# Patient Record
Sex: Female | Born: 1940
Health system: Southern US, Community
[De-identification: ages and names within clinical notes are randomized; demographics above are authoritative.]

## PROBLEM LIST (undated history)

## (undated) DIAGNOSIS — A0472 Enterocolitis due to Clostridium difficile, not specified as recurrent: Secondary | ICD-10-CM

## (undated) DIAGNOSIS — M810 Age-related osteoporosis without current pathological fracture: Secondary | ICD-10-CM

## (undated) DIAGNOSIS — D649 Anemia, unspecified: Secondary | ICD-10-CM

## (undated) DIAGNOSIS — R0600 Dyspnea, unspecified: Secondary | ICD-10-CM

## (undated) DIAGNOSIS — K759 Inflammatory liver disease, unspecified: Secondary | ICD-10-CM

## (undated) DIAGNOSIS — E785 Hyperlipidemia, unspecified: Secondary | ICD-10-CM

## (undated) DIAGNOSIS — T451X5A Adverse effect of antineoplastic and immunosuppressive drugs, initial encounter: Secondary | ICD-10-CM

## (undated) DIAGNOSIS — N390 Urinary tract infection, site not specified: Secondary | ICD-10-CM

## (undated) DIAGNOSIS — C50919 Malignant neoplasm of unspecified site of unspecified female breast: Secondary | ICD-10-CM

## (undated) DIAGNOSIS — Z923 Personal history of irradiation: Secondary | ICD-10-CM

## (undated) DIAGNOSIS — F329 Major depressive disorder, single episode, unspecified: Secondary | ICD-10-CM

## (undated) DIAGNOSIS — J449 Chronic obstructive pulmonary disease, unspecified: Secondary | ICD-10-CM

## (undated) DIAGNOSIS — M1990 Unspecified osteoarthritis, unspecified site: Secondary | ICD-10-CM

## (undated) DIAGNOSIS — G4700 Insomnia, unspecified: Secondary | ICD-10-CM

## (undated) DIAGNOSIS — F32A Depression, unspecified: Secondary | ICD-10-CM

## (undated) DIAGNOSIS — I10 Essential (primary) hypertension: Secondary | ICD-10-CM

## (undated) DIAGNOSIS — H269 Unspecified cataract: Secondary | ICD-10-CM

## (undated) DIAGNOSIS — F419 Anxiety disorder, unspecified: Secondary | ICD-10-CM

## (undated) DIAGNOSIS — G620 Drug-induced polyneuropathy: Secondary | ICD-10-CM

## (undated) HISTORY — DX: Essential (primary) hypertension: I10

## (undated) HISTORY — DX: Age-related osteoporosis without current pathological fracture: M81.0

## (undated) HISTORY — DX: Urinary tract infection, site not specified: N39.0

## (undated) HISTORY — PX: APPENDECTOMY: SHX54

## (undated) HISTORY — DX: Depression, unspecified: F32.A

## (undated) HISTORY — PX: MASTECTOMY: SHX3

## (undated) HISTORY — DX: Malignant neoplasm of unspecified site of unspecified female breast: C50.919

## (undated) HISTORY — DX: Major depressive disorder, single episode, unspecified: F32.9

## (undated) HISTORY — DX: Unspecified osteoarthritis, unspecified site: M19.90

## (undated) HISTORY — DX: Hyperlipidemia, unspecified: E78.5

## (undated) HISTORY — PX: EYE SURGERY: SHX253

## (undated) HISTORY — DX: Enterocolitis due to Clostridium difficile, not specified as recurrent: A04.72

## (undated) HISTORY — DX: Insomnia, unspecified: G47.00

## (undated) HISTORY — DX: Unspecified cataract: H26.9

---

## 1988-10-30 DIAGNOSIS — C50919 Malignant neoplasm of unspecified site of unspecified female breast: Secondary | ICD-10-CM

## 1988-10-30 HISTORY — PX: BREAST SURGERY: SHX581

## 1988-10-30 HISTORY — DX: Malignant neoplasm of unspecified site of unspecified female breast: C50.919

## 2011-06-01 DIAGNOSIS — F3340 Major depressive disorder, recurrent, in remission, unspecified: Secondary | ICD-10-CM | POA: Insufficient documentation

## 2011-06-01 DIAGNOSIS — F330 Major depressive disorder, recurrent, mild: Secondary | ICD-10-CM | POA: Insufficient documentation

## 2012-11-04 DIAGNOSIS — Z853 Personal history of malignant neoplasm of breast: Secondary | ICD-10-CM | POA: Insufficient documentation

## 2013-02-12 DIAGNOSIS — Z9889 Other specified postprocedural states: Secondary | ICD-10-CM | POA: Insufficient documentation

## 2014-03-26 ENCOUNTER — Ambulatory Visit: Payer: Self-pay | Admitting: Family Medicine

## 2014-07-27 ENCOUNTER — Ambulatory Visit: Payer: Self-pay | Admitting: Family Medicine

## 2014-08-17 ENCOUNTER — Ambulatory Visit: Payer: Self-pay | Admitting: Surgery

## 2014-08-25 ENCOUNTER — Ambulatory Visit: Payer: Self-pay | Admitting: Urology

## 2014-09-01 LAB — PATHOLOGY REPORT

## 2014-09-09 ENCOUNTER — Ambulatory Visit: Payer: Self-pay | Admitting: Internal Medicine

## 2014-09-09 LAB — PROTIME-INR
INR: 0.9
Prothrombin Time: 12.4 secs (ref 11.5–14.7)

## 2014-09-09 LAB — COMPREHENSIVE METABOLIC PANEL
ALK PHOS: 91 U/L
ALT: 22 U/L
ANION GAP: 7 (ref 7–16)
AST: 11 U/L — AB (ref 15–37)
Albumin: 4.1 g/dL (ref 3.4–5.0)
BUN: 18 mg/dL (ref 7–18)
Bilirubin,Total: 0.4 mg/dL (ref 0.2–1.0)
CREATININE: 0.72 mg/dL (ref 0.60–1.30)
Calcium, Total: 8.8 mg/dL (ref 8.5–10.1)
Chloride: 107 mmol/L (ref 98–107)
Co2: 27 mmol/L (ref 21–32)
EGFR (African American): >60
Glucose: 105 mg/dL — ABNORMAL HIGH (ref 65–99)
OSMOLALITY: 284 (ref 275–301)
Potassium: 4.4 mmol/L (ref 3.5–5.1)
Sodium: 141 mmol/L (ref 136–145)
Total Protein: 7.6 g/dL (ref 6.4–8.2)

## 2014-09-09 LAB — CBC CANCER CENTER
Basophil #: 0.1 x10 3/mm (ref 0.0–0.1)
Basophil %: 0.7 %
Eosinophil #: 0.2 x10 3/mm (ref 0.0–0.7)
Eosinophil %: 2.4 %
HCT: 40.8 % (ref 35.0–47.0)
HGB: 13.6 g/dL (ref 12.0–16.0)
Lymphocyte #: 1.8 x10 3/mm (ref 1.0–3.6)
Lymphocyte %: 21.4 %
MCH: 32.3 pg (ref 26.0–34.0)
MCHC: 33.4 g/dL (ref 32.0–36.0)
MCV: 97 fL (ref 80–100)
Monocyte #: 0.6 x10 3/mm (ref 0.2–0.9)
Monocyte %: 7.6 %
Neutrophil #: 5.7 x10 3/mm (ref 1.4–6.5)
Neutrophil %: 67.9 %
Platelet: 263 x10 3/mm (ref 150–440)
RBC: 4.22 10*6/uL (ref 3.80–5.20)
RDW: 13 % (ref 11.5–14.5)
WBC: 8.3 x10 3/mm (ref 3.6–11.0)

## 2014-09-09 LAB — APTT: ACTIVATED PTT: 26.7 s (ref 23.6–35.9)

## 2014-09-10 LAB — CANCER ANTIGEN 27.29: CA 27.29: 25.2 U/mL (ref 0.0–38.6)

## 2014-09-17 ENCOUNTER — Ambulatory Visit: Payer: Self-pay | Admitting: Anesthesiology

## 2014-09-17 ENCOUNTER — Ambulatory Visit: Payer: Self-pay | Admitting: Internal Medicine

## 2014-09-21 ENCOUNTER — Ambulatory Visit: Payer: Self-pay | Admitting: Surgery

## 2014-09-28 LAB — CBC CANCER CENTER
BASOS PCT: 0.6 %
Basophil #: 0.1 x10 3/mm (ref 0.0–0.1)
EOS PCT: 1.4 %
Eosinophil #: 0.1 x10 3/mm (ref 0.0–0.7)
HCT: 40.1 % (ref 35.0–47.0)
HGB: 13.5 g/dL (ref 12.0–16.0)
Lymphocyte #: 2.1 x10 3/mm (ref 1.0–3.6)
Lymphocyte %: 21.9 %
MCH: 32.2 pg (ref 26.0–34.0)
MCHC: 33.6 g/dL (ref 32.0–36.0)
MCV: 96 fL (ref 80–100)
Monocyte #: 0.9 x10 3/mm (ref 0.2–0.9)
Monocyte %: 9 %
NEUTROS ABS: 6.5 x10 3/mm (ref 1.4–6.5)
NEUTROS PCT: 67.1 %
Platelet: 308 x10 3/mm (ref 150–440)
RBC: 4.19 10*6/uL (ref 3.80–5.20)
RDW: 13.3 % (ref 11.5–14.5)
WBC: 9.7 x10 3/mm (ref 3.6–11.0)

## 2014-09-28 LAB — CREATININE, SERUM
CREATININE: 0.81 mg/dL (ref 0.60–1.30)
EGFR (African American): >60

## 2014-09-28 LAB — HEPATIC FUNCTION PANEL A (ARMC)
AST: 13 U/L — AB (ref 15–37)
Albumin: 4 g/dL (ref 3.4–5.0)
Alkaline Phosphatase: 90 U/L
BILIRUBIN DIRECT: 0.1 mg/dL (ref 0.0–0.2)
Bilirubin,Total: 0.4 mg/dL (ref 0.2–1.0)
SGPT (ALT): 24 U/L
Total Protein: 7.3 g/dL (ref 6.4–8.2)

## 2014-09-29 ENCOUNTER — Ambulatory Visit: Payer: Self-pay | Admitting: Internal Medicine

## 2014-10-05 LAB — CBC CANCER CENTER
Basophil #: 0 x10 3/mm (ref 0.0–0.1)
Basophil %: 1.8 %
Eosinophil #: 0.1 x10 3/mm (ref 0.0–0.7)
Eosinophil %: 11 %
HCT: 36.1 % (ref 35.0–47.0)
HGB: 12 g/dL (ref 12.0–16.0)
Lymphocyte #: 0.6 x10 3/mm — ABNORMAL LOW (ref 1.0–3.6)
Lymphocyte %: 66 %
MCH: 31.7 pg (ref 26.0–34.0)
MCHC: 33.1 g/dL (ref 32.0–36.0)
MCV: 96 fL (ref 80–100)
MONO ABS: 0 x10 3/mm — AB (ref 0.2–0.9)
MONOS PCT: 4 %
NEUTROS ABS: 0.1 x10 3/mm — AB (ref 1.4–6.5)
Neutrophil %: 17.2 %
PLATELETS: 108 x10 3/mm — AB (ref 150–440)
RBC: 3.77 10*6/uL — ABNORMAL LOW (ref 3.80–5.20)
RDW: 12.6 % (ref 11.5–14.5)
WBC: 0.9 x10 3/mm — AB (ref 3.6–11.0)

## 2014-10-12 LAB — CBC CANCER CENTER
BASOS ABS: 0 x10 3/mm (ref 0.0–0.1)
BASOS PCT: 0.4 %
Eosinophil #: 0 x10 3/mm (ref 0.0–0.7)
Eosinophil %: 0.4 %
HCT: 36 % (ref 35.0–47.0)
HGB: 11.8 g/dL — ABNORMAL LOW (ref 12.0–16.0)
LYMPHS PCT: 17.7 %
Lymphocyte #: 1.3 x10 3/mm (ref 1.0–3.6)
MCH: 32 pg (ref 26.0–34.0)
MCHC: 32.7 g/dL (ref 32.0–36.0)
MCV: 98 fL (ref 80–100)
Monocyte #: 0.6 x10 3/mm (ref 0.2–0.9)
Monocyte %: 8.6 %
NEUTROS PCT: 72.9 %
Neutrophil #: 5.4 x10 3/mm (ref 1.4–6.5)
PLATELETS: 320 x10 3/mm (ref 150–440)
RBC: 3.68 10*6/uL — AB (ref 3.80–5.20)
RDW: 12.5 % (ref 11.5–14.5)
WBC: 7.4 x10 3/mm (ref 3.6–11.0)

## 2014-10-12 LAB — BASIC METABOLIC PANEL
Anion Gap: 6 — ABNORMAL LOW (ref 7–16)
BUN: 12 mg/dL (ref 7–18)
CALCIUM: 8.5 mg/dL (ref 8.5–10.1)
CO2: 27 mmol/L (ref 21–32)
Chloride: 107 mmol/L (ref 98–107)
Creatinine: 0.81 mg/dL (ref 0.60–1.30)
EGFR (African American): >60
Glucose: 111 mg/dL — ABNORMAL HIGH (ref 65–99)
OSMOLALITY: 280 (ref 275–301)
POTASSIUM: 4 mmol/L (ref 3.5–5.1)
SODIUM: 140 mmol/L (ref 136–145)

## 2014-10-12 LAB — HEPATIC FUNCTION PANEL A (ARMC)
ALBUMIN: 3.7 g/dL (ref 3.4–5.0)
ALK PHOS: 88 U/L
ALT: 24 U/L
BILIRUBIN TOTAL: 0.2 mg/dL (ref 0.2–1.0)
Bilirubin, Direct: <0.1 mg/dL (ref 0.0–0.2)
SGOT(AST): 12 U/L — ABNORMAL LOW (ref 15–37)
TOTAL PROTEIN: 6.7 g/dL (ref 6.4–8.2)

## 2014-10-19 LAB — CBC CANCER CENTER
BASOS PCT: 1 %
Basophil #: 0.1 x10 3/mm (ref 0.0–0.1)
EOS ABS: 0 x10 3/mm (ref 0.0–0.7)
Eosinophil %: 0.3 %
HCT: 36.1 % (ref 35.0–47.0)
HGB: 12 g/dL (ref 12.0–16.0)
LYMPHS PCT: 22.3 %
Lymphocyte #: 1.4 x10 3/mm (ref 1.0–3.6)
MCH: 31.9 pg (ref 26.0–34.0)
MCHC: 33.3 g/dL (ref 32.0–36.0)
MCV: 96 fL (ref 80–100)
MONO ABS: 0.6 x10 3/mm (ref 0.2–0.9)
Monocyte %: 10.3 %
NEUTROS PCT: 66.1 %
Neutrophil #: 4.1 x10 3/mm (ref 1.4–6.5)
Platelet: 397 x10 3/mm (ref 150–440)
RBC: 3.76 10*6/uL — AB (ref 3.80–5.20)
RDW: 13.1 % (ref 11.5–14.5)
WBC: 6.2 x10 3/mm (ref 3.6–11.0)

## 2014-10-26 LAB — CBC CANCER CENTER
BASOS PCT: 2.4 %
Basophil #: 0 x10 3/mm (ref 0.0–0.1)
EOS PCT: 3.8 %
Eosinophil #: 0 x10 3/mm (ref 0.0–0.7)
HCT: 33.7 % — AB (ref 35.0–47.0)
HGB: 11.1 g/dL — ABNORMAL LOW (ref 12.0–16.0)
Lymphocyte #: 0.6 x10 3/mm — ABNORMAL LOW (ref 1.0–3.6)
Lymphocyte %: 57.8 %
MCH: 31.9 pg (ref 26.0–34.0)
MCHC: 33.1 g/dL (ref 32.0–36.0)
MCV: 96 fL (ref 80–100)
Monocyte #: 0.1 x10 3/mm — ABNORMAL LOW (ref 0.2–0.9)
Monocyte %: 6.1 %
Neutrophil #: 0.3 x10 3/mm — ABNORMAL LOW (ref 1.4–6.5)
Neutrophil %: 29.9 %
PLATELETS: 165 x10 3/mm (ref 150–440)
RBC: 3.5 10*6/uL — AB (ref 3.80–5.20)
RDW: 13.5 % (ref 11.5–14.5)
WBC: 1.1 x10 3/mm — AB (ref 3.6–11.0)

## 2014-10-30 ENCOUNTER — Ambulatory Visit: Payer: Self-pay | Admitting: Internal Medicine

## 2014-10-30 DIAGNOSIS — Z923 Personal history of irradiation: Secondary | ICD-10-CM

## 2014-10-30 HISTORY — DX: Personal history of irradiation: Z92.3

## 2014-10-30 HISTORY — PX: BREAST LUMPECTOMY: SHX2

## 2014-11-02 DIAGNOSIS — Z170 Estrogen receptor positive status [ER+]: Secondary | ICD-10-CM | POA: Diagnosis not present

## 2014-11-02 DIAGNOSIS — T451X5S Adverse effect of antineoplastic and immunosuppressive drugs, sequela: Secondary | ICD-10-CM | POA: Diagnosis not present

## 2014-11-02 DIAGNOSIS — Z79899 Other long term (current) drug therapy: Secondary | ICD-10-CM | POA: Diagnosis not present

## 2014-11-02 DIAGNOSIS — I10 Essential (primary) hypertension: Secondary | ICD-10-CM | POA: Diagnosis not present

## 2014-11-02 DIAGNOSIS — D649 Anemia, unspecified: Secondary | ICD-10-CM | POA: Diagnosis not present

## 2014-11-02 DIAGNOSIS — Z5111 Encounter for antineoplastic chemotherapy: Secondary | ICD-10-CM | POA: Diagnosis not present

## 2014-11-02 DIAGNOSIS — Z9011 Acquired absence of right breast and nipple: Secondary | ICD-10-CM | POA: Diagnosis not present

## 2014-11-02 DIAGNOSIS — Z853 Personal history of malignant neoplasm of breast: Secondary | ICD-10-CM | POA: Diagnosis not present

## 2014-11-02 DIAGNOSIS — M170 Bilateral primary osteoarthritis of knee: Secondary | ICD-10-CM | POA: Diagnosis not present

## 2014-11-02 DIAGNOSIS — D701 Agranulocytosis secondary to cancer chemotherapy: Secondary | ICD-10-CM | POA: Diagnosis not present

## 2014-11-02 DIAGNOSIS — E785 Hyperlipidemia, unspecified: Secondary | ICD-10-CM | POA: Diagnosis not present

## 2014-11-02 DIAGNOSIS — Z87891 Personal history of nicotine dependence: Secondary | ICD-10-CM | POA: Diagnosis not present

## 2014-11-02 DIAGNOSIS — C773 Secondary and unspecified malignant neoplasm of axilla and upper limb lymph nodes: Secondary | ICD-10-CM | POA: Diagnosis not present

## 2014-11-02 DIAGNOSIS — Z9221 Personal history of antineoplastic chemotherapy: Secondary | ICD-10-CM | POA: Diagnosis not present

## 2014-11-02 DIAGNOSIS — C50212 Malignant neoplasm of upper-inner quadrant of left female breast: Secondary | ICD-10-CM | POA: Diagnosis not present

## 2014-11-02 LAB — HEPATIC FUNCTION PANEL A (ARMC)
ALBUMIN: 3.9 g/dL (ref 3.4–5.0)
ALK PHOS: 92 U/L
BILIRUBIN DIRECT: 0.1 mg/dL (ref 0.0–0.2)
Bilirubin,Total: 0.2 mg/dL (ref 0.2–1.0)
SGOT(AST): 3 U/L — ABNORMAL LOW (ref 15–37)
SGPT (ALT): 10 U/L — ABNORMAL LOW
Total Protein: 7.1 g/dL (ref 6.4–8.2)

## 2014-11-02 LAB — CBC CANCER CENTER
Basophil #: 0.1 x10 3/mm (ref 0.0–0.1)
Basophil %: 0.8 %
EOS PCT: 1.4 %
Eosinophil #: 0.1 x10 3/mm (ref 0.0–0.7)
HCT: 36.1 % (ref 35.0–47.0)
HGB: 11.9 g/dL — ABNORMAL LOW (ref 12.0–16.0)
LYMPHS ABS: 1.5 x10 3/mm (ref 1.0–3.6)
LYMPHS PCT: 19.4 %
MCH: 31.9 pg (ref 26.0–34.0)
MCHC: 33.1 g/dL (ref 32.0–36.0)
MCV: 97 fL (ref 80–100)
MONOS PCT: 11.1 %
Monocyte #: 0.9 x10 3/mm (ref 0.2–0.9)
Neutrophil #: 5.2 x10 3/mm (ref 1.4–6.5)
Neutrophil %: 67.3 %
Platelet: 236 x10 3/mm (ref 150–440)
RBC: 3.74 10*6/uL — AB (ref 3.80–5.20)
RDW: 14 % (ref 11.5–14.5)
WBC: 7.7 x10 3/mm (ref 3.6–11.0)

## 2014-11-02 LAB — CREATININE, SERUM
CREATININE: 0.87 mg/dL (ref 0.60–1.30)
EGFR (African American): >60
EGFR (Non-African Amer.): >60

## 2014-11-09 DIAGNOSIS — Z170 Estrogen receptor positive status [ER+]: Secondary | ICD-10-CM | POA: Diagnosis not present

## 2014-11-09 DIAGNOSIS — Z9221 Personal history of antineoplastic chemotherapy: Secondary | ICD-10-CM | POA: Diagnosis not present

## 2014-11-09 DIAGNOSIS — Z79899 Other long term (current) drug therapy: Secondary | ICD-10-CM | POA: Diagnosis not present

## 2014-11-09 DIAGNOSIS — C773 Secondary and unspecified malignant neoplasm of axilla and upper limb lymph nodes: Secondary | ICD-10-CM | POA: Diagnosis not present

## 2014-11-09 DIAGNOSIS — Z5111 Encounter for antineoplastic chemotherapy: Secondary | ICD-10-CM | POA: Diagnosis not present

## 2014-11-09 DIAGNOSIS — C50212 Malignant neoplasm of upper-inner quadrant of left female breast: Secondary | ICD-10-CM | POA: Diagnosis not present

## 2014-11-09 DIAGNOSIS — M170 Bilateral primary osteoarthritis of knee: Secondary | ICD-10-CM | POA: Diagnosis not present

## 2014-11-09 DIAGNOSIS — D701 Agranulocytosis secondary to cancer chemotherapy: Secondary | ICD-10-CM | POA: Diagnosis not present

## 2014-11-09 DIAGNOSIS — T451X5S Adverse effect of antineoplastic and immunosuppressive drugs, sequela: Secondary | ICD-10-CM | POA: Diagnosis not present

## 2014-11-09 DIAGNOSIS — D649 Anemia, unspecified: Secondary | ICD-10-CM | POA: Diagnosis not present

## 2014-11-09 DIAGNOSIS — I10 Essential (primary) hypertension: Secondary | ICD-10-CM | POA: Diagnosis not present

## 2014-11-09 DIAGNOSIS — Z853 Personal history of malignant neoplasm of breast: Secondary | ICD-10-CM | POA: Diagnosis not present

## 2014-11-09 DIAGNOSIS — E785 Hyperlipidemia, unspecified: Secondary | ICD-10-CM | POA: Diagnosis not present

## 2014-11-09 DIAGNOSIS — Z9011 Acquired absence of right breast and nipple: Secondary | ICD-10-CM | POA: Diagnosis not present

## 2014-11-09 DIAGNOSIS — Z87891 Personal history of nicotine dependence: Secondary | ICD-10-CM | POA: Diagnosis not present

## 2014-11-09 LAB — CBC CANCER CENTER
BASOS ABS: 0.1 x10 3/mm (ref 0.0–0.1)
Basophil %: 1.2 %
Eosinophil #: 0 x10 3/mm (ref 0.0–0.7)
Eosinophil %: 0.5 %
HCT: 35.4 % (ref 35.0–47.0)
HGB: 11.7 g/dL — ABNORMAL LOW (ref 12.0–16.0)
LYMPHS ABS: 1 x10 3/mm (ref 1.0–3.6)
Lymphocyte %: 17.8 %
MCH: 32.2 pg (ref 26.0–34.0)
MCHC: 33.2 g/dL (ref 32.0–36.0)
MCV: 97 fL (ref 80–100)
MONO ABS: 0.9 x10 3/mm (ref 0.2–0.9)
MONOS PCT: 15.5 %
NEUTROS PCT: 65 %
Neutrophil #: 3.8 x10 3/mm (ref 1.4–6.5)
Platelet: 388 x10 3/mm (ref 150–440)
RBC: 3.64 10*6/uL — AB (ref 3.80–5.20)
RDW: 15.2 % — AB (ref 11.5–14.5)
WBC: 5.9 x10 3/mm (ref 3.6–11.0)

## 2014-11-10 DIAGNOSIS — M170 Bilateral primary osteoarthritis of knee: Secondary | ICD-10-CM | POA: Diagnosis not present

## 2014-11-10 DIAGNOSIS — I10 Essential (primary) hypertension: Secondary | ICD-10-CM | POA: Diagnosis not present

## 2014-11-10 DIAGNOSIS — T451X5S Adverse effect of antineoplastic and immunosuppressive drugs, sequela: Secondary | ICD-10-CM | POA: Diagnosis not present

## 2014-11-10 DIAGNOSIS — Z9221 Personal history of antineoplastic chemotherapy: Secondary | ICD-10-CM | POA: Diagnosis not present

## 2014-11-10 DIAGNOSIS — D649 Anemia, unspecified: Secondary | ICD-10-CM | POA: Diagnosis not present

## 2014-11-10 DIAGNOSIS — E785 Hyperlipidemia, unspecified: Secondary | ICD-10-CM | POA: Diagnosis not present

## 2014-11-10 DIAGNOSIS — D701 Agranulocytosis secondary to cancer chemotherapy: Secondary | ICD-10-CM | POA: Diagnosis not present

## 2014-11-10 DIAGNOSIS — Z170 Estrogen receptor positive status [ER+]: Secondary | ICD-10-CM | POA: Diagnosis not present

## 2014-11-10 DIAGNOSIS — Z5111 Encounter for antineoplastic chemotherapy: Secondary | ICD-10-CM | POA: Diagnosis not present

## 2014-11-10 DIAGNOSIS — Z9011 Acquired absence of right breast and nipple: Secondary | ICD-10-CM | POA: Diagnosis not present

## 2014-11-10 DIAGNOSIS — C773 Secondary and unspecified malignant neoplasm of axilla and upper limb lymph nodes: Secondary | ICD-10-CM | POA: Diagnosis not present

## 2014-11-10 DIAGNOSIS — Z79899 Other long term (current) drug therapy: Secondary | ICD-10-CM | POA: Diagnosis not present

## 2014-11-10 DIAGNOSIS — Z87891 Personal history of nicotine dependence: Secondary | ICD-10-CM | POA: Diagnosis not present

## 2014-11-10 DIAGNOSIS — C50212 Malignant neoplasm of upper-inner quadrant of left female breast: Secondary | ICD-10-CM | POA: Diagnosis not present

## 2014-11-10 DIAGNOSIS — Z853 Personal history of malignant neoplasm of breast: Secondary | ICD-10-CM | POA: Diagnosis not present

## 2014-11-16 DIAGNOSIS — Z9221 Personal history of antineoplastic chemotherapy: Secondary | ICD-10-CM | POA: Diagnosis not present

## 2014-11-16 DIAGNOSIS — T451X5S Adverse effect of antineoplastic and immunosuppressive drugs, sequela: Secondary | ICD-10-CM | POA: Diagnosis not present

## 2014-11-16 DIAGNOSIS — D649 Anemia, unspecified: Secondary | ICD-10-CM | POA: Diagnosis not present

## 2014-11-16 DIAGNOSIS — Z170 Estrogen receptor positive status [ER+]: Secondary | ICD-10-CM | POA: Diagnosis not present

## 2014-11-16 DIAGNOSIS — Z9011 Acquired absence of right breast and nipple: Secondary | ICD-10-CM | POA: Diagnosis not present

## 2014-11-16 DIAGNOSIS — Z87891 Personal history of nicotine dependence: Secondary | ICD-10-CM | POA: Diagnosis not present

## 2014-11-16 DIAGNOSIS — C773 Secondary and unspecified malignant neoplasm of axilla and upper limb lymph nodes: Secondary | ICD-10-CM | POA: Diagnosis not present

## 2014-11-16 DIAGNOSIS — Z853 Personal history of malignant neoplasm of breast: Secondary | ICD-10-CM | POA: Diagnosis not present

## 2014-11-16 DIAGNOSIS — E785 Hyperlipidemia, unspecified: Secondary | ICD-10-CM | POA: Diagnosis not present

## 2014-11-16 DIAGNOSIS — M170 Bilateral primary osteoarthritis of knee: Secondary | ICD-10-CM | POA: Diagnosis not present

## 2014-11-16 DIAGNOSIS — I10 Essential (primary) hypertension: Secondary | ICD-10-CM | POA: Diagnosis not present

## 2014-11-16 DIAGNOSIS — C50212 Malignant neoplasm of upper-inner quadrant of left female breast: Secondary | ICD-10-CM | POA: Diagnosis not present

## 2014-11-16 DIAGNOSIS — D701 Agranulocytosis secondary to cancer chemotherapy: Secondary | ICD-10-CM | POA: Diagnosis not present

## 2014-11-16 DIAGNOSIS — Z79899 Other long term (current) drug therapy: Secondary | ICD-10-CM | POA: Diagnosis not present

## 2014-11-16 DIAGNOSIS — Z5111 Encounter for antineoplastic chemotherapy: Secondary | ICD-10-CM | POA: Diagnosis not present

## 2014-11-16 LAB — CBC CANCER CENTER
Basophil #: 0 x10 3/mm (ref 0.0–0.1)
Basophil %: 2.4 %
EOS PCT: 5.8 %
Eosinophil #: 0 x10 3/mm (ref 0.0–0.7)
HCT: 29.5 % — AB (ref 35.0–47.0)
HGB: 9.9 g/dL — AB (ref 12.0–16.0)
Lymphocyte #: 0.5 x10 3/mm — ABNORMAL LOW (ref 1.0–3.6)
Lymphocyte %: 63.7 %
MCH: 33 pg (ref 26.0–34.0)
MCHC: 33.6 g/dL (ref 32.0–36.0)
MCV: 98 fL (ref 80–100)
MONO ABS: 0 x10 3/mm — AB (ref 0.2–0.9)
Monocyte %: 5.8 %
NEUTROS ABS: 0.2 x10 3/mm — AB (ref 1.4–6.5)
NEUTROS PCT: 22.3 %
Platelet: 131 x10 3/mm — ABNORMAL LOW (ref 150–440)
RBC: 2.99 10*6/uL — ABNORMAL LOW (ref 3.80–5.20)
RDW: 14.9 % — ABNORMAL HIGH (ref 11.5–14.5)
WBC: 0.9 x10 3/mm — CL (ref 3.6–11.0)

## 2014-11-23 DIAGNOSIS — C773 Secondary and unspecified malignant neoplasm of axilla and upper limb lymph nodes: Secondary | ICD-10-CM | POA: Diagnosis not present

## 2014-11-23 DIAGNOSIS — Z79899 Other long term (current) drug therapy: Secondary | ICD-10-CM | POA: Diagnosis not present

## 2014-11-23 DIAGNOSIS — Z853 Personal history of malignant neoplasm of breast: Secondary | ICD-10-CM | POA: Diagnosis not present

## 2014-11-23 DIAGNOSIS — Z9011 Acquired absence of right breast and nipple: Secondary | ICD-10-CM | POA: Diagnosis not present

## 2014-11-23 DIAGNOSIS — D649 Anemia, unspecified: Secondary | ICD-10-CM | POA: Diagnosis not present

## 2014-11-23 DIAGNOSIS — M170 Bilateral primary osteoarthritis of knee: Secondary | ICD-10-CM | POA: Diagnosis not present

## 2014-11-23 DIAGNOSIS — Z87891 Personal history of nicotine dependence: Secondary | ICD-10-CM | POA: Diagnosis not present

## 2014-11-23 DIAGNOSIS — D701 Agranulocytosis secondary to cancer chemotherapy: Secondary | ICD-10-CM | POA: Diagnosis not present

## 2014-11-23 DIAGNOSIS — Z5111 Encounter for antineoplastic chemotherapy: Secondary | ICD-10-CM | POA: Diagnosis not present

## 2014-11-23 DIAGNOSIS — C50212 Malignant neoplasm of upper-inner quadrant of left female breast: Secondary | ICD-10-CM | POA: Diagnosis not present

## 2014-11-23 DIAGNOSIS — I10 Essential (primary) hypertension: Secondary | ICD-10-CM | POA: Diagnosis not present

## 2014-11-23 DIAGNOSIS — Z170 Estrogen receptor positive status [ER+]: Secondary | ICD-10-CM | POA: Diagnosis not present

## 2014-11-23 DIAGNOSIS — T451X5S Adverse effect of antineoplastic and immunosuppressive drugs, sequela: Secondary | ICD-10-CM | POA: Diagnosis not present

## 2014-11-23 DIAGNOSIS — E785 Hyperlipidemia, unspecified: Secondary | ICD-10-CM | POA: Diagnosis not present

## 2014-11-23 DIAGNOSIS — Z9221 Personal history of antineoplastic chemotherapy: Secondary | ICD-10-CM | POA: Diagnosis not present

## 2014-11-23 LAB — CBC CANCER CENTER
Basophil #: 0.1 x10 3/mm (ref 0.0–0.1)
Basophil %: 0.6 %
EOS PCT: 0.7 %
Eosinophil #: 0.1 x10 3/mm (ref 0.0–0.7)
HCT: 32.7 % — AB (ref 35.0–47.0)
HGB: 10.9 g/dL — ABNORMAL LOW (ref 12.0–16.0)
LYMPHS PCT: 12.5 %
Lymphocyte #: 1.1 x10 3/mm (ref 1.0–3.6)
MCH: 32.8 pg (ref 26.0–34.0)
MCHC: 33.3 g/dL (ref 32.0–36.0)
MCV: 98 fL (ref 80–100)
MONOS PCT: 11.4 %
Monocyte #: 1 x10 3/mm — ABNORMAL HIGH (ref 0.2–0.9)
NEUTROS ABS: 6.9 x10 3/mm — AB (ref 1.4–6.5)
Neutrophil %: 74.8 %
Platelet: 297 x10 3/mm (ref 150–440)
RBC: 3.32 10*6/uL — ABNORMAL LOW (ref 3.80–5.20)
RDW: 15.7 % — ABNORMAL HIGH (ref 11.5–14.5)
WBC: 9.2 x10 3/mm (ref 3.6–11.0)

## 2014-11-23 LAB — HEPATIC FUNCTION PANEL A (ARMC)
ALBUMIN: 3.7 g/dL (ref 3.4–5.0)
ALK PHOS: 85 U/L
BILIRUBIN DIRECT: 0.1 mg/dL (ref 0.0–0.2)
BILIRUBIN TOTAL: 0.3 mg/dL (ref 0.2–1.0)
SGOT(AST): 10 U/L — ABNORMAL LOW (ref 15–37)
SGPT (ALT): 22 U/L
TOTAL PROTEIN: 7 g/dL (ref 6.4–8.2)

## 2014-11-23 LAB — CREATININE, SERUM
CREATININE: 0.75 mg/dL (ref 0.60–1.30)
EGFR (African American): >60
EGFR (Non-African Amer.): >60

## 2014-11-30 ENCOUNTER — Ambulatory Visit: Payer: Self-pay | Admitting: Internal Medicine

## 2014-12-02 DIAGNOSIS — Z853 Personal history of malignant neoplasm of breast: Secondary | ICD-10-CM | POA: Diagnosis not present

## 2014-12-02 DIAGNOSIS — C50212 Malignant neoplasm of upper-inner quadrant of left female breast: Secondary | ICD-10-CM | POA: Diagnosis not present

## 2014-12-02 DIAGNOSIS — Z9221 Personal history of antineoplastic chemotherapy: Secondary | ICD-10-CM | POA: Diagnosis not present

## 2014-12-02 DIAGNOSIS — D649 Anemia, unspecified: Secondary | ICD-10-CM | POA: Diagnosis not present

## 2014-12-02 DIAGNOSIS — T451X5S Adverse effect of antineoplastic and immunosuppressive drugs, sequela: Secondary | ICD-10-CM | POA: Diagnosis not present

## 2014-12-02 DIAGNOSIS — Z79899 Other long term (current) drug therapy: Secondary | ICD-10-CM | POA: Diagnosis not present

## 2014-12-02 DIAGNOSIS — C773 Secondary and unspecified malignant neoplasm of axilla and upper limb lymph nodes: Secondary | ICD-10-CM | POA: Diagnosis not present

## 2014-12-02 DIAGNOSIS — M170 Bilateral primary osteoarthritis of knee: Secondary | ICD-10-CM | POA: Diagnosis not present

## 2014-12-02 DIAGNOSIS — I10 Essential (primary) hypertension: Secondary | ICD-10-CM | POA: Diagnosis not present

## 2014-12-02 DIAGNOSIS — Z5111 Encounter for antineoplastic chemotherapy: Secondary | ICD-10-CM | POA: Diagnosis not present

## 2014-12-02 DIAGNOSIS — K1231 Oral mucositis (ulcerative) due to antineoplastic therapy: Secondary | ICD-10-CM | POA: Diagnosis not present

## 2014-12-02 DIAGNOSIS — Z170 Estrogen receptor positive status [ER+]: Secondary | ICD-10-CM | POA: Diagnosis not present

## 2014-12-02 DIAGNOSIS — Z9011 Acquired absence of right breast and nipple: Secondary | ICD-10-CM | POA: Diagnosis not present

## 2014-12-02 DIAGNOSIS — E785 Hyperlipidemia, unspecified: Secondary | ICD-10-CM | POA: Diagnosis not present

## 2014-12-02 DIAGNOSIS — D701 Agranulocytosis secondary to cancer chemotherapy: Secondary | ICD-10-CM | POA: Diagnosis not present

## 2014-12-02 DIAGNOSIS — Z87891 Personal history of nicotine dependence: Secondary | ICD-10-CM | POA: Diagnosis not present

## 2014-12-02 LAB — CBC CANCER CENTER
Basophil #: 0.1 x10 3/mm (ref 0.0–0.1)
Basophil %: 2 %
Eosinophil #: 0 x10 3/mm (ref 0.0–0.7)
Eosinophil %: 0.4 %
HCT: 34.9 % — ABNORMAL LOW (ref 35.0–47.0)
HGB: 11.7 g/dL — ABNORMAL LOW (ref 12.0–16.0)
Lymphocyte #: 1 x10 3/mm (ref 1.0–3.6)
Lymphocyte %: 15.5 %
MCH: 33.6 pg (ref 26.0–34.0)
MCHC: 33.5 g/dL (ref 32.0–36.0)
MCV: 101 fL — ABNORMAL HIGH (ref 80–100)
MONOS PCT: 13.5 %
Monocyte #: 0.9 x10 3/mm (ref 0.2–0.9)
Neutrophil #: 4.6 x10 3/mm (ref 1.4–6.5)
Neutrophil %: 68.6 %
PLATELETS: 423 x10 3/mm (ref 150–440)
RBC: 3.48 10*6/uL — AB (ref 3.80–5.20)
RDW: 17.1 % — ABNORMAL HIGH (ref 11.5–14.5)
WBC: 6.8 x10 3/mm (ref 3.6–11.0)

## 2014-12-03 DIAGNOSIS — D649 Anemia, unspecified: Secondary | ICD-10-CM | POA: Diagnosis not present

## 2014-12-03 DIAGNOSIS — Z853 Personal history of malignant neoplasm of breast: Secondary | ICD-10-CM | POA: Diagnosis not present

## 2014-12-03 DIAGNOSIS — D701 Agranulocytosis secondary to cancer chemotherapy: Secondary | ICD-10-CM | POA: Diagnosis not present

## 2014-12-03 DIAGNOSIS — M170 Bilateral primary osteoarthritis of knee: Secondary | ICD-10-CM | POA: Diagnosis not present

## 2014-12-03 DIAGNOSIS — C50212 Malignant neoplasm of upper-inner quadrant of left female breast: Secondary | ICD-10-CM | POA: Diagnosis not present

## 2014-12-03 DIAGNOSIS — Z5111 Encounter for antineoplastic chemotherapy: Secondary | ICD-10-CM | POA: Diagnosis not present

## 2014-12-03 DIAGNOSIS — Z170 Estrogen receptor positive status [ER+]: Secondary | ICD-10-CM | POA: Diagnosis not present

## 2014-12-03 DIAGNOSIS — Z9221 Personal history of antineoplastic chemotherapy: Secondary | ICD-10-CM | POA: Diagnosis not present

## 2014-12-03 DIAGNOSIS — I10 Essential (primary) hypertension: Secondary | ICD-10-CM | POA: Diagnosis not present

## 2014-12-03 DIAGNOSIS — T451X5S Adverse effect of antineoplastic and immunosuppressive drugs, sequela: Secondary | ICD-10-CM | POA: Diagnosis not present

## 2014-12-03 DIAGNOSIS — E785 Hyperlipidemia, unspecified: Secondary | ICD-10-CM | POA: Diagnosis not present

## 2014-12-03 DIAGNOSIS — Z79899 Other long term (current) drug therapy: Secondary | ICD-10-CM | POA: Diagnosis not present

## 2014-12-03 DIAGNOSIS — C773 Secondary and unspecified malignant neoplasm of axilla and upper limb lymph nodes: Secondary | ICD-10-CM | POA: Diagnosis not present

## 2014-12-03 DIAGNOSIS — Z87891 Personal history of nicotine dependence: Secondary | ICD-10-CM | POA: Diagnosis not present

## 2014-12-03 DIAGNOSIS — K1231 Oral mucositis (ulcerative) due to antineoplastic therapy: Secondary | ICD-10-CM | POA: Diagnosis not present

## 2014-12-03 DIAGNOSIS — Z9011 Acquired absence of right breast and nipple: Secondary | ICD-10-CM | POA: Diagnosis not present

## 2014-12-09 DIAGNOSIS — C50212 Malignant neoplasm of upper-inner quadrant of left female breast: Secondary | ICD-10-CM | POA: Diagnosis not present

## 2014-12-09 DIAGNOSIS — Z853 Personal history of malignant neoplasm of breast: Secondary | ICD-10-CM | POA: Diagnosis not present

## 2014-12-09 DIAGNOSIS — D649 Anemia, unspecified: Secondary | ICD-10-CM | POA: Diagnosis not present

## 2014-12-09 DIAGNOSIS — Z9221 Personal history of antineoplastic chemotherapy: Secondary | ICD-10-CM | POA: Diagnosis not present

## 2014-12-09 DIAGNOSIS — Z170 Estrogen receptor positive status [ER+]: Secondary | ICD-10-CM | POA: Diagnosis not present

## 2014-12-09 DIAGNOSIS — D701 Agranulocytosis secondary to cancer chemotherapy: Secondary | ICD-10-CM | POA: Diagnosis not present

## 2014-12-09 DIAGNOSIS — Z87891 Personal history of nicotine dependence: Secondary | ICD-10-CM | POA: Diagnosis not present

## 2014-12-09 DIAGNOSIS — K1231 Oral mucositis (ulcerative) due to antineoplastic therapy: Secondary | ICD-10-CM | POA: Diagnosis not present

## 2014-12-09 DIAGNOSIS — M170 Bilateral primary osteoarthritis of knee: Secondary | ICD-10-CM | POA: Diagnosis not present

## 2014-12-09 DIAGNOSIS — T451X5S Adverse effect of antineoplastic and immunosuppressive drugs, sequela: Secondary | ICD-10-CM | POA: Diagnosis not present

## 2014-12-09 DIAGNOSIS — I10 Essential (primary) hypertension: Secondary | ICD-10-CM | POA: Diagnosis not present

## 2014-12-09 DIAGNOSIS — Z5111 Encounter for antineoplastic chemotherapy: Secondary | ICD-10-CM | POA: Diagnosis not present

## 2014-12-09 DIAGNOSIS — E785 Hyperlipidemia, unspecified: Secondary | ICD-10-CM | POA: Diagnosis not present

## 2014-12-09 DIAGNOSIS — C773 Secondary and unspecified malignant neoplasm of axilla and upper limb lymph nodes: Secondary | ICD-10-CM | POA: Diagnosis not present

## 2014-12-09 DIAGNOSIS — Z9011 Acquired absence of right breast and nipple: Secondary | ICD-10-CM | POA: Diagnosis not present

## 2014-12-09 DIAGNOSIS — Z79899 Other long term (current) drug therapy: Secondary | ICD-10-CM | POA: Diagnosis not present

## 2014-12-15 DIAGNOSIS — D701 Agranulocytosis secondary to cancer chemotherapy: Secondary | ICD-10-CM | POA: Diagnosis not present

## 2014-12-15 DIAGNOSIS — Z170 Estrogen receptor positive status [ER+]: Secondary | ICD-10-CM | POA: Diagnosis not present

## 2014-12-15 DIAGNOSIS — Z9221 Personal history of antineoplastic chemotherapy: Secondary | ICD-10-CM | POA: Diagnosis not present

## 2014-12-15 DIAGNOSIS — Z853 Personal history of malignant neoplasm of breast: Secondary | ICD-10-CM | POA: Diagnosis not present

## 2014-12-15 DIAGNOSIS — I10 Essential (primary) hypertension: Secondary | ICD-10-CM | POA: Diagnosis not present

## 2014-12-15 DIAGNOSIS — M170 Bilateral primary osteoarthritis of knee: Secondary | ICD-10-CM | POA: Diagnosis not present

## 2014-12-15 DIAGNOSIS — T451X5S Adverse effect of antineoplastic and immunosuppressive drugs, sequela: Secondary | ICD-10-CM | POA: Diagnosis not present

## 2014-12-15 DIAGNOSIS — Z79899 Other long term (current) drug therapy: Secondary | ICD-10-CM | POA: Diagnosis not present

## 2014-12-15 DIAGNOSIS — Z87891 Personal history of nicotine dependence: Secondary | ICD-10-CM | POA: Diagnosis not present

## 2014-12-15 DIAGNOSIS — E785 Hyperlipidemia, unspecified: Secondary | ICD-10-CM | POA: Diagnosis not present

## 2014-12-15 DIAGNOSIS — Z5111 Encounter for antineoplastic chemotherapy: Secondary | ICD-10-CM | POA: Diagnosis not present

## 2014-12-15 DIAGNOSIS — C50212 Malignant neoplasm of upper-inner quadrant of left female breast: Secondary | ICD-10-CM | POA: Diagnosis not present

## 2014-12-15 DIAGNOSIS — Z9011 Acquired absence of right breast and nipple: Secondary | ICD-10-CM | POA: Diagnosis not present

## 2014-12-15 DIAGNOSIS — K1231 Oral mucositis (ulcerative) due to antineoplastic therapy: Secondary | ICD-10-CM | POA: Diagnosis not present

## 2014-12-15 DIAGNOSIS — C50912 Malignant neoplasm of unspecified site of left female breast: Secondary | ICD-10-CM | POA: Diagnosis not present

## 2014-12-15 DIAGNOSIS — C773 Secondary and unspecified malignant neoplasm of axilla and upper limb lymph nodes: Secondary | ICD-10-CM | POA: Diagnosis not present

## 2014-12-15 DIAGNOSIS — D649 Anemia, unspecified: Secondary | ICD-10-CM | POA: Diagnosis not present

## 2014-12-16 DIAGNOSIS — I10 Essential (primary) hypertension: Secondary | ICD-10-CM | POA: Diagnosis not present

## 2014-12-16 DIAGNOSIS — K1231 Oral mucositis (ulcerative) due to antineoplastic therapy: Secondary | ICD-10-CM | POA: Diagnosis not present

## 2014-12-16 DIAGNOSIS — Z87891 Personal history of nicotine dependence: Secondary | ICD-10-CM | POA: Diagnosis not present

## 2014-12-16 DIAGNOSIS — Z9221 Personal history of antineoplastic chemotherapy: Secondary | ICD-10-CM | POA: Diagnosis not present

## 2014-12-16 DIAGNOSIS — T451X5S Adverse effect of antineoplastic and immunosuppressive drugs, sequela: Secondary | ICD-10-CM | POA: Diagnosis not present

## 2014-12-16 DIAGNOSIS — D701 Agranulocytosis secondary to cancer chemotherapy: Secondary | ICD-10-CM | POA: Diagnosis not present

## 2014-12-16 DIAGNOSIS — M170 Bilateral primary osteoarthritis of knee: Secondary | ICD-10-CM | POA: Diagnosis not present

## 2014-12-16 DIAGNOSIS — Z853 Personal history of malignant neoplasm of breast: Secondary | ICD-10-CM | POA: Diagnosis not present

## 2014-12-16 DIAGNOSIS — D649 Anemia, unspecified: Secondary | ICD-10-CM | POA: Diagnosis not present

## 2014-12-16 DIAGNOSIS — Z79899 Other long term (current) drug therapy: Secondary | ICD-10-CM | POA: Diagnosis not present

## 2014-12-16 DIAGNOSIS — C773 Secondary and unspecified malignant neoplasm of axilla and upper limb lymph nodes: Secondary | ICD-10-CM | POA: Diagnosis not present

## 2014-12-16 DIAGNOSIS — Z9011 Acquired absence of right breast and nipple: Secondary | ICD-10-CM | POA: Diagnosis not present

## 2014-12-16 DIAGNOSIS — Z170 Estrogen receptor positive status [ER+]: Secondary | ICD-10-CM | POA: Diagnosis not present

## 2014-12-16 DIAGNOSIS — E785 Hyperlipidemia, unspecified: Secondary | ICD-10-CM | POA: Diagnosis not present

## 2014-12-16 DIAGNOSIS — C50212 Malignant neoplasm of upper-inner quadrant of left female breast: Secondary | ICD-10-CM | POA: Diagnosis not present

## 2014-12-16 DIAGNOSIS — Z5111 Encounter for antineoplastic chemotherapy: Secondary | ICD-10-CM | POA: Diagnosis not present

## 2014-12-17 DIAGNOSIS — F329 Major depressive disorder, single episode, unspecified: Secondary | ICD-10-CM | POA: Diagnosis not present

## 2014-12-17 DIAGNOSIS — G4700 Insomnia, unspecified: Secondary | ICD-10-CM | POA: Diagnosis not present

## 2014-12-17 DIAGNOSIS — I10 Essential (primary) hypertension: Secondary | ICD-10-CM | POA: Diagnosis not present

## 2014-12-23 DIAGNOSIS — Z79899 Other long term (current) drug therapy: Secondary | ICD-10-CM | POA: Diagnosis not present

## 2014-12-23 DIAGNOSIS — Z9221 Personal history of antineoplastic chemotherapy: Secondary | ICD-10-CM | POA: Diagnosis not present

## 2014-12-23 DIAGNOSIS — I10 Essential (primary) hypertension: Secondary | ICD-10-CM | POA: Diagnosis not present

## 2014-12-23 DIAGNOSIS — Z9011 Acquired absence of right breast and nipple: Secondary | ICD-10-CM | POA: Diagnosis not present

## 2014-12-23 DIAGNOSIS — C773 Secondary and unspecified malignant neoplasm of axilla and upper limb lymph nodes: Secondary | ICD-10-CM | POA: Diagnosis not present

## 2014-12-23 DIAGNOSIS — K1231 Oral mucositis (ulcerative) due to antineoplastic therapy: Secondary | ICD-10-CM | POA: Diagnosis not present

## 2014-12-23 DIAGNOSIS — C50212 Malignant neoplasm of upper-inner quadrant of left female breast: Secondary | ICD-10-CM | POA: Diagnosis not present

## 2014-12-23 DIAGNOSIS — D701 Agranulocytosis secondary to cancer chemotherapy: Secondary | ICD-10-CM | POA: Diagnosis not present

## 2014-12-23 DIAGNOSIS — M170 Bilateral primary osteoarthritis of knee: Secondary | ICD-10-CM | POA: Diagnosis not present

## 2014-12-23 DIAGNOSIS — E785 Hyperlipidemia, unspecified: Secondary | ICD-10-CM | POA: Diagnosis not present

## 2014-12-23 DIAGNOSIS — Z87891 Personal history of nicotine dependence: Secondary | ICD-10-CM | POA: Diagnosis not present

## 2014-12-23 DIAGNOSIS — Z5111 Encounter for antineoplastic chemotherapy: Secondary | ICD-10-CM | POA: Diagnosis not present

## 2014-12-23 DIAGNOSIS — Z853 Personal history of malignant neoplasm of breast: Secondary | ICD-10-CM | POA: Diagnosis not present

## 2014-12-23 DIAGNOSIS — T451X5S Adverse effect of antineoplastic and immunosuppressive drugs, sequela: Secondary | ICD-10-CM | POA: Diagnosis not present

## 2014-12-23 DIAGNOSIS — D649 Anemia, unspecified: Secondary | ICD-10-CM | POA: Diagnosis not present

## 2014-12-23 DIAGNOSIS — Z170 Estrogen receptor positive status [ER+]: Secondary | ICD-10-CM | POA: Diagnosis not present

## 2014-12-29 ENCOUNTER — Ambulatory Visit
Admit: 2014-12-29 | Disposition: A | Discharge status: Home / Self Care | Payer: Self-pay | Attending: Internal Medicine | Admitting: Internal Medicine

## 2014-12-30 DIAGNOSIS — Z7952 Long term (current) use of systemic steroids: Secondary | ICD-10-CM | POA: Diagnosis not present

## 2014-12-30 DIAGNOSIS — Z5111 Encounter for antineoplastic chemotherapy: Secondary | ICD-10-CM | POA: Diagnosis not present

## 2014-12-30 DIAGNOSIS — Z79899 Other long term (current) drug therapy: Secondary | ICD-10-CM | POA: Diagnosis not present

## 2014-12-30 DIAGNOSIS — Z9221 Personal history of antineoplastic chemotherapy: Secondary | ICD-10-CM | POA: Diagnosis not present

## 2014-12-30 DIAGNOSIS — Z170 Estrogen receptor positive status [ER+]: Secondary | ICD-10-CM | POA: Diagnosis not present

## 2014-12-30 DIAGNOSIS — I10 Essential (primary) hypertension: Secondary | ICD-10-CM | POA: Diagnosis not present

## 2014-12-30 DIAGNOSIS — Z853 Personal history of malignant neoplasm of breast: Secondary | ICD-10-CM | POA: Diagnosis not present

## 2014-12-30 DIAGNOSIS — C50212 Malignant neoplasm of upper-inner quadrant of left female breast: Secondary | ICD-10-CM | POA: Diagnosis not present

## 2014-12-30 DIAGNOSIS — M170 Bilateral primary osteoarthritis of knee: Secondary | ICD-10-CM | POA: Diagnosis not present

## 2014-12-30 DIAGNOSIS — D649 Anemia, unspecified: Secondary | ICD-10-CM | POA: Diagnosis not present

## 2014-12-30 DIAGNOSIS — R0609 Other forms of dyspnea: Secondary | ICD-10-CM | POA: Diagnosis not present

## 2014-12-30 DIAGNOSIS — Z9011 Acquired absence of right breast and nipple: Secondary | ICD-10-CM | POA: Diagnosis not present

## 2014-12-30 DIAGNOSIS — E785 Hyperlipidemia, unspecified: Secondary | ICD-10-CM | POA: Diagnosis not present

## 2014-12-30 DIAGNOSIS — C773 Secondary and unspecified malignant neoplasm of axilla and upper limb lymph nodes: Secondary | ICD-10-CM | POA: Diagnosis not present

## 2014-12-30 DIAGNOSIS — Z87891 Personal history of nicotine dependence: Secondary | ICD-10-CM | POA: Diagnosis not present

## 2015-01-06 DIAGNOSIS — E785 Hyperlipidemia, unspecified: Secondary | ICD-10-CM | POA: Diagnosis not present

## 2015-01-06 DIAGNOSIS — I10 Essential (primary) hypertension: Secondary | ICD-10-CM | POA: Diagnosis not present

## 2015-01-06 DIAGNOSIS — Z9011 Acquired absence of right breast and nipple: Secondary | ICD-10-CM | POA: Diagnosis not present

## 2015-01-06 DIAGNOSIS — Z7952 Long term (current) use of systemic steroids: Secondary | ICD-10-CM | POA: Diagnosis not present

## 2015-01-06 DIAGNOSIS — Z9221 Personal history of antineoplastic chemotherapy: Secondary | ICD-10-CM | POA: Diagnosis not present

## 2015-01-06 DIAGNOSIS — Z79899 Other long term (current) drug therapy: Secondary | ICD-10-CM | POA: Diagnosis not present

## 2015-01-06 DIAGNOSIS — C50212 Malignant neoplasm of upper-inner quadrant of left female breast: Secondary | ICD-10-CM | POA: Diagnosis not present

## 2015-01-06 DIAGNOSIS — C773 Secondary and unspecified malignant neoplasm of axilla and upper limb lymph nodes: Secondary | ICD-10-CM | POA: Diagnosis not present

## 2015-01-06 DIAGNOSIS — Z5111 Encounter for antineoplastic chemotherapy: Secondary | ICD-10-CM | POA: Diagnosis not present

## 2015-01-06 DIAGNOSIS — D649 Anemia, unspecified: Secondary | ICD-10-CM | POA: Diagnosis not present

## 2015-01-06 DIAGNOSIS — R0609 Other forms of dyspnea: Secondary | ICD-10-CM | POA: Diagnosis not present

## 2015-01-06 DIAGNOSIS — Z170 Estrogen receptor positive status [ER+]: Secondary | ICD-10-CM | POA: Diagnosis not present

## 2015-01-06 DIAGNOSIS — Z87891 Personal history of nicotine dependence: Secondary | ICD-10-CM | POA: Diagnosis not present

## 2015-01-06 DIAGNOSIS — M170 Bilateral primary osteoarthritis of knee: Secondary | ICD-10-CM | POA: Diagnosis not present

## 2015-01-06 DIAGNOSIS — Z853 Personal history of malignant neoplasm of breast: Secondary | ICD-10-CM | POA: Diagnosis not present

## 2015-01-13 DIAGNOSIS — C773 Secondary and unspecified malignant neoplasm of axilla and upper limb lymph nodes: Secondary | ICD-10-CM | POA: Diagnosis not present

## 2015-01-13 DIAGNOSIS — I10 Essential (primary) hypertension: Secondary | ICD-10-CM | POA: Diagnosis not present

## 2015-01-13 DIAGNOSIS — Z9221 Personal history of antineoplastic chemotherapy: Secondary | ICD-10-CM | POA: Diagnosis not present

## 2015-01-13 DIAGNOSIS — Z853 Personal history of malignant neoplasm of breast: Secondary | ICD-10-CM | POA: Diagnosis not present

## 2015-01-13 DIAGNOSIS — Z5111 Encounter for antineoplastic chemotherapy: Secondary | ICD-10-CM | POA: Diagnosis not present

## 2015-01-13 DIAGNOSIS — Z170 Estrogen receptor positive status [ER+]: Secondary | ICD-10-CM | POA: Diagnosis not present

## 2015-01-13 DIAGNOSIS — D649 Anemia, unspecified: Secondary | ICD-10-CM | POA: Diagnosis not present

## 2015-01-13 DIAGNOSIS — Z9011 Acquired absence of right breast and nipple: Secondary | ICD-10-CM | POA: Diagnosis not present

## 2015-01-13 DIAGNOSIS — Z7952 Long term (current) use of systemic steroids: Secondary | ICD-10-CM | POA: Diagnosis not present

## 2015-01-13 DIAGNOSIS — C50212 Malignant neoplasm of upper-inner quadrant of left female breast: Secondary | ICD-10-CM | POA: Diagnosis not present

## 2015-01-13 DIAGNOSIS — Z87891 Personal history of nicotine dependence: Secondary | ICD-10-CM | POA: Diagnosis not present

## 2015-01-13 DIAGNOSIS — E785 Hyperlipidemia, unspecified: Secondary | ICD-10-CM | POA: Diagnosis not present

## 2015-01-13 DIAGNOSIS — Z79899 Other long term (current) drug therapy: Secondary | ICD-10-CM | POA: Diagnosis not present

## 2015-01-13 DIAGNOSIS — R0609 Other forms of dyspnea: Secondary | ICD-10-CM | POA: Diagnosis not present

## 2015-01-13 DIAGNOSIS — M170 Bilateral primary osteoarthritis of knee: Secondary | ICD-10-CM | POA: Diagnosis not present

## 2015-01-18 DIAGNOSIS — R0602 Shortness of breath: Secondary | ICD-10-CM | POA: Diagnosis not present

## 2015-01-19 DIAGNOSIS — E785 Hyperlipidemia, unspecified: Secondary | ICD-10-CM | POA: Diagnosis not present

## 2015-01-19 DIAGNOSIS — Z7952 Long term (current) use of systemic steroids: Secondary | ICD-10-CM | POA: Diagnosis not present

## 2015-01-19 DIAGNOSIS — Z170 Estrogen receptor positive status [ER+]: Secondary | ICD-10-CM | POA: Diagnosis not present

## 2015-01-19 DIAGNOSIS — C773 Secondary and unspecified malignant neoplasm of axilla and upper limb lymph nodes: Secondary | ICD-10-CM | POA: Diagnosis not present

## 2015-01-19 DIAGNOSIS — Z5111 Encounter for antineoplastic chemotherapy: Secondary | ICD-10-CM | POA: Diagnosis not present

## 2015-01-19 DIAGNOSIS — Z9011 Acquired absence of right breast and nipple: Secondary | ICD-10-CM | POA: Diagnosis not present

## 2015-01-19 DIAGNOSIS — R0609 Other forms of dyspnea: Secondary | ICD-10-CM | POA: Diagnosis not present

## 2015-01-19 DIAGNOSIS — I10 Essential (primary) hypertension: Secondary | ICD-10-CM | POA: Diagnosis not present

## 2015-01-19 DIAGNOSIS — D649 Anemia, unspecified: Secondary | ICD-10-CM | POA: Diagnosis not present

## 2015-01-19 DIAGNOSIS — Z9221 Personal history of antineoplastic chemotherapy: Secondary | ICD-10-CM | POA: Diagnosis not present

## 2015-01-19 DIAGNOSIS — M170 Bilateral primary osteoarthritis of knee: Secondary | ICD-10-CM | POA: Diagnosis not present

## 2015-01-19 DIAGNOSIS — Z853 Personal history of malignant neoplasm of breast: Secondary | ICD-10-CM | POA: Diagnosis not present

## 2015-01-19 DIAGNOSIS — Z79899 Other long term (current) drug therapy: Secondary | ICD-10-CM | POA: Diagnosis not present

## 2015-01-19 DIAGNOSIS — C50212 Malignant neoplasm of upper-inner quadrant of left female breast: Secondary | ICD-10-CM | POA: Diagnosis not present

## 2015-01-19 DIAGNOSIS — Z87891 Personal history of nicotine dependence: Secondary | ICD-10-CM | POA: Diagnosis not present

## 2015-01-20 ENCOUNTER — Institutional Professional Consult (permissible substitution): Payer: Self-pay | Admitting: Internal Medicine

## 2015-01-20 DIAGNOSIS — Z170 Estrogen receptor positive status [ER+]: Secondary | ICD-10-CM | POA: Diagnosis not present

## 2015-01-20 DIAGNOSIS — Z9221 Personal history of antineoplastic chemotherapy: Secondary | ICD-10-CM | POA: Diagnosis not present

## 2015-01-20 DIAGNOSIS — Z5111 Encounter for antineoplastic chemotherapy: Secondary | ICD-10-CM | POA: Diagnosis not present

## 2015-01-20 DIAGNOSIS — I10 Essential (primary) hypertension: Secondary | ICD-10-CM | POA: Diagnosis not present

## 2015-01-20 DIAGNOSIS — D649 Anemia, unspecified: Secondary | ICD-10-CM | POA: Diagnosis not present

## 2015-01-20 DIAGNOSIS — C773 Secondary and unspecified malignant neoplasm of axilla and upper limb lymph nodes: Secondary | ICD-10-CM | POA: Diagnosis not present

## 2015-01-20 DIAGNOSIS — Z7952 Long term (current) use of systemic steroids: Secondary | ICD-10-CM | POA: Diagnosis not present

## 2015-01-20 DIAGNOSIS — M170 Bilateral primary osteoarthritis of knee: Secondary | ICD-10-CM | POA: Diagnosis not present

## 2015-01-20 DIAGNOSIS — Z87891 Personal history of nicotine dependence: Secondary | ICD-10-CM | POA: Diagnosis not present

## 2015-01-20 DIAGNOSIS — Z79899 Other long term (current) drug therapy: Secondary | ICD-10-CM | POA: Diagnosis not present

## 2015-01-20 DIAGNOSIS — C50212 Malignant neoplasm of upper-inner quadrant of left female breast: Secondary | ICD-10-CM | POA: Diagnosis not present

## 2015-01-20 DIAGNOSIS — E785 Hyperlipidemia, unspecified: Secondary | ICD-10-CM | POA: Diagnosis not present

## 2015-01-20 DIAGNOSIS — Z853 Personal history of malignant neoplasm of breast: Secondary | ICD-10-CM | POA: Diagnosis not present

## 2015-01-20 DIAGNOSIS — Z9011 Acquired absence of right breast and nipple: Secondary | ICD-10-CM | POA: Diagnosis not present

## 2015-01-20 DIAGNOSIS — R0609 Other forms of dyspnea: Secondary | ICD-10-CM | POA: Diagnosis not present

## 2015-01-21 ENCOUNTER — Ambulatory Visit (INDEPENDENT_AMBULATORY_CARE_PROVIDER_SITE_OTHER): Payer: Medicare Other | Admitting: Internal Medicine

## 2015-01-21 ENCOUNTER — Encounter: Payer: Self-pay | Admitting: Internal Medicine

## 2015-01-21 ENCOUNTER — Encounter (INDEPENDENT_AMBULATORY_CARE_PROVIDER_SITE_OTHER): Payer: Self-pay

## 2015-01-21 VITALS — BP 124/80 | HR 97

## 2015-01-21 DIAGNOSIS — R0600 Dyspnea, unspecified: Secondary | ICD-10-CM | POA: Insufficient documentation

## 2015-01-21 DIAGNOSIS — R0609 Other forms of dyspnea: Secondary | ICD-10-CM | POA: Diagnosis not present

## 2015-01-21 NOTE — Patient Instructions (Signed)
Follow up with Dr. Stevenson Clinch in 85month - pulmonary function testing and 6 minute walk test prior to follow up - HRCT without contrast prior to follow up  - exercise as tolerated  - healthy diet and weight loss

## 2015-01-21 NOTE — Assessment & Plan Note (Addendum)
Multifactorial - low EF, possible chemo related, deconditioning, obesity, obstructive versus restrictive disease, anemia, LV dysfunction  I believe that her shortness of breath with exertion is multifactorial in nature as stated above. I reviewed her CBCs over the last 4-5 months and noted that there is a drop in her hemoglobin from 12 to about 9, also her ejection fraction from her MUGA scan in November 2015 compared to her echo in 2016 has reduced from 60% to about 45%, which could be adding to her dyspnea. Patient will also be following with Cardiology.    Plan: - pulmonary function testing and 6 minute walk test prior to follow up - albuterol inhaler - 2puff every 3-4 hours as needed for shortness of breath\wheezing\recurrent cough - continue Advair Diskus - 1 puff twice a day (rinse and gargle after each use). - HRCT to evaluate for any ILD from chemo.  - continue with Cardiology follow up.

## 2015-01-21 NOTE — Progress Notes (Signed)
Date: 01/21/2015  MRN# [id number] Heidi [name] Oct 18, 1941  Referring Physician:  Dr. Ma Hillock [facility])  Heidi [name] is a 74 y.o. old female seen in consultation for dyspnea on exertion  CC:  Chief Complaint  Patient presents with  . Advice Only    Pt referred by Dr. Ma Hillock for sob. Pt says it has been ongoing for 3 years but recently worsened.    HPI:  Patient is a pleasant 74 year old female past medical history of left breast cancer, status post chemotherapy, hypertension, hyperlipidemia, insomnia, right total mastectomy in 1990, presenting for dyspnea on exertion. Patient states that his been exertion started about 3 years ago,however in the last 5-6 months it has been worst. No inciting factors 3 years ago. DOE coming from parking lot to waiting area. She denies cough, fever or night sweats in the last 2 months.  Has a mild runny nose. No history of seasonal allergies. No recent sick contact Current chemo regiment is Taxol. Previously received adriamycin and cytoxan as part of neoadjuvant chemotherapy for left breast cancer.  In 2014 she saw her PMD for DOE, was given an "inhaler" which did not provide much relief.  Travelled to [place] in 07/2013, was on cruise, was able to perform daily walking with little discomfort at that time.   Review of Chart Dr. Ma Hillock last note 12/23/14  Chief Complaint/Diagnosis:   Left breast Invasive Mammary Carcinoma with biopsy-proven left axillary lymph node metastasis. Ultrasound/mammogram on 07/27/14 and reported 10 x 10 x 9 mm mass in the left upper inner breast 9:00 position, multiple thickened left axillary lymph nodes largest measuring 8 x 6 mm. Ultrasound-guided core biopsy of the left breast mass with clip placement, and left axillary lymph node biopsy on 08/17/14 reported as consistent with invasive mammary carcinoma in both biopsies, ER positive (>90%), PR positive (>90%), HER-2/neu negative (was 2+ on IHC, FISH negative with Her2/CEP17  ratio of 1.18).  Clinically stage T1N1Mx (at least stage IIA)  -  patient on neoadjuvant chemotherapy. Completed AC x 4 (09/28/14 - 12/02/14). Starting weekly Taxol today (12/23/14) HPI:   Patient returns for scheduled oncology appointment and to start weekly Taxol chemotherapy. Overall states that she did well with [facility] treatment last week. She had a few sores in the mouth which caused some discomfort during brushing her teeth but otherwise this has improved since then. Currently no soreness in the mouth, throat pain, or difficulty swalllowing. Continues to eat well. Remains physically active. No new dyspnea, orthopnea, or PND. No new leg swelling. Feels that breast mass has significantly shrunk with chemotherapy. No other pain issues, 0/10. No new mood disturbances. No nausea, vomiting, or diarrhea.  Additional Past Medical and Surgical History: Past Medical History/past Surgical History -   Hypertension  Depression  Hyperlipidemia  Osteoarthritis in the knees, wrist  Insomnia  Cataracts  History of UTI  Right total mastectomy and ALND in 1990 for history of right breast cancer, per patient report she had 9 of 16 lymph nodes positive for metastasis, medullary histology, states she received adjuvant chemotherapy with Cytoxan/Methotrexate/5-Fluorouracil 6 cycles. This was in [place].    Family History - remarkable for diabetes, bladder cancer, skin cancer. Mother with history of dementia. Denies breast, colon or ovarian malignancies.    Social History - ex-smoker, quit 23 years ago. Denies alcohol usage. Tries to be physically active.  Impression:   1. Left breast Invasive Mammary Carcinoma with biopsy-proven left axillary lymph node metastasis. Ultrasound/mammogram on 07/27/14 and reported 10  x 10 x 9 mm mass in the left upper inner breast 9:00 position, multiple thickened left axillary lymph nodes largest measuring 8 x 6 mm. Ultrasound-guided core biopsy of the left breast mass with clip  placement, and left axillary lymph node biopsy on 08/17/14 reported as consistent with invasive mammary carcinoma in both biopsies, ER positive (>90%), PR positive (>90%), HER-2/neu negative (was 2+ on IHC, FISH negative with Her2/CEP17 ratio of 1.18).  Clinically stage T1N1Mx (at least stage IIA)   -   Repeat mammogram/ultrasound of the left breast on February 16 was independently reviewed and discussed with patient, it indicates positive response to neoadjuvant chemotherapy with decrease in size of the mass which is now 7.5 x 6.6 x 5.6 mm, no enlarged axillary lymph nodes seen and also have decreased in size from the prior study. Given good response to neoadjuvant chemotherapy, plan is to complete remaining chemotherapy with weekly Taxol x12 doses, patient explained about rationale, benefits, and possible side effects of Taxol. She is agreeable to this and expressed verbal consent. Blood counts are adequate, we will start week 1 Taxol 80 mg/m2 IV with premedication. Get CBC and differential at 1 week and 2 weeks and pursue next 2 doses of Taxol if labs are adequate. Next MD followup at 3 weeks to plan continued chemotherapy with repeat labs.  2. Anemia likely secondary to chemotherapy - mild, no new symptoms. Continue to monitor.  3. Mucositis, thrush - secondary to chemotherapy, now resolved.  4. Recent neutropenia - secondary to last dose of chemotherapy. Currently has improved. Continue to monitor.  PMHX:   Past Medical History  Diagnosis Date  . Hypertension   . Depression   . Hyperlipidemia   . Osteoarthritis   . Insomnia   . Cataracts, bilateral   . UTI (lower urinary tract infection)   . Breast cancer     1990   Surgical Hx:  Past Surgical History  Procedure Laterality Date  . Mastectomy Right     lower 1990   Family Hx:  Family History  Problem Relation Age of Onset  . Dementia Mother   . Bladder Cancer Father    Social Hx:   History  Substance Use Topics  . Smoking status:  Former Smoker -- 2.00 packs/day for 25 years    Types: Cigarettes  . Smokeless tobacco: Never Used     Comment: Pt quit 1995  . Alcohol Use: No   Medication:   Current Outpatient Rx  Name  Route  Sig  Dispense  Refill  . diphenoxylate-atropine (LOMOTIL) 2.5-0.025 MG per tablet   Oral   Take 1 tablet by mouth as needed.         Marland Kitchen losartan (COZAAR) 50 MG tablet   Oral   Take 150 mg by mouth daily.         . naproxen (NAPROSYN) 250 MG tablet   Oral   Take 250 mg by mouth as needed.         Marland Kitchen oxyCODONE (OXY IR/ROXICODONE) 5 MG immediate release tablet   Oral   Take 5 mg by mouth every 4 (four) hours as needed.         . sertraline (ZOLOFT) 100 MG tablet   Oral   Take 100 mg by mouth daily.         Marland Kitchen zolpidem (AMBIEN) 10 MG tablet   Oral   Take 10 mg by mouth at bedtime.  Allergies:  Hydrochloric acid  Review of Systems: Gen:  Denies  fever, sweats, chills HEENT: Denies blurred vision, double vision, ear pain, eye pain, hearing loss, nose bleeds, sore throat Cvc:  No dizziness, chest pain or heaviness Resp:   Shortness of breath, dyspnea on exertion, runny nose, fatigue Gi: Denies swallowing difficulty, stomach pain, nausea or vomiting, diarrhea, constipation, bowel incontinence Gu:  Denies bladder incontinence, burning urine Ext:   No Joint pain, stiffness or swelling Skin: No skin rash, easy bruising or bleeding or hives Endoc:  No polyuria, polydipsia , polyphagia or weight change Psych: No depression, insomnia or hallucinations  Other:  All other systems negative  Physical Examination:   VS: BP 124/80 mmHg  Pulse 97  SpO2 98%  General Appearance: No distress  Neuro:without focal findings, mental status, speech normal, alert and oriented, cranial nerves 2-12 intact, reflexes normal and symmetric, sensation grossly normal  HEENT: PERRLA, EOM intact, no ptosis, no other lesions noticed; Mallampati 3 Pulmonary: normal breath sounds.,  diaphragmatic excursion normal.No wheezing, No rales;   Sputum Production:  none CardiovascularNormal S1,S2.  No m/r/g.  Abdominal aorta pulsation normal.    Abdomen: Benign, Soft, non-tender, No masses, hepatosplenomegaly, No lymphadenopathy Renal:  No costovertebral tenderness  GU:  No performed at this time. Endoc: No evident thyromegaly, no signs of acromegaly or Cushing features Skin:   warm, no rashes, no ecchymosis  Extremities: normal, no cyanosis, clubbing, no edema, warm with normal capillary refill. Other findings:none  Labs results:   Rad results: (The following images and results were reviewed by Dr. Stevenson Clinch). CXR 09/21/14 COMPARISON: 09/21/2014  FINDINGS: Cardiomediastinal silhouette is unremarkable. No acute infiltrate or pulmonary edema. Hyperinflation and probable chronic mild interstitial prominence. There is right IJ Port-A-Cath with tip in distal SVC. No pneumothorax.  IMPRESSION: Right IJ Port-A-Cath with tip in SVC. No pneumothorax.  Mild hyperinflation. Probable chronic interstitial prominence.      ECHO 01/19/15:    Summary:  1. Left ventricular ejection fraction, by visual estimation, is 40 to  45%.  2. Mildly to moderately decreased global left ventricular systolic  function.  3. Mild mitral valve regurgitation.  4. Mildly increased left ventricular internal cavity size.  5. Moderately increased left ventricular posterior wall thickness.  6. Mild tricuspid regurgitation.  Physician interpretation Left Ventricle: The left ventricular internal cavity size was mildly  increased. LV posterior wall thickness was moderately increased. Global   LV systolic function was mildly to moderately decreased. Left ventricular  ejection fraction, by visual estimation, is 40 to 45%. Right Ventricle: Normal right ventricular size, wall thickness, and  systolic function. RV wall thickness is normal. Left Atrium: The left atrium is normal in size and  structure. Right Atrium: The right atrium is normal in size and structure. Pericardium: There is no evidence of pericardial effusion. Mitral Valve: Structurally normal mitral valve, with normal leaflet  excursion; without any evidence of mitral stenosis or significant  regurgitation. Mild mitral valve regurgitation is seen. Tricuspid Valve: Mild tricuspid regurgitation is visualized. The  tricuspid regurgitant velocity is 2.20 m/s, and with an assumed right  atrial pressure of 5 mmHg, the estimated right ventricular systolic  pressure is normal at 24.3 mmHg. Aortic Valve: The aortic valve is trileaflet and structurally normal,   with normal leaflet excursion; without any evidence of aortic stenosis or  insufficiency. Pulmonic Valve: Structurally normal pulmonic valve, with normal leaflet  excursion. Aorta: The aortic root and ascending aorta are structurally normal, with  no evidence of dilitation.  Shunts: There is no evidence of a patent foramen ovale. There is no  evidence of an atrial septal defect.  MUGA 09/18/2014 EXAM: NUCLEAR MEDICINE CARDIAC BLOOD POOL IMAGING (MUGA)  TECHNIQUE: Cardiac multi-gated acquisition was performed at rest following intravenous injection of Tc-43mlabeled red blood cells. RADIOPHARMACEUTICALS:  21.2 MCiTc-973mn-vitro labeled red blood cells.  COMPARISON:  None  FINDINGS: The calculated left ventricular ejection fraction equals 60.5%. Normal left ventricular wall motion and thickening.   IMPRESSION: 1. The left ventricular ejection fraction measures 60.5%.  Assessment and Plan: [age]ear old female, former smoker, history of breast cancer, hypertension, hyperlipidemia, recent administration of Adriamycin and Taxol for her breast cancer on the left, seen in consultation for worsening dyspnea on exertion. DOE (dyspnea on exertion) Multifactorial - low EF, possible chemo related, deconditioning, obesity, obstructive versus restrictive disease,  anemia, LV dysfunction  I believe that her shortness of breath with exertion is multifactorial in nature as stated above. I reviewed her CBCs over the last 4-5 months and noted that there is a drop in her hemoglobin from 12 to about 9, also her ejection fraction from her MUGA scan in November 2015 compared to her echo in 2016 has reduced from 60% to about 45%, which could be adding to her dyspnea. Patient will also be following with Cardiology.    Plan: - pulmonary function testing and 6 minute walk test prior to follow up - albuterol inhaler - 2puff every 3-4 hours as needed for shortness of breath\wheezing\recurrent cough - continue Advair Diskus - 1 puff twice a day (rinse and gargle after each use). - HRCT to evaluate for any ILD from chemo.  - continue with Cardiology follow up.      Updated Medication List Outpatient Encounter Prescriptions as of 01/21/2015  Medication Sig  . diphenoxylate-atropine (LOMOTIL) 2.5-0.025 MG per tablet Take 1 tablet by mouth as needed.  . Marland Kitchenosartan (COZAAR) 50 MG tablet Take 150 mg by mouth daily.  . naproxen (NAPROSYN) 250 MG tablet Take 250 mg by mouth as needed.  . Marland KitchenxyCODONE (OXY IR/ROXICODONE) 5 MG immediate release tablet Take 5 mg by mouth every 4 (four) hours as needed.  . sertraline (ZOLOFT) 100 MG tablet Take 100 mg by mouth daily.  . Marland Kitchenolpidem (AMBIEN) 10 MG tablet Take 10 mg by mouth at bedtime.    Orders for this visit: Orders Placed This Encounter  Procedures  . CT Chest High Resolution    Standing Status: Future     Number of Occurrences:      Standing Expiration Date: 03/22/2016    Scheduling Instructions:     Dr. EnWeber Cooksr BlRosario Jackso read     [facility]    Order Specific Question:  Reason for Exam (SYMPTOM  OR DIAGNOSIS REQUIRED)    Answer:  sob    Order Specific Question:  Preferred imaging location?    Answer:  [facility]  . Pulmonary function test    Standing Status: Future     Number of Occurrences:       Standing Expiration Date: 01/21/2016    Order Specific Question:  Where should this test be performed?    Answer:  [facility]    Order Specific Question:  Full PFT: includes the following: basic spirometry, spirometry pre & post bronchodilator, diffusion capacity (DLCO), lung volumes    Answer:  Full PFT    Order Specific Question:  MIP/MEP    Answer:  No    Order Specific Question:  6 minute walk  Answer:  Yes    Order Specific Question:  ABG    Answer:  No    Order Specific Question:  Diffusion capacity (DLCO)    Answer:  No    Order Specific Question:  Lung volumes    Answer:  No    Order Specific Question:  Methacholine challenge    Answer:  No     Thank  you for the consultation and for allowing [facility] to assist in the care of your patient. Our recommendations are noted above.  Please contact us if we can be of further service.   Vilinda Boehringer, MD [facility] Office Number: [phone]

## 2015-01-22 LAB — CBC CANCER CENTER
BASOS ABS: 0 x10 3/mm (ref 0.0–0.1)
Basophil %: 1 %
Eosinophil #: 0.1 x10 3/mm (ref 0.0–0.7)
Eosinophil %: 3.7 %
HCT: 28.5 % — ABNORMAL LOW (ref 35.0–47.0)
HGB: 9.7 g/dL — ABNORMAL LOW (ref 12.0–16.0)
Lymphocyte #: 1.1 x10 3/mm (ref 1.0–3.6)
Lymphocyte %: 27.8 %
MCH: 35.1 pg — ABNORMAL HIGH (ref 26.0–34.0)
MCHC: 33.9 g/dL (ref 32.0–36.0)
MCV: 103 fL — ABNORMAL HIGH (ref 80–100)
Monocyte #: 0.3 x10 3/mm (ref 0.2–0.9)
Monocyte %: 8.3 %
NEUTROS ABS: 2.3 x10 3/mm (ref 1.4–6.5)
Neutrophil %: 59.2 %
Platelet: 261 x10 3/mm (ref 150–440)
RBC: 2.75 10*6/uL — AB (ref 3.80–5.20)
RDW: 13.6 % (ref 11.5–14.5)
WBC: 3.8 x10 3/mm (ref 3.6–11.0)

## 2015-01-27 DIAGNOSIS — D649 Anemia, unspecified: Secondary | ICD-10-CM | POA: Diagnosis not present

## 2015-01-27 DIAGNOSIS — Z170 Estrogen receptor positive status [ER+]: Secondary | ICD-10-CM | POA: Diagnosis not present

## 2015-01-27 DIAGNOSIS — Z9011 Acquired absence of right breast and nipple: Secondary | ICD-10-CM | POA: Diagnosis not present

## 2015-01-27 DIAGNOSIS — Z5111 Encounter for antineoplastic chemotherapy: Secondary | ICD-10-CM | POA: Diagnosis not present

## 2015-01-27 DIAGNOSIS — I10 Essential (primary) hypertension: Secondary | ICD-10-CM | POA: Diagnosis not present

## 2015-01-27 DIAGNOSIS — E785 Hyperlipidemia, unspecified: Secondary | ICD-10-CM | POA: Diagnosis not present

## 2015-01-27 DIAGNOSIS — R0609 Other forms of dyspnea: Secondary | ICD-10-CM | POA: Diagnosis not present

## 2015-01-27 DIAGNOSIS — Z7952 Long term (current) use of systemic steroids: Secondary | ICD-10-CM | POA: Diagnosis not present

## 2015-01-27 DIAGNOSIS — Z853 Personal history of malignant neoplasm of breast: Secondary | ICD-10-CM | POA: Diagnosis not present

## 2015-01-27 DIAGNOSIS — M170 Bilateral primary osteoarthritis of knee: Secondary | ICD-10-CM | POA: Diagnosis not present

## 2015-01-27 DIAGNOSIS — C50212 Malignant neoplasm of upper-inner quadrant of left female breast: Secondary | ICD-10-CM | POA: Diagnosis not present

## 2015-01-27 DIAGNOSIS — Z9221 Personal history of antineoplastic chemotherapy: Secondary | ICD-10-CM | POA: Diagnosis not present

## 2015-01-27 DIAGNOSIS — Z79899 Other long term (current) drug therapy: Secondary | ICD-10-CM | POA: Diagnosis not present

## 2015-01-27 DIAGNOSIS — Z87891 Personal history of nicotine dependence: Secondary | ICD-10-CM | POA: Diagnosis not present

## 2015-01-27 DIAGNOSIS — C773 Secondary and unspecified malignant neoplasm of axilla and upper limb lymph nodes: Secondary | ICD-10-CM | POA: Diagnosis not present

## 2015-01-27 LAB — CBC CANCER CENTER
BASOS ABS: 0 x10 3/mm (ref 0.0–0.1)
Basophil %: 1.3 %
EOS PCT: 3.1 %
Eosinophil #: 0.1 x10 3/mm (ref 0.0–0.7)
HCT: 29.2 % — ABNORMAL LOW (ref 35.0–47.0)
HGB: 9.9 g/dL — ABNORMAL LOW (ref 12.0–16.0)
Lymphocyte #: 0.9 x10 3/mm — ABNORMAL LOW (ref 1.0–3.6)
Lymphocyte %: 24.3 %
MCH: 35.1 pg — ABNORMAL HIGH (ref 26.0–34.0)
MCHC: 34 g/dL (ref 32.0–36.0)
MCV: 103 fL — ABNORMAL HIGH (ref 80–100)
MONO ABS: 0.3 x10 3/mm (ref 0.2–0.9)
MONOS PCT: 6.9 %
NEUTROS PCT: 64.4 %
Neutrophil #: 2.4 x10 3/mm (ref 1.4–6.5)
PLATELETS: 244 x10 3/mm (ref 150–440)
RBC: 2.83 10*6/uL — ABNORMAL LOW (ref 3.80–5.20)
RDW: 13.9 % (ref 11.5–14.5)
WBC: 3.7 x10 3/mm (ref 3.6–11.0)

## 2015-01-29 ENCOUNTER — Ambulatory Visit
Admit: 2015-01-29 | Disposition: A | Discharge status: Home / Self Care | Payer: Self-pay | Attending: Internal Medicine | Admitting: Internal Medicine

## 2015-02-02 LAB — CREATININE, SERUM: Creatine, Serum: 0.43

## 2015-02-03 DIAGNOSIS — M79604 Pain in right leg: Secondary | ICD-10-CM | POA: Diagnosis not present

## 2015-02-03 DIAGNOSIS — M79605 Pain in left leg: Secondary | ICD-10-CM | POA: Diagnosis not present

## 2015-02-03 DIAGNOSIS — R2242 Localized swelling, mass and lump, left lower limb: Secondary | ICD-10-CM | POA: Diagnosis not present

## 2015-02-03 DIAGNOSIS — R2241 Localized swelling, mass and lump, right lower limb: Secondary | ICD-10-CM | POA: Diagnosis not present

## 2015-02-03 LAB — CBC CANCER CENTER
BASOS PCT: 0.6 %
Basophil #: 0 x10 3/mm (ref 0.0–0.1)
EOS PCT: 1.6 %
Eosinophil #: 0.1 x10 3/mm (ref 0.0–0.7)
HCT: 29.2 % — AB (ref 35.0–47.0)
HGB: 9.9 g/dL — AB (ref 12.0–16.0)
LYMPHS PCT: 19.8 %
Lymphocyte #: 1 x10 3/mm (ref 1.0–3.6)
MCH: 34.8 pg — ABNORMAL HIGH (ref 26.0–34.0)
MCHC: 33.9 g/dL (ref 32.0–36.0)
MCV: 103 fL — ABNORMAL HIGH (ref 80–100)
Monocyte #: 0.4 x10 3/mm (ref 0.2–0.9)
Monocyte %: 8 %
NEUTROS ABS: 3.7 x10 3/mm (ref 1.4–6.5)
Neutrophil %: 70 %
Platelet: 273 x10 3/mm (ref 150–440)
RBC: 2.85 10*6/uL — AB (ref 3.80–5.20)
RDW: 14.3 % (ref 11.5–14.5)
WBC: 5.2 x10 3/mm (ref 3.6–11.0)

## 2015-02-03 LAB — HEPATIC FUNCTION PANEL A (ARMC)
ALBUMIN: 3.8 g/dL
AST: 22 U/L
Alkaline Phosphatase: 55 U/L
BILIRUBIN DIRECT: 0.1 mg/dL
BILIRUBIN INDIRECT: 0.5
BILIRUBIN TOTAL: 0.6 mg/dL
SGPT (ALT): 21 U/L
Total Protein: 6.2 g/dL — ABNORMAL LOW

## 2015-02-03 LAB — CREATININE, SERUM
Creatinine: 0.65 mg/dL
EGFR (Non-African Amer.): >60

## 2015-02-08 ENCOUNTER — Telehealth: Payer: Self-pay | Admitting: *Deleted

## 2015-02-08 LAB — CBC CANCER CENTER
BASOS ABS: 0 x10 3/mm (ref 0.0–0.1)
Basophil %: 0.7 %
EOS ABS: 0.1 x10 3/mm (ref 0.0–0.7)
Eosinophil %: 1.6 %
HCT: 31.1 % — ABNORMAL LOW (ref 35.0–47.0)
HGB: 10.3 g/dL — ABNORMAL LOW (ref 12.0–16.0)
LYMPHS ABS: 1 x10 3/mm (ref 1.0–3.6)
Lymphocyte %: 21.8 %
MCH: 34.2 pg — AB (ref 26.0–34.0)
MCHC: 33.2 g/dL (ref 32.0–36.0)
MCV: 103 fL — AB (ref 80–100)
Monocyte #: 0.7 x10 3/mm (ref 0.2–0.9)
Monocyte %: 15.5 %
Neutrophil #: 2.7 x10 3/mm (ref 1.4–6.5)
Neutrophil %: 60.4 %
PLATELETS: 268 x10 3/mm (ref 150–440)
RBC: 3.02 10*6/uL — ABNORMAL LOW (ref 3.80–5.20)
RDW: 14.7 % — AB (ref 11.5–14.5)
WBC: 4.5 x10 3/mm (ref 3.6–11.0)

## 2015-02-08 LAB — BASIC METABOLIC PANEL
ANION GAP: 8 (ref 7–16)
BUN: 10 mg/dL
CHLORIDE: 107 mmol/L
CO2: 26 mmol/L
CREATININE: 0.74 mg/dL
Calcium, Total: 8.6 mg/dL — ABNORMAL LOW
EGFR (Non-African Amer.): >60
Glucose: 155 mg/dL — ABNORMAL HIGH
Potassium: 3.6 mmol/L
Sodium: 141 mmol/L

## 2015-02-08 NOTE — Telephone Encounter (Signed)
Called patient and left msg to see if she can come in on Mon. April 25th at 3 pm for SMW 3:30 PFT Then come back on April 26 @ 2:45 or April 27th @ 2:45 or 3:45 for 1 month follow up. Dr. Stevenson Clinch will be out of the office after that until May16th. We will contact Dr. Beverly Gust office after appointment is made.

## 2015-02-09 NOTE — Telephone Encounter (Signed)
Patient scheduled to have PFT done 02/18/15 at 11am, 6MW at 12 and will see Dr. Stevenson Clinch on 4/26 at 2:45pm.    FYI to [facility]

## 2015-02-09 NOTE — Telephone Encounter (Signed)
[phone], pt cb requesting noon appt

## 2015-02-10 ENCOUNTER — Telehealth: Payer: Self-pay | Admitting: *Deleted

## 2015-02-10 NOTE — Telephone Encounter (Signed)
[place] spoke with Dixie the operator who transferred me to the triage nurse. I was then sent to voicemail in which I left a message re: Ms. Freundlich and dob has been scheduled for PFT,SMW. I also left my contact information for a return call. Dr. Stevenson Clinch asked that once this was scheduled to contact them with patient's appointment information.

## 2015-02-11 ENCOUNTER — Ambulatory Visit
Admit: 2015-02-11 | Disposition: A | Discharge status: Home / Self Care | Payer: Self-pay | Attending: Internal Medicine | Admitting: Internal Medicine

## 2015-02-11 DIAGNOSIS — R0602 Shortness of breath: Secondary | ICD-10-CM | POA: Diagnosis not present

## 2015-02-11 DIAGNOSIS — Z853 Personal history of malignant neoplasm of breast: Secondary | ICD-10-CM | POA: Diagnosis not present

## 2015-02-15 LAB — CBC CANCER CENTER
Basophil #: 0.1 x10 3/mm (ref 0.0–0.1)
Basophil %: 0.7 %
EOS PCT: 1.4 %
Eosinophil #: 0.1 x10 3/mm (ref 0.0–0.7)
HCT: 29.1 % — AB (ref 35.0–47.0)
HGB: 9.9 g/dL — ABNORMAL LOW (ref 12.0–16.0)
LYMPHS ABS: 1.2 x10 3/mm (ref 1.0–3.6)
Lymphocyte %: 15 %
MCH: 34.3 pg — AB (ref 26.0–34.0)
MCHC: 34.1 g/dL (ref 32.0–36.0)
MCV: 101 fL — AB (ref 80–100)
MONO ABS: 0.4 x10 3/mm (ref 0.2–0.9)
MONOS PCT: 5.4 %
Neutrophil #: 6.4 x10 3/mm (ref 1.4–6.5)
Neutrophil %: 77.5 %
Platelet: 296 x10 3/mm (ref 150–440)
RBC: 2.89 10*6/uL — ABNORMAL LOW (ref 3.80–5.20)
RDW: 14.9 % — ABNORMAL HIGH (ref 11.5–14.5)
WBC: 8.3 x10 3/mm (ref 3.6–11.0)

## 2015-02-16 ENCOUNTER — Encounter: Payer: Self-pay | Admitting: Cardiovascular Disease

## 2015-02-16 ENCOUNTER — Ambulatory Visit (INDEPENDENT_AMBULATORY_CARE_PROVIDER_SITE_OTHER): Payer: Medicare Other | Admitting: Cardiovascular Disease

## 2015-02-16 VITALS — BP 150/68 | HR 96 | Ht 68.0 in | Wt 238.8 lb

## 2015-02-16 DIAGNOSIS — R0602 Shortness of breath: Secondary | ICD-10-CM

## 2015-02-16 DIAGNOSIS — C50919 Malignant neoplasm of unspecified site of unspecified female breast: Secondary | ICD-10-CM

## 2015-02-16 DIAGNOSIS — Z720 Tobacco use: Secondary | ICD-10-CM

## 2015-02-16 DIAGNOSIS — E669 Obesity, unspecified: Secondary | ICD-10-CM | POA: Insufficient documentation

## 2015-02-16 DIAGNOSIS — Z87891 Personal history of nicotine dependence: Secondary | ICD-10-CM

## 2015-02-16 DIAGNOSIS — R0609 Other forms of dyspnea: Secondary | ICD-10-CM

## 2015-02-16 DIAGNOSIS — Z9221 Personal history of antineoplastic chemotherapy: Secondary | ICD-10-CM

## 2015-02-16 DIAGNOSIS — I10 Essential (primary) hypertension: Secondary | ICD-10-CM | POA: Diagnosis not present

## 2015-02-16 MED ORDER — FUROSEMIDE 40 MG PO TABS: 40.0000 mg | ORAL_TABLET | Freq: Every day | ORAL | Status: DC | PRN

## 2015-02-16 MED ORDER — METOPROLOL SUCCINATE ER 50 MG PO TB24: 50.0000 mg | ORAL_TABLET | Freq: Every day | ORAL | Status: DC

## 2015-02-16 MED ORDER — POTASSIUM CHLORIDE CRYS ER 20 MEQ PO TBCR: 20.0000 meq | EXTENDED_RELEASE_TABLET | Freq: Every day | ORAL | Status: DC | PRN

## 2015-02-16 NOTE — Assessment & Plan Note (Signed)
She has completed Adriamycin. Currently on Taxol

## 2015-02-16 NOTE — Assessment & Plan Note (Signed)
Suspect deconditioning obesity playing a role in her symptoms After she is done with her testing, would recommend a regular walking program

## 2015-02-16 NOTE — Assessment & Plan Note (Signed)
Followed by oncology, Dr. Ma Hillock. Recent CT scan with no metastases

## 2015-02-16 NOTE — Patient Instructions (Addendum)
You are anemic, normal is >35, you are 32  We will schedule a stress test to make sure you do not have have blockages, lexiscan  Please start 1/2 metoprolol succinate for heart rate control OK to increase up to a full pill if needed for fast heart rate  Please take lasix as needed every other day/three times a week with potassium for shortness of breath  Please call us if you have new issues that need to be addressed before your next appt.  Your physician wants you to follow-up in: 4 week  [place] caregiver has ordered a Stress Test with nuclear imaging. The purpose of this test is to evaluate the blood supply to your heart muscle. This procedure is referred to as a "Non-Invasive Stress Test." This is because other than having an IV started in your vein, nothing is inserted or "invades" your body. Cardiac stress tests are done to find areas of poor blood flow to the heart by determining the extent of coronary artery disease (CAD). Some patients exercise on a treadmill, which naturally increases the blood flow to your heart, while others who are  unable to walk on a treadmill due to physical limitations have a pharmacologic/chemical stress agent called Lexiscan . This medicine will mimic walking on a treadmill by temporarily increasing your coronary blood flow.   Please note: these test may take anywhere between 2-4 hours to complete  PLEASE REPORT TO [facility] AT THE FIRST DESK WILL DIRECT YOU WHERE TO GO  Date of Procedure:_____________________________________  Arrival Time for Procedure:______________________________  Instructions regarding medication:   __X__:  Hold betablocker(s) night before procedure and morning of procedure: METOPROLOL   PLEASE NOTIFY THE OFFICE AT LEAST 24 HOURS IN ADVANCE IF YOU ARE UNABLE TO KEEP YOUR APPOINTMENT.  [phone] AND  PLEASE NOTIFY NUCLEAR MEDICINE AT [facility] AT LEAST 24 HOURS IN ADVANCE IF YOU ARE  UNABLE TO KEEP YOUR APPOINTMENT. [phone]  How to prepare for your Myoview test:  1. Do not eat or drink after midnight 2. No caffeine for 24 hours prior to test 3. No smoking 24 hours prior to test. 4. Your medication may be taken with water.  If your doctor stopped a medication because of this test, do not take that medication. 5. Ladies, please do not wear dresses.  Skirts or pants are appropriate. Please wear a short sleeve shirt. 6. No perfume, cologne or lotion.

## 2015-02-16 NOTE — Assessment & Plan Note (Signed)
30 year smoking history. Recently seen by pulmonary

## 2015-02-16 NOTE — Assessment & Plan Note (Signed)
I suspect her shortness of breath symptoms is multifactorial including deconditioning, obesity, possible diastolic CHF, 30 year history of smoking/mild emphysema, unable to exclude ischemia. We have ordered a stress test to rule out ischemia. She is unable to treadmill and pharmacologic study has been ordered. We have given her Lasix 40 mg to take with potassium 20 mEq sparingly for worsening symptoms. Clinically does not appear to be very volume overloaded. This was discussed with her. If stress test looks normal, would recommend a regular walking program. Unable to exclude tachycardia is contributing to her symptoms. Baseline heart rate in the high 90s. We will start low-dose beta blocker for rate control.  She is scheduled to have PFTs with pulmonary

## 2015-02-16 NOTE — Progress Notes (Signed)
Patient ID: Heidi [name], female    DOB: October 12, 1941, 74 y.o.   MRN: [id number]  HPI Comments: Heidi [name] is a pleasant 74 year old woman with history of breast cancer, followed by Dr. Ma Hillock who presents for evaluation of worsening shortness of breath.  She reports a long smoking history for 30 years, age 74 up to 60. She has completed numerous rounds of Adriamycin, finishing approximately 8 weeks ago, now on Taxol. She drink significant fluids in the daytime, does report some lower extremity edema. Lab work showing anemia with hematocrit 29 on 02/03/2015, up to 31 on 02/08/2015, normal renal function  Recent echocardiogram documenting ejection fraction 40-45% otherwise essentially a normal study Images reviewed by myself with EF estimated at 50-55%, some septal hypokinesis and in very select views, ejection fraction 40-45% secondary to septal hypokinesis Normal right ventricular systolic pressures  She reports that her shortness of breath has been getting worse over the past 3 months, one month in particular. Now only able to walk very short distances before having to stop to recover.  She feels that her weight has been stable despite eating less, drinking more She reports that she did 6 days of Lasix daily with low-dose potassium. She is uncertain if this helped her symptoms of shortness of breath  EKG on today's visit shows normal sinus rhythm with rate 96 bpm, PVCs noted  CT scan of the chest shows normal heart size, no pericardial effusion, scattered mild atherosclerosis of the aorta and great vessels  Allergies  Allergen Reactions  . Hydrochloric Acid     Other reaction(s): Unknown Uncoded Allergy. Allergen: HYDROCHLORLIC ACID.    Outpatient Encounter Prescriptions as of 02/16/2015  Medication Sig  . diphenoxylate-atropine (LOMOTIL) 2.5-0.025 MG per tablet Take 1 tablet by mouth as needed.  Marland Kitchen ibuprofen (ADVIL,MOTRIN) 200 MG tablet Take 200 mg by mouth every 6 (six) hours as  needed.  Marland Kitchen losartan (COZAAR) 50 MG tablet Take 150 mg by mouth daily.  Marland Kitchen oxyCODONE (OXY IR/ROXICODONE) 5 MG immediate release tablet Take 5 mg by mouth every 4 (four) hours as needed.  . sertraline (ZOLOFT) 100 MG tablet Take 100 mg by mouth daily.  Marland Kitchen zolpidem (AMBIEN) 10 MG tablet Take 10 mg by mouth at bedtime.  . furosemide (LASIX) 40 MG tablet Take 1 tablet (40 mg total) by mouth daily as needed.  . metoprolol succinate (TOPROL-XL) 50 MG 24 hr tablet Take 1 tablet (50 mg total) by mouth daily. Take with or immediately following a meal.  . potassium chloride SA (K-DUR,KLOR-CON) 20 MEQ tablet Take 1 tablet (20 mEq total) by mouth daily as needed.    Past Medical History  Diagnosis Date  . Hypertension   . Depression   . Hyperlipidemia   . Osteoarthritis   . Insomnia   . Cataracts, bilateral   . UTI (lower urinary tract infection)   . Breast cancer     1990    Past Surgical History  Procedure Laterality Date  . Mastectomy Right     lower 1990    Social History  reports that she has quit smoking. Her smoking use included Cigarettes. She has a 50 pack-year smoking history. She has never used smokeless tobacco. She reports that she does not drink alcohol or use illicit drugs.  Family History family history includes Bladder Cancer in her father; Dementia in her mother.    Review of Systems  Constitutional: Negative.   HENT: Negative.   Eyes: Negative.   Respiratory: Positive for  shortness of breath.   Cardiovascular: Positive for leg swelling.  Gastrointestinal: Negative.   Endocrine: Negative.   Musculoskeletal: Negative.   Skin: Negative.   Allergic/Immunologic: Negative.   Neurological: Negative.   Hematological: Negative.   Psychiatric/Behavioral: Negative.   All other systems reviewed and are negative.   BP 150/68 mmHg  Pulse 96  Ht 5\' 8"  (1.727 m)  Wt 238 lb 12 oz (108.296 kg)  BMI 36.31 kg/m2  Physical Exam  Constitutional: She is oriented to person,  place, and time. She appears well-developed and well-nourished.  Obese  HENT:  Head: Normocephalic.  Nose: Nose normal.  Mouth/Throat: Oropharynx is clear and moist.  Eyes: Conjunctivae are normal. Pupils are equal, round, and reactive to light.  Neck: Normal range of motion. Neck supple. No JVD present.  Cardiovascular: Normal rate, regular rhythm, S1 normal, S2 normal, normal heart sounds and intact distal pulses.  Exam reveals no gallop and no friction rub.   No murmur heard. Trace nonpitting edema bilateral lower extremities  Pulmonary/Chest: Effort normal and breath sounds normal. No respiratory distress. She has no wheezes. She has no rales. She exhibits no tenderness.  Abdominal: Soft. Bowel sounds are normal. She exhibits no distension. There is no tenderness.  Musculoskeletal: Normal range of motion. She exhibits no edema or tenderness.  Lymphadenopathy:    She has no cervical adenopathy.  Neurological: She is alert and oriented to person, place, and time. Coordination normal.  Skin: Skin is warm and dry. No rash noted. No erythema.  Psychiatric: She has a normal mood and affect. Her behavior is normal. Judgment and thought content normal.    Assessment and Plan  Nursing note and vitals reviewed.

## 2015-02-17 ENCOUNTER — Telehealth: Payer: Self-pay

## 2015-02-17 NOTE — Telephone Encounter (Signed)
Left message for pt that her Carlton Adam is sched for 02/19/15 @ 10:30, to arrive at the [facility] @ 10:15. Asked her to call back if this date & time are not convenient for her.

## 2015-02-17 NOTE — Addendum Note (Signed)
Addended by: Dede Query R on: 02/17/2015 10:20 AM   Modules accepted: Orders

## 2015-02-18 ENCOUNTER — Ambulatory Visit: Payer: Medicare Other

## 2015-02-18 ENCOUNTER — Other Ambulatory Visit: Payer: Self-pay | Admitting: *Deleted

## 2015-02-18 ENCOUNTER — Ambulatory Visit (INDEPENDENT_AMBULATORY_CARE_PROVIDER_SITE_OTHER): Payer: Medicare Other | Admitting: Internal Medicine

## 2015-02-18 DIAGNOSIS — R0609 Other forms of dyspnea: Secondary | ICD-10-CM

## 2015-02-18 LAB — PULMONARY FUNCTION TEST
DL/VA % pred: 57 %
DL/VA: 3.03 ml/min/mmHg/L
DLCO unc % pred: 43 %
DLCO unc: 12.93 ml/min/mmHg
FEF 25-75 PRE: 2.34 L/s
FEF 25-75 Post: 2.81 L/sec
FEF2575-%Change-Post: 20 %
FEF2575-%PRED-POST: 140 %
FEF2575-%PRED-PRE: 117 %
FEV1-%Change-Post: 2 %
FEV1-%Pred-Post: 90 %
FEV1-%Pred-Pre: 87 %
FEV1-POST: 2.32 L
FEV1-PRE: 2.26 L
FEV1FVC-%Change-Post: 5 %
FEV1FVC-%Pred-Pre: 108 %
FEV6-%Change-Post: -4 %
FEV6-%PRED-PRE: 85 %
FEV6-%Pred-Post: 82 %
FEV6-POST: 2.67 L
FEV6-Pre: 2.78 L
FEV6FVC-%PRED-POST: 104 %
FEV6FVC-%Pred-Pre: 104 %
FVC-%CHANGE-POST: -2 %
FVC-%PRED-PRE: 81 %
FVC-%Pred-Post: 79 %
FVC-Post: 2.71 L
FVC-Pre: 2.78 L
Post FEV1/FVC ratio: 86 %
Post FEV6/FVC ratio: 100 %
Pre FEV1/FVC ratio: 81 %
Pre FEV6/FVC Ratio: 100 %

## 2015-02-18 NOTE — Progress Notes (Signed)
PFT performed today with Nitrogen washout.  6MW not performed today due to patient being unable to walk.

## 2015-02-19 ENCOUNTER — Observation Stay
Admit: 2015-02-19 | Disposition: A | Discharge status: Home / Self Care | Payer: Self-pay | Attending: Internal Medicine | Admitting: Internal Medicine

## 2015-02-19 ENCOUNTER — Ambulatory Visit
Admit: 2015-02-19 | Disposition: A | Discharge status: Home / Self Care | Payer: Self-pay | Attending: Cardiovascular Disease | Admitting: Cardiovascular Disease

## 2015-02-19 DIAGNOSIS — R0602 Shortness of breath: Secondary | ICD-10-CM | POA: Diagnosis not present

## 2015-02-19 DIAGNOSIS — R0600 Dyspnea, unspecified: Secondary | ICD-10-CM | POA: Diagnosis not present

## 2015-02-19 DIAGNOSIS — Z9889 Other specified postprocedural states: Secondary | ICD-10-CM | POA: Diagnosis not present

## 2015-02-19 DIAGNOSIS — I2699 Other pulmonary embolism without acute cor pulmonale: Secondary | ICD-10-CM | POA: Diagnosis not present

## 2015-02-19 DIAGNOSIS — C50912 Malignant neoplasm of unspecified site of left female breast: Secondary | ICD-10-CM | POA: Diagnosis not present

## 2015-02-19 LAB — CBC
HCT: 31.2 % — ABNORMAL LOW (ref 35.0–47.0)
HGB: 10.1 g/dL — ABNORMAL LOW (ref 12.0–16.0)
MCH: 33.1 pg (ref 26.0–34.0)
MCHC: 32.4 g/dL (ref 32.0–36.0)
MCV: 102 fL — AB (ref 80–100)
PLATELETS: 256 10*3/uL (ref 150–440)
RBC: 3.05 10*6/uL — ABNORMAL LOW (ref 3.80–5.20)
RDW: 15.3 % — AB (ref 11.5–14.5)
WBC: 4.9 10*3/uL (ref 3.6–11.0)

## 2015-02-19 LAB — COMPREHENSIVE METABOLIC PANEL
ALBUMIN: 3.8 g/dL
AST: 21 U/L
Alkaline Phosphatase: 78 U/L
Anion Gap: 7 (ref 7–16)
BUN: 11 mg/dL
Bilirubin,Total: 0.8 mg/dL
CALCIUM: 8.5 mg/dL — AB
Chloride: 104 mmol/L
Co2: 25 mmol/L
Creatinine: 0.46 mg/dL
EGFR (African American): >60
EGFR (Non-African Amer.): >60
Glucose: 124 mg/dL — ABNORMAL HIGH
POTASSIUM: 4 mmol/L
SGPT (ALT): 19 U/L
SODIUM: 136 mmol/L
Total Protein: 6.3 g/dL — ABNORMAL LOW

## 2015-02-19 LAB — TROPONIN I: TROPONIN-I: 0.04 ng/mL — AB

## 2015-02-19 LAB — CK TOTAL AND CKMB (NOT AT ARMC)
CK, Total: 41 U/L
CK-MB: 1.8 ng/mL

## 2015-02-19 LAB — D-DIMER(ARMC): D-Dimer: 1091 ng/ml

## 2015-02-20 DIAGNOSIS — I10 Essential (primary) hypertension: Secondary | ICD-10-CM | POA: Diagnosis not present

## 2015-02-20 DIAGNOSIS — R0602 Shortness of breath: Secondary | ICD-10-CM | POA: Diagnosis not present

## 2015-02-20 DIAGNOSIS — Z9889 Other specified postprocedural states: Secondary | ICD-10-CM | POA: Diagnosis not present

## 2015-02-20 DIAGNOSIS — C50212 Malignant neoplasm of upper-inner quadrant of left female breast: Secondary | ICD-10-CM | POA: Diagnosis not present

## 2015-02-20 DIAGNOSIS — R0600 Dyspnea, unspecified: Secondary | ICD-10-CM | POA: Diagnosis not present

## 2015-02-20 DIAGNOSIS — C50912 Malignant neoplasm of unspecified site of left female breast: Secondary | ICD-10-CM | POA: Diagnosis not present

## 2015-02-20 DIAGNOSIS — I2699 Other pulmonary embolism without acute cor pulmonale: Secondary | ICD-10-CM | POA: Diagnosis not present

## 2015-02-20 DIAGNOSIS — E669 Obesity, unspecified: Secondary | ICD-10-CM | POA: Diagnosis not present

## 2015-02-20 NOTE — Op Note (Signed)
PATIENT NAME:  [name], Heidi K MR#:  [id number] DATE OF BIRTH:  10-20-1941  DATE OF PROCEDURE:  09/21/2014  PREOPERATIVE DIAGNOSIS: Breast carcinoma.   POSTOPERATIVE DIAGNOSIS: Breast carcinoma.   PROCEDURE: Power port placement.  SURGEON: Dia Crawford, MD  ANESTHESIA: General.  DESCRIPTION OF PROCEDURE: With the patient in the supine position, after the induction of appropriate general anesthesia, the patient's right chest was prepped with ChloraPrep and draped with sterile towels. The right neck was interrogated with the ultrasound prior to prepping and the jugular vein marked. The patient was placed in Trendelenburg position. The area in her neck was infiltrated with Xylocaine and a small mid neck incision made without difficulty. The ultrasound was then used to identify the compressible non-thrombotic jugular vein. The vein was cannulated on a single pass; however, the wire would not pass without fluoroscopic control. We felt that the needle was too far back in the vein so the procedure was reperformed. The vein was cannulated on the second pass and a wire passed into the great vessel system under fluoroscopic control without difficulty. A second incision was created on the chest wall using a transverse incision for the port placement. Subcutaneous suprafascial pocket was created without difficulty. A vein dilator and introducer were inserted over the wire and the wire and dilator removed. A heparin-filled catheter was inserted through the introducer without difficulty. It was then tunneled to the second incision. It was placed in the appropriate great vessel position using contrast. The catheter was shortened and attached to the upper filled Port-A-Cath device. Again, using contrast, the absence of any kinks or leaks was confirmed, and appropriate position was confirmed. The device was sutured to the chest wall using 3-0 silk. It was filled with heparinized saline. The subcutaneous space was obliterated  with 3-0 Vicryl, and the skin was closed with 4-0 nylon. Sterile dressings were applied. The patient returned to the recovery room having tolerated the procedure well. Sponge, instrument and needle counts were correct x2 in the operating room.  ____________________________ Rodena Goldmann III, MD rle:sb D: 09/21/2014 10:14:03 ET T: 09/21/2014 11:15:43 ET JOB#: [id number]  cc: Rodena Goldmann III, MD, <Dictator> Martie Lee. Oliva Bustard, MD Milinda Pointer Jacqualine Code, Lucas MD ELECTRONICALLY SIGNED 09/25/2014 21:18

## 2015-02-22 ENCOUNTER — Other Ambulatory Visit: Payer: Self-pay

## 2015-02-22 DIAGNOSIS — Z9221 Personal history of antineoplastic chemotherapy: Secondary | ICD-10-CM

## 2015-02-22 DIAGNOSIS — E669 Obesity, unspecified: Secondary | ICD-10-CM

## 2015-02-22 DIAGNOSIS — Z87891 Personal history of nicotine dependence: Secondary | ICD-10-CM

## 2015-02-22 DIAGNOSIS — R0609 Other forms of dyspnea: Secondary | ICD-10-CM

## 2015-02-22 DIAGNOSIS — C50919 Malignant neoplasm of unspecified site of unspecified female breast: Secondary | ICD-10-CM

## 2015-02-22 DIAGNOSIS — I10 Essential (primary) hypertension: Secondary | ICD-10-CM

## 2015-02-22 DIAGNOSIS — R0602 Shortness of breath: Secondary | ICD-10-CM

## 2015-02-22 LAB — CBC CANCER CENTER
Basophil #: 0 x10 3/mm (ref 0.0–0.1)
Basophil %: 0.9 %
EOS ABS: 0.1 x10 3/mm (ref 0.0–0.7)
EOS PCT: 1.6 %
HCT: 30 % — ABNORMAL LOW (ref 35.0–47.0)
HGB: 10 g/dL — AB (ref 12.0–16.0)
Lymphocyte #: 0.9 x10 3/mm — ABNORMAL LOW (ref 1.0–3.6)
Lymphocyte %: 22.3 %
MCH: 33.6 pg (ref 26.0–34.0)
MCHC: 33.3 g/dL (ref 32.0–36.0)
MCV: 101 fL — ABNORMAL HIGH (ref 80–100)
Monocyte #: 0.3 x10 3/mm (ref 0.2–0.9)
Monocyte %: 6.1 %
NEUTROS ABS: 2.9 x10 3/mm (ref 1.4–6.5)
Neutrophil %: 69.1 %
Platelet: 309 x10 3/mm (ref 150–440)
RBC: 2.98 10*6/uL — ABNORMAL LOW (ref 3.80–5.20)
RDW: 15.2 % — ABNORMAL HIGH (ref 11.5–14.5)
WBC: 4.2 x10 3/mm (ref 3.6–11.0)

## 2015-02-23 ENCOUNTER — Encounter: Payer: Self-pay | Admitting: Internal Medicine

## 2015-02-23 ENCOUNTER — Telehealth: Payer: Self-pay

## 2015-02-23 ENCOUNTER — Ambulatory Visit (INDEPENDENT_AMBULATORY_CARE_PROVIDER_SITE_OTHER): Payer: Medicare Other | Admitting: Internal Medicine

## 2015-02-23 VITALS — BP 110/72 | HR 78 | Temp 98.8°F | Ht 68.0 in | Wt 238.0 lb

## 2015-02-23 DIAGNOSIS — R0609 Other forms of dyspnea: Secondary | ICD-10-CM

## 2015-02-23 DIAGNOSIS — I2699 Other pulmonary embolism without acute cor pulmonale: Secondary | ICD-10-CM

## 2015-02-23 DIAGNOSIS — Z86711 Personal history of pulmonary embolism: Secondary | ICD-10-CM | POA: Insufficient documentation

## 2015-02-23 DIAGNOSIS — J439 Emphysema, unspecified: Secondary | ICD-10-CM | POA: Diagnosis not present

## 2015-02-23 MED ORDER — FLUTICASONE-SALMETEROL 250-50 MCG/DOSE IN AEPB: 1.0000 | INHALATION_SPRAY | Freq: Two times a day (BID) | RESPIRATORY_TRACT | Status: DC

## 2015-02-23 NOTE — Assessment & Plan Note (Signed)
Patient with Hx of breast cancer.  Plan - cont with Eloquis x 3 months - Repeat CTA Chest (PE Protocol) in 3 months, if segmental PE is resolved at that time will then stop anticoagulation.

## 2015-02-23 NOTE — Assessment & Plan Note (Signed)
COPD based on hx of smoking, chronic sob and air trapping noted on HRCT.  Will start Advair 250/50

## 2015-02-23 NOTE — Telephone Encounter (Signed)
Pt presented to office to see Dr. Stevenson Clinch and had a question about her cardiac meds.  I was unable to speak w/ pt at that time, as we had an emergency in the office.  Called and spoke w/ pt who seems to be confused about her new meds. Discussed w/ pt the purpose and dosing of her metoprolol & lasix.  Advised her to refer to AVS that was given at last ov, as this is also written out for her.  She is appreciative and will call back w/ any questions or concerns.

## 2015-02-23 NOTE — Patient Instructions (Addendum)
Follow up with Dr. Stevenson Clinch in 2 months - cont with Eloquis x 3 months - Repeat CTA Chest (PE Protocol) in 3 months, if segmental PE is resolved at that time will then stop anticoagulation.  - Advair 250/50 - 1puff twice a day, gargle and rinse after each use - exercise as tolerated once your strength has returned. - referral for cardio-pulmonary rehab

## 2015-02-23 NOTE — Progress Notes (Signed)
MRN# [id number] Heidi [name] May 22, 1941   CC: Chief Complaint  Patient presents with  . Follow-up    Pt has PFT done on last week, she was admitted to [facility] 02/19/15 for sob. She had Lexi scan on Friday and  CT scan as well. She is feeling much better today.      Brief History: HPI 01/21/15 Patient is a pleasant 74 year old female past medical history of left breast cancer, status post chemotherapy, hypertension, hyperlipidemia, insomnia, right total mastectomy in 1990, presenting for dyspnea on exertion. Patient states that his been exertion started about 3 years ago,however in the last 5-6 months it has been worst. No inciting factors 3 years ago. DOE coming from parking lot to waiting area. She denies cough, fever or night sweats in the last 2 months. Has a mild runny nose. No history of seasonal allergies. No recent sick contact Current chemo regiment is Taxol. Previously received adriamycin and cytoxan as part of neoadjuvant chemotherapy for left breast cancer.  In 2014 she saw her PMD for DOE, was given an "inhaler" which did not provide much relief.  Travelled to [place] in 07/2013, was on cruise, was able to perform daily walking with little discomfort at that time.  Plan - 15mwt, pft, HRCT  Events since last clinic visit: Patient presents today for a followup visit, she is accompanied by a friend. At her last visit she was evaluated for shortness of breath especially with exertion that has been gradually occurring for the past 3 years. Off note she has a history of breast cancer and completed a regimen of Adriamycin and Taxol. Since her last visit she had a [place] done which showed an ejection fraction of 48%, this is reduced compared to last ejection fraction. After her DEXA scan on last Friday she developed intense shortness of breath and recent back to the emergency room for further evaluation. A CTA of the chest was done that showed a mild/small subsegmental right upper lobe  pulmonary embolism. The patient was admitted for 24-hour observation and placed on a liquid, discharged in stable condition. Currently she is in a wheelchair, stating that she can walk, but feels extremely fatigued and short of breath; this is occurring at the time of discharge. Today patient endorses shortness of breath with exertion, but states that since her discharge she is actually becoming better and would like to start back with more walking and less use of her wheelchair. Today she also had a pulmonary function testing done but would like to discuss the results of these.    Recent Hospitalization 4/22-4/23 (Reviewed by Dr. Stevenson Clinch).  The patient is a 74 year old Caucasian female with a history of breast cancer and came to the ED for dyspnea for 2 weeks. The patient underwent a duplex of lower extremities, which did not show DVT. However, the patient's CAT scan of the chest with angiogram showed pulmonary embolism. The patient was treated with Lovenox last night and admitted for PE. For detailed history and physical examination, please refer to the admission note dictated by Dr. Marcille Blanco After admission, the patient's symptoms got better. She has no complaints. Physical examination is unremarkable. I discussed with Dr. Ma Hillock who suggested to start Eliquis or Xarelto. I started Eliquis 10 mg 1 dose in the morning and the patient will continue Eliquis 10 mg p.o. b.i.d. for 7 days, then change to 5 mg p.o. b.i.d. I discussed with the patient about the benefits and the side effects including bleeding of anticoagulation and discussed  with Dr. Ma Hillock. The patient is clinically stable and will be discharged to home today. Also discussed with the nurse and case manager. The patient is clinically stable and will be discharged to home today. Also discussed with the case manager about helping the patient to get Eliquis.   PMHX:   Past Medical History  Diagnosis Date  . Hypertension   . Depression   .  Hyperlipidemia   . Osteoarthritis   . Insomnia   . Cataracts, bilateral   . UTI (lower urinary tract infection)   . Breast cancer     1990   Surgical Hx:  Past Surgical History  Procedure Laterality Date  . Mastectomy Right     lower 1990   Family Hx:  Family History  Problem Relation Age of Onset  . Dementia Mother   . Bladder Cancer Father    Social Hx:   History  Substance Use Topics  . Smoking status: Former Smoker -- 2.00 packs/day for 25 years    Types: Cigarettes  . Smokeless tobacco: Never Used     Comment: Pt quit 1995  . Alcohol Use: No   Medication:   Current Outpatient Rx  Name  Route  Sig  Dispense  Refill  . acyclovir (ZOVIRAX) 400 MG tablet   Oral   Take 400 mg by mouth daily.         . diphenoxylate-atropine (LOMOTIL) 2.5-0.025 MG per tablet   Oral   Take 1 tablet by mouth as needed.         . furosemide (LASIX) 40 MG tablet   Oral   Take 1 tablet (40 mg total) by mouth daily as needed.   30 tablet   3   . ibuprofen (ADVIL,MOTRIN) 200 MG tablet   Oral   Take 200 mg by mouth every 6 (six) hours as needed.         Marland Kitchen losartan (COZAAR) 50 MG tablet   Oral   Take 150 mg by mouth daily.         . metoprolol succinate (TOPROL-XL) 50 MG 24 hr tablet   Oral   Take 1 tablet (50 mg total) by mouth daily. Take with or immediately following a meal.   30 tablet   3   . oxyCODONE (OXY IR/ROXICODONE) 5 MG immediate release tablet   Oral   Take 5 mg by mouth every 4 (four) hours as needed.         . potassium chloride (K-DUR,KLOR-CON) 10 MEQ tablet   Oral   Take 10 mEq by mouth as needed.         . potassium chloride SA (K-DUR,KLOR-CON) 20 MEQ tablet   Oral   Take 1 tablet (20 mEq total) by mouth daily as needed.   30 tablet   3   . sertraline (ZOLOFT) 100 MG tablet   Oral   Take 100 mg by mouth daily.         Marland Kitchen zolpidem (AMBIEN) 10 MG tablet   Oral   Take 10 mg by mouth at bedtime.            Review of Systems: Gen:   Denies  fever, sweats, chills HEENT: Denies blurred vision, double vision, ear pain, eye pain, hearing loss, nose bleeds, sore throat Cvc:  No dizziness, chest pain or heaviness Resp:   Denies cough or sputum porduction, shortness of breath Gi: Denies swallowing difficulty, stomach pain, nausea or vomiting, diarrhea, constipation, bowel incontinence Gu:  Denies  bladder incontinence, burning urine Ext:   No Joint pain, stiffness or swelling Skin: No skin rash, easy bruising or bleeding or hives Endoc:  No polyuria, polydipsia , polyphagia or weight change Psych: No depression, insomnia or hallucinations  Other:  All other systems negative  Allergies:  Hydrochloric acid  Physical Examination:  VS: BP 110/72 mmHg  Pulse 78  Temp(Src) 98.8 F (37.1 C) (Oral)  Ht 5\' 8"  (1.727 m)  Wt 238 lb (107.956 kg)  BMI 36.20 kg/m2  SpO2 91%  General Appearance: No distress  HEENT: PERRLA, EOM intact, no ptosis, no other lesions noticed Pulmonary:Exam: normal breath sounds., diaphragmatic excursion normal.No wheezing, No rales   Cardiovascular:@ Exam:  Normal S1,S2.  No m/r/g.     Abdomen:Exam: Benign, Soft, non-tender, No masses  Skin:   warm, no rashes, no ecchymosis  Extremities: normal, no cyanosis, clubbing, no edema, warm with normal capillary refill.   Labs results:  BMP Lab Results  Component Value Date   NA 140 10/12/2014   K 4.0 10/12/2014   CL 107 10/12/2014   CO2 27 10/12/2014   GLUCOSE 111* 10/12/2014   BUN 12 10/12/2014   CREATININE 0.81 10/12/2014     CBC CBC Latest Ref Rng 10/26/2014 10/19/2014 10/12/2014  WBC 3.6-11.0 x10 3/mm  1.1(LL) 6.2 7.4  Hemoglobin 12.0-16.0 g/dL 11.1(L) 12.0 11.8(L)  Hematocrit 35.0-47.0 % 33.7(L) 36.1 36.0  Platelets 150-440 x10 3/mm  165 397 320     Rad results: (The following images and results were reviewed by Dr. Stevenson Clinch). HRCT 02/11/15 CT CHEST WITHOUT CONTRAST  TECHNIQUE: Multidetector CT imaging of the chest was performed  following the standard protocol without IV contrast..  COMPARISON:  Radiographs 09/21/2014.  PET-CT 09/17/2014.  FINDINGS: Mediastinum/Nodes: There are no enlarged mediastinal, hilar or axillary lymph nodes. Hilar assessment is limited without contrast, although the hilar contours are stable. There are stable postsurgical changes status post right mastectomy and right axillary node dissection. There are no enlarged internal mammary lymph nodes. The thyroid gland, trachea and esophagus demonstrate no significant findings. The heart size is normal. There is no pericardial effusion.There is scattered mild atherosclerosis of the aorta and great vessels. Right IJ Port-A-Cath tip in the lower SVC. Lungs/Pleura: There is no pleural effusion. There are few scattered ground-glass densities in the lungs, but no suspicious pulmonary nodule, endobronchial lesion or confluent airspace opacity. High-resolution imaging was performed in inspiration and expiration. There is possible minimal air trapping on the expiratory images. There is no evidence of subpleural reticulation, interstitial prominence or bronchiectasis.  Upper abdomen:  Mild hepatic steatosis.  Otherwise unremarkable.  Musculoskeletal/Chest wall: There is no chest wall mass or suspicious osseous finding. Biopsy clip noted in the left breast without obvious surrounding mass. Old rib fracture on the left and multiple hemangiomas within the spine are noted. There is no evidence of metastatic disease.  IMPRESSION: 1. No evidence of local recurrence or metastatic disease status post bilateral breast cancer. 2. No acute findings. 3. Possible mild air trapping on expiratory imaging as can be seen with small airways disease. No interstitial lung disease or bronchiectasis demonstrated.  CT with Contrast 02/19/15 CT ANGIOGRAPHY CHEST WITH CONTRAST  TECHNIQUE: Multidetector CT imaging of the chest was performed using the standard  protocol during bolus administration of intravenous contrast. Multiplanar CT image reconstructions and MIPs were obtained to evaluate the vascular anatomy.  CONTRAST:  54.6 mL of Omnipaque 350 IV contrast  COMPARISON:  CT of the chest performed 02/11/2015, and chest radiograph performed  earlier today at 7:28 p.m.  FINDINGS: There is a relatively small pulmonary embolus within a segmental branch to the right upper lobe. No additional pulmonary embolus is seen.  Mild patchy atelectasis or scarring is noted within both lower lobes. Minimal infection cannot be excluded at the superior aspect of the right lower lobe. There is no evidence of pleural effusion or pneumothorax. No masses are identified; no abnormal focal contrast enhancement is seen.  The mediastinum is unremarkable in appearance. No mediastinal lymphadenopathy is seen. No pericardial effusion is identified. Scattered calcification is noted along the aortic arch and left subclavian artery. A right-sided chest port is noted.  The patient is status post right-sided mastectomy. No axillary lymphadenopathy is seen. The visualized portions of the thyroid gland are unremarkable in appearance.  The visualized portions of the liver and spleen are unremarkable. The visualized portions of the pancreas and adrenal glands are within normal limits.  No acute osseous abnormalities are seen. Vertebral body hemangiomas are noted at T3 and T11, and to a lesser extent at additional thoracic vertebral bodies.  Review of the MIP images confirms the above findings.   IMPRESSION: 1. Relatively small pulmonary embolus noted within a segmental branch to the right upper lung lobe. No additional pulmonary embolus seen. 2. Mild patchy atelectasis or scarring within both lower lobes. Minimal pneumonia cannot be excluded at the superior aspect of the right lower lobe. 3. Multiple thoracic vertebral body hemangiomas incidentally  noted.  Pulmonary function testing 02/18/2015 FEC 81% FEV1 87% FEV1/FVC 81 TLC 73 RV 64 DLCO 43 Impression: No significant obstructive disease, patient with normal FEV1 and FVC, however TLC is mildly to moderately reduced, showing some component of restriction. Severely reduced ERV, most likely secondary to deconditioning and abdominal/chest wall obesity. DLCO is severely reduced to 43%.  NM Stress Test 02/19/15  Overall impression: -Normal study imaging quality was deemed to be good. -The left ventricular global function was mildly reduced. -His myocardial perfusion scan showed evidence of pathology and has an abnormal appearance. The stress to rest volume ratio is 1.06 with a left ventricle. -Pharmacological myocardial perfusion study with moderate region of mild ischemia in the anterior territory. -There is GI uptake artifact noted on the study. -No significant wall motion of the mouth is noted. -Estimated ejection fraction is 40%. -There are are no EKG changes concerning for ischemia. -Overall, this is a moderate risk scan.    Assessment and Plan:74 year old female past medical history of breast cancer status post chemotherapy with Adriamycin and Taxol, presenting for followup visit of persistent dyspnea on exertion. Right subsegmental pulmonary embolus Patient with Hx of breast cancer.  Plan - cont with Eloquis x 3 months - Repeat CTA Chest (PE Protocol) in 3 months, if segmental PE is resolved at that time will then stop anticoagulation.    COPD type A COPD based on hx of smoking, chronic sob and air trapping noted on HRCT.  Will start Advair 250/50   DOE (dyspnea on exertion) Multifactorial - low EF, possible chemo related, deconditioning, obesity, mild to moderate restrictive disease, anemia, LV dysfunction  I believe that her shortness of breath with exertion is multifactorial in nature as stated above. I reviewed her CBCs over the last 4-5 months and noted that  there is a drop in her hemoglobin from 12 to about 9, also her ejection fraction from her MUGA scan in November 2015 compared to her echo in 2016 has reduced from 60% to about 45%, this was also confirmed on  her most recent stress test with a reduced ejection fraction of 48%, which could be adding to her dyspnea. Patient recent high-resolution scan with no significant findings of interstitial lung disease, there is some mild air trapping noted which is consistent with her prior history of smoking in the past. Patient will also be following with Cardiology.    Plan: - pulmonary function testing with no significant obstruction, mild to moderate restriction, most likely due to deconditioning and chest wall/abdominal obesity. - albuterol inhaler - 2puff every 3-4 hours as needed for shortness of breath\wheezing\recurrent cough - continue Advair Diskus - 1 puff twice a day (rinse and gargle after each use). - exercise as tolerated once your strength has returned. - referral for cardio-pulmonary rehab - continue with Cardiology follow up.        Updated Medication List Outpatient Encounter Prescriptions as of 02/23/2015  Medication Sig  . acyclovir (ZOVIRAX) 400 MG tablet Take 400 mg by mouth daily.  . diphenoxylate-atropine (LOMOTIL) 2.5-0.025 MG per tablet Take 1 tablet by mouth as needed.  . furosemide (LASIX) 40 MG tablet Take 1 tablet (40 mg total) by mouth daily as needed.  Marland Kitchen ibuprofen (ADVIL,MOTRIN) 200 MG tablet Take 200 mg by mouth every 6 (six) hours as needed.  Marland Kitchen losartan (COZAAR) 50 MG tablet Take 150 mg by mouth daily.  . metoprolol succinate (TOPROL-XL) 50 MG 24 hr tablet Take 1 tablet (50 mg total) by mouth daily. Take with or immediately following a meal.  . oxyCODONE (OXY IR/ROXICODONE) 5 MG immediate release tablet Take 5 mg by mouth every 4 (four) hours as needed.  . potassium chloride (K-DUR,KLOR-CON) 10 MEQ tablet Take 10 mEq by mouth as needed.  . potassium chloride SA  (K-DUR,KLOR-CON) 20 MEQ tablet Take 1 tablet (20 mEq total) by mouth daily as needed.  . sertraline (ZOLOFT) 100 MG tablet Take 100 mg by mouth daily.  Marland Kitchen zolpidem (AMBIEN) 10 MG tablet Take 10 mg by mouth at bedtime.  . Fluticasone-Salmeterol (ADVAIR DISKUS) 250-50 MCG/DOSE AEPB Inhale 1 puff into the lungs 2 (two) times daily. GARGLE AND RINSE AFTER EACH USE.    Orders for this visit: Orders Placed This Encounter  Procedures  . CT Angio Chest PE W/Cm &/Or Wo Cm    Standing Status: Future     Number of Occurrences:      Standing Expiration Date: 05/24/2016    Scheduling Instructions:     To be performed in 3 months    Order Specific Question:  Reason for Exam (SYMPTOM  OR DIAGNOSIS REQUIRED)    Answer:  Pulmonary Embolism    Order Specific Question:  Preferred imaging location?    Answer:  [facility]  . Ambulatory referral to Pulmonology    Referral Priority:  Routine    Referral Type:  Consultation    Referral Reason:  Specialty Services Required    Requested Specialty:  Pulmonary Disease    Number of Visits Requested:  1    Thank  you for the visitation and for allowing  [facility] to assist in the care of your patient. Our recommendations are noted above.  Please contact us if we can be of further service.  Vilinda Boehringer, MD [facility] Office Number: [phone]

## 2015-02-24 LAB — CREATININE, SERUM: CREATINE, SERUM: 0.9

## 2015-02-24 NOTE — Assessment & Plan Note (Addendum)
Multifactorial - low EF, possible chemo related, deconditioning, obesity, mild to moderate restrictive disease, anemia, LV dysfunction  I believe that her shortness of breath with exertion is multifactorial in nature as stated above. I reviewed her CBCs over the last 4-5 months and noted that there is a drop in her hemoglobin from 12 to about 9, also her ejection fraction from her MUGA scan in November 2015 compared to her echo in 2016 has reduced from 60% to about 45%, this was also confirmed on her most recent stress test with a reduced ejection fraction of 48%, which could be adding to her dyspnea. Patient recent high-resolution scan with no significant findings of interstitial lung disease, there is some mild air trapping noted which is consistent with her prior history of smoking in the past. Patient will also be following with Cardiology.    Plan: - pulmonary function testing with no significant obstruction, mild to moderate restriction, most likely due to deconditioning and chest wall/abdominal obesity. - albuterol inhaler - 2puff every 3-4 hours as needed for shortness of breath\wheezing\recurrent cough - continue Advair Diskus - 1 puff twice a day (rinse and gargle after each use). - exercise as tolerated once your strength has returned. - referral for cardio-pulmonary rehab - continue with Cardiology follow up.

## 2015-02-28 NOTE — H&P (Signed)
PATIENT NAME:  Heidi [name], Heidi [name] MR#:  [id number] DATE OF BIRTH:  05/31/1941  DATE OF ADMISSION:  02/19/2015  REFERRING PHYSICIAN: Elta Guadeloupe R. Jacqualine Code, MD  PRIMARY CARE PHYSICIAN: Milinda Pointer. Jacqualine Code, MD  ADMISSION DIAGNOSIS: Pulmonary embolism.   HISTORY OF PRESENT ILLNESS: This is a 74 year old Caucasian female who presents to the Emergency Department complaining of shortness of breath. The patient has had dyspnea for 2 weeks and had undergone Doppler ultrasound of her lower extremities to rule out DVT as ordered by her oncologist. This study was negative, but she has continued to have shortness of breath, which prompted her to undergo a cardiac stress test today. She has not had any chest pain, but states that she has become even more dyspneic following her nuclear stress test. In the Emergency Department, the patient underwent a CT angiogram of her chest, which revealed pulmonary embolism, which prompted the Emergency Department to call for admission.   REVIEW OF SYSTEMS:   CONSTITUTIONAL: The patient denies fever or weakness.  EYES: Denies blurred vision or inflammation.  EARS, NOSE AND THROAT: Denies tinnitus or sore throat.  RESPIRATORY: Admits to shortness of breath, but denies cough.  CARDIOVASCULAR: Denies chest pain, palpitations, orthopnea, paroxysmal nocturnal dyspnea.  GASTROINTESTINAL: Admits to nausea that began approximately 5 minutes prior examination. The patient has not had any episodes of vomiting or diarrhea recently.  GENITOURINARY: The patient denies dysuria, increased frequency or hesitancy of urination.  ENDOCRINE: Denies polyuria or polydipsia.  HEMATOLOGIC AND LYMPHATIC: Denies easy bruising or bleeding.  INTEGUMENTARY: Denies rashes or lesions.  MUSCULOSKELETAL: Denies arthralgias or myalgias.  NEUROLOGIC: Denies numbness in her extremities or dysarthria.  PSYCHIATRIC: Denies depression or suicidal ideation.   PAST MEDICAL HISTORY: The patient underwent treatment for breast  cancer of the right breast 25 years ago. She is currently undergoing chemotherapy for cancer of the left breast. She also has hypertension, hyperlipidemia, osteoarthritis, history of cataracts, and depression as well as insomnia.   PAST SURGICAL HISTORY: The patient has undergone port placement as well as a right mastectomy, and is scheduled for a left lumpectomy soon.   SOCIAL HISTORY: The patient lives alone. She does not drink or do any drugs. She quit smoking 23 years ago.   FAMILY HISTORY: Diabetes in multiple members of the family. The patient's father is deceased of bladder cancer. Her grandmother is deceased of congestive heart failure.   MEDICATIONS:  1.  Acyclovir 400 mg 1 tablet p.o. b.i.d.  2.  Atropine/diphenoxylate 0.25 mg/2.5 mg oral tablets 1 tablet p.o. every 6 hours as needed for diarrhea.  3.  Furosemide 40 mg 1 tablet p.o. daily as needed for lower extremity edema.  4.  Losartan 50 mg 1 tablet p.o. daily.  5.  Metoprolol succinate 50 mg extended release 1 tablet p.o. daily.  6.  Oxycodone 5 mg 1-2 tablets p.o. every 4 hours as needed for pain.  7.  Oxymetazoline nasal spray 0.05% solution 2 sprays to each nostril b.i.d. as needed for nasal congestion.  8.  Potassium chloride 20 mEq extended release 1 tablet p.o. daily when taking Lasix.  9.  Sertraline 100 mg 1-1/2 tablets p.o. at bedtime.  10.  Zolpidem 10 mg 1 tablet p.o. at bedtime.   ALLERGIES: BENADRYL.   PERTINENT LABORATORY RESULTS AND RADIOGRAPHIC FINDINGS: Serum glucose is 124, BUN 11, creatinine 0.46, serum sodium is 136, potassium is 4, chloride is 104, bicarbonate is 25, calcium is 8.5, serum albumin 3.8, alkaline phosphatase 78, AST 21, ALT 19. Troponin  is 0.04. White blood cell count is 4.9, hemoglobin is 10.1, hematocrit 31.2, platelet count is 256,000. MCV is 102. D-dimer was 1091. CT angiogram of the chest shows relatively small pulmonary embolus within the segmental branch of the right upper lung lobe.  There is some mild patchy atelectasis and scarring within both lower lobes. There are multiple thoracic vertebral body hemangiomas incidentally noted. A chest x-ray shows no active cardiopulmonary disease. Lexiscan shows suboptimal visualization due to intense GI uptake. There are no wall motion abnormalities. Scan is interpreted as moderate risk due to some mild ischemia in the anterior perfusion territory. The estimated ejection fraction is 48%. The left ventricular function is mildly reduced and there are no EKG changes concerning for current ischemia.   PHYSICAL EXAMINATION:  VITAL SIGNS: Temperature is 98.3, pulse 70, respirations 20, blood pressure is 114/75, pulse oximetry is 94% on room air.  GENERAL: The patient is alert and oriented x 3 in no apparent distress.  HEENT: Normocephalic, atraumatic. Pupils equal, round, and reactive to light and accommodation. Extraocular movements are intact. Mucous membranes are moist.  NECK: Trachea is midline. No adenopathy. Thyroid is nonpalpable and nontender.  CHEST: Symmetric and atraumatic. There is a port placed, clean and dressed and on the right chest.  CARDIOVASCULAR: Regular rate and rhythm. Normal S1, S2. No rubs, clicks, or murmurs appreciated.  LUNGS: Clear to auscultation bilaterally. Normal effort and excursion.  ABDOMEN: Positive bowel sounds. Soft, nontender, nondistended. No hepatosplenomegaly.  GENITOURINARY: Deferred.  MUSCULOSKELETAL: The patient moves all 4 extremities equally. There is 5/5 strength in upper and lower extremities bilaterally.  SKIN: Warm and dry. There are no rashes or lesions.  EXTREMITIES: No clubbing, cyanosis. There is trace edema of her ankles.  NEUROLOGIC: Cranial nerves II-XII are grossly intact.  PSYCHIATRIC: Mood is normal. Affect is congruent. The patient has excellent judgment and insight into her medical condition.   ASSESSMENT AND PLAN: This is a 74 year old female admitted for pulmonary embolus.  1.   Pulmonary embolus: The has been patient started on therapeutic Lovenox. A hematology and oncology consult has been placed, not only to update her primary oncologist on her condition, but also regarding his opinion on the new oral anticoagulant drugs and if they are appropriate for her current treatment. She has no hypoxia.  2.  Breast cancer: Her lesion is within the upper inner quadrant of the left breast. She is currently undergoing chemotherapy.  3.  Hypertension: We will continue metoprolol and losartan.  4.  Obesity: The patient's BMI is 36.2. I have encouraged a healthy diet.  5.  Deep vein thrombosis prophylaxis: The patient is on therapeutic Lovenox, as above.  6.  Gastrointestinal prophylaxis: None, as the patient is not critically ill.   CODE STATUS: The patient is a full code.   TIME SPENT ON ADMISSION AND PATIENT CARE: Approximately 35 minutes.    ____________________________ Norva Riffle. Marcille Blanco, MD msd:bm D: 02/20/2015 01:57:26 ET T: 02/20/2015 02:29:42 ET JOB#: [id number]  cc: Norva Riffle. Marcille Blanco, MD, <Dictator> Norva Riffle Camrin Lapre MD ELECTRONICALLY SIGNED 02/21/2015 3:33

## 2015-02-28 NOTE — Discharge Summary (Signed)
PATIENT NAME:  Heidi [name], Heidi [name] MR#:  [id number] DATE OF BIRTH:  Mar 09, 1941  DATE OF ADMISSION:  02/19/2015 DATE OF DISCHARGE:  02/20/2015  PRIMARY CARE PHYSICIAN: Fara Olden B. Jacqualine Code, MD   DISCHARGE DIAGNOSES:  1.  Pulmonary embolism.  2.  Breast cancer.  3.  Hypertension.  4.  Obesity.   CONDITION: Stable.   CODE STATUS: FULL CODE.   HOME MEDICATIONS: Please refer to the medication reconciliation list.   DIET: Low-sodium, low-fat, low-cholesterol diet.   ACTIVITY: As tolerated.   FOLLOWUP CARE: Follow up with PCP and Dr. Ma Hillock within 1 to 2 weeks.   REASON FOR ADMISSION: Shortness of breath.   HOSPITAL COURSE: The patient is a 74 year old Caucasian female with a history of breast cancer and came to the ED for dyspnea for 2 weeks. The patient underwent a duplex of lower extremities, which did not show DVT. However, the patient's CAT scan of the chest with angiogram showed pulmonary embolism. The patient was treated with Lovenox last night and admitted for PE. For detailed history and physical examination, please refer to the admission note dictated by Dr. Marcille Blanco After admission, the patient's symptoms got better. She has no complaints. Physical examination is unremarkable. I discussed with Dr. Ma Hillock who suggested to start Eliquis or Xarelto. I started Eliquis 10 mg 1 dose in the morning and the patient will continue Eliquis 10 mg p.o. b.i.d. for 7 days, then change to 5 mg p.o. b.i.d. I discussed with the patient about the benefits and the side effects including bleeding of anticoagulation and discussed with Dr. Ma Hillock. The patient is clinically stable and will be discharged to home today. Also discussed with the nurse and case manager. The patient is clinically stable and will be discharged to home today. Also discussed with the case manager about helping the patient to get Eliquis.   TIME SPENT: About 42 minutes.    ____________________________ Demetrios Loll, MD qc:TT D: 02/20/2015  15:18:10 ET T: 02/21/2015 01:53:08 ET JOB#: [id number]  cc: Demetrios Loll, MD, <Dictator> Demetrios Loll MD ELECTRONICALLY SIGNED 02/21/2015 14:29

## 2015-03-01 ENCOUNTER — Other Ambulatory Visit: Payer: Self-pay | Admitting: *Deleted

## 2015-03-01 ENCOUNTER — Ambulatory Visit: Payer: Medicare Other

## 2015-03-01 ENCOUNTER — Other Ambulatory Visit: Payer: Medicare Other

## 2015-03-01 ENCOUNTER — Encounter: Payer: Self-pay | Admitting: Cardiovascular Disease

## 2015-03-01 ENCOUNTER — Ambulatory Visit (INDEPENDENT_AMBULATORY_CARE_PROVIDER_SITE_OTHER): Payer: Medicare Other | Admitting: Cardiovascular Disease

## 2015-03-01 VITALS — BP 100/66 | HR 85 | Ht 68.0 in | Wt 232.8 lb

## 2015-03-01 DIAGNOSIS — R0609 Other forms of dyspnea: Secondary | ICD-10-CM

## 2015-03-01 DIAGNOSIS — R9439 Abnormal result of other cardiovascular function study: Secondary | ICD-10-CM

## 2015-03-01 DIAGNOSIS — J439 Emphysema, unspecified: Secondary | ICD-10-CM

## 2015-03-01 DIAGNOSIS — I2699 Other pulmonary embolism without acute cor pulmonale: Secondary | ICD-10-CM

## 2015-03-01 DIAGNOSIS — C773 Secondary and unspecified malignant neoplasm of axilla and upper limb lymph nodes: Principal | ICD-10-CM

## 2015-03-01 DIAGNOSIS — C50912 Malignant neoplasm of unspecified site of left female breast: Secondary | ICD-10-CM

## 2015-03-01 DIAGNOSIS — I10 Essential (primary) hypertension: Secondary | ICD-10-CM | POA: Diagnosis not present

## 2015-03-01 MED ORDER — APIXABAN 5 MG PO TABS: 5.0000 mg | ORAL_TABLET | Freq: Two times a day (BID) | ORAL | Status: DC

## 2015-03-01 MED ORDER — ALBUTEROL SULFATE HFA 108 (90 BASE) MCG/ACT IN AERS: 2.0000 | INHALATION_SPRAY | RESPIRATORY_TRACT | Status: DC | PRN

## 2015-03-01 NOTE — Progress Notes (Signed)
Patient ID: Heidi [name], female    DOB: [birth year], 74 y.o.   MRN: [id number]  HPI Comments: Heidi [name] is a pleasant 74 year old woman with history of breast cancer, followed by Dr. Ma Hillock previously seen for shortness of breath who follows up today to discuss her stress test results.  In follow-up today, she reports that her breathing has improved. She's been taking Lasix every other day, metoprolol, and Advair She was found to have PE in the past 2 weeks since her last clinic visit and was started on eliquis. PE was confirmed on CT scan. On these medications, she reports that she feels better  Stress test showed a defect in the anterior wall concerning for ischemia though there was GI artifact noted making the results challenging to read. EKG on today's visit shows normal sinus rhythm with rate 85 bpm, no significant ST or T-wave changes  Other past medical history  She reports a long smoking history for 30 years, age 74 up to 53. She has completed numerous rounds of Adriamycin, finishing approximately 8 weeks ago, now on Taxol. She drink significant fluids in the daytime, does report some lower extremity edema. Lab work showing anemia with hematocrit 29 on 02/03/2015, up to 31 on 02/08/2015, normal renal function   echocardiogram documenting ejection fraction 40-45% otherwise essentially a normal study Images reviewed by myself with EF estimated at 50-55%, some septal hypokinesis and in very select views, ejection fraction 40-45% secondary to septal hypokinesis Normal right ventricular systolic pressures  CT scan of the chest shows normal heart size, no pericardial effusion, scattered mild atherosclerosis of the aorta and great vessels  Allergies  Allergen Reactions  . Diphenhydramine Other (See Comments)    pt states makes legs very restless and ache  . Hydrochloric Acid     Other reaction(s): Unknown Uncoded Allergy. Allergen: HYDROCHLORLIC ACID.    Current Outpatient  Prescriptions on File Prior to Visit  Medication Sig Dispense Refill  . acyclovir (ZOVIRAX) 400 MG tablet Take 400 mg by mouth daily.    . diphenoxylate-atropine (LOMOTIL) 2.5-0.025 MG per tablet Take 1 tablet by mouth as needed.    . Fluticasone-Salmeterol (ADVAIR DISKUS) 250-50 MCG/DOSE AEPB Inhale 1 puff into the lungs 2 (two) times daily. GARGLE AND RINSE AFTER EACH USE. 60 each 3  . furosemide (LASIX) 40 MG tablet Take 1 tablet (40 mg total) by mouth daily as needed. 30 tablet 3  . ibuprofen (ADVIL,MOTRIN) 200 MG tablet Take 200 mg by mouth every 6 (six) hours as needed.    Marland Kitchen losartan (COZAAR) 50 MG tablet Take 50 mg by mouth daily.     . metoprolol succinate (TOPROL-XL) 50 MG 24 hr tablet Take 1 tablet (50 mg total) by mouth daily. Take with or immediately following a meal. 30 tablet 3  . oxyCODONE (OXY IR/ROXICODONE) 5 MG immediate release tablet Take 5 mg by mouth every 4 (four) hours as needed.    Marland Kitchen oxymetazoline (AFRIN) 0.05 % nasal spray Place into the nose.    . potassium chloride (K-DUR,KLOR-CON) 10 MEQ tablet Take 10 mEq by mouth as needed.    Marland Kitchen POTASSIUM CHLORIDE PO Take by mouth.    . potassium chloride SA (K-DUR,KLOR-CON) 20 MEQ tablet Take 1 tablet (20 mEq total) by mouth daily as needed. 30 tablet 3  . sertraline (ZOLOFT) 100 MG tablet Take 100 mg by mouth daily.    Marland Kitchen zolpidem (AMBIEN) 10 MG tablet Take 10 mg by mouth at bedtime.  No current facility-administered medications on file prior to visit.       Past Medical History  Diagnosis Date  . Hypertension   . Depression   . Hyperlipidemia   . Osteoarthritis   . Insomnia   . Cataracts, bilateral   . UTI (lower urinary tract infection)   . Breast cancer     1990    Past Surgical History  Procedure Laterality Date  . Mastectomy Right     lower 1990    Social History  reports that she has quit smoking. Her smoking use included Cigarettes. She has a 50 pack-year smoking history. She has never used smokeless  tobacco. She reports that she does not drink alcohol or use illicit drugs.  Family History family history includes Bladder Cancer in her father; Dementia in her mother.   Review of Systems  Constitutional: Negative.   Respiratory:       Mild shortness of breath, improved  Cardiovascular: Negative.   Gastrointestinal: Negative.   Musculoskeletal: Negative.   Skin: Negative.   Neurological: Negative.   Hematological: Negative.   Psychiatric/Behavioral: Negative.   All other systems reviewed and are negative.   BP 100/66 mmHg  Pulse 85  Ht 5\' 8"  (1.727 m)  Wt 232 lb 12 oz (105.575 kg)  BMI 35.40 kg/m2  Physical Exam  Constitutional: She is oriented to person, place, and time. She appears well-developed and well-nourished.  Obese  HENT:  Head: Normocephalic.  Nose: Nose normal.  Mouth/Throat: Oropharynx is clear and moist.  Eyes: Conjunctivae are normal. Pupils are equal, round, and reactive to light.  Neck: Normal range of motion. Neck supple. No JVD present.  Cardiovascular: Normal rate, regular rhythm, S1 normal, S2 normal, normal heart sounds and intact distal pulses.  Exam reveals no gallop and no friction rub.   No murmur heard. Pulmonary/Chest: Effort normal and breath sounds normal. No respiratory distress. She has no wheezes. She has no rales. She exhibits no tenderness.  Abdominal: Soft. Bowel sounds are normal. She exhibits no distension. There is no tenderness.  Musculoskeletal: Normal range of motion. She exhibits no edema or tenderness.  Lymphadenopathy:    She has no cervical adenopathy.  Neurological: She is alert and oriented to person, place, and time. Coordination normal.  Skin: Skin is warm and dry. No rash noted. No erythema.  Psychiatric: She has a normal mood and affect. Her behavior is normal. Judgment and thought content normal.    Assessment and Plan  Nursing note and vitals reviewed.

## 2015-03-01 NOTE — Patient Instructions (Addendum)
You are doing well.  Please consider cardiac cath if shortness of breath gets worse  Stay on the eliquis 5 mg twice a day (dose has decreased)  Continue advair, lasix every other day, metoprolol Use albuterol inh as needed for shortness of breath  Please call us if you have new issues that need to be addressed before your next appt.  Your physician wants you to follow-up in: 6 months.  You will receive a reminder letter in the mail two months in advance. If you don't receive a letter, please call our office to schedule the follow-up appointment.

## 2015-03-01 NOTE — Assessment & Plan Note (Signed)
I suspect her shortness of breath was multifactorial including diastolic CHF, tachycardia, COPD/emphysema Now with recent PE. Symptoms have improved on current medications. Unable to exclude small region of ischemia. She will discuss cardiac catheterization with her daughter. She is reluctant to proceed as she is feeling somewhat better

## 2015-03-01 NOTE — Assessment & Plan Note (Signed)
Results reviewed with her in detail. Unable to exclude ischemia. We have recommended that she call us if symptoms of shortness of breath gets worse. Cardiac catheterization would be ordered

## 2015-03-01 NOTE — Assessment & Plan Note (Signed)
Blood pressure is well controlled on today's visit. No changes made to the medications. 

## 2015-03-01 NOTE — Assessment & Plan Note (Signed)
She is out of anticoagulation medication, we have given her samples of eliquis 5 mg twice a day. Also written a new prescription

## 2015-03-01 NOTE — Assessment & Plan Note (Signed)
She reports that breathing is better on Advair. We have also given her a prescription for albuterol to take as needed

## 2015-03-02 ENCOUNTER — Inpatient Hospital Stay: Payer: Medicare Other | Attending: Internal Medicine | Admitting: *Deleted

## 2015-03-02 ENCOUNTER — Inpatient Hospital Stay (HOSPITAL_BASED_OUTPATIENT_CLINIC_OR_DEPARTMENT_OTHER): Payer: Medicare Other | Admitting: Internal Medicine

## 2015-03-02 ENCOUNTER — Inpatient Hospital Stay: Payer: Medicare Other

## 2015-03-02 VITALS — BP 113/69 | HR 82 | Temp 97.6°F | Ht 68.0 in | Wt 234.6 lb

## 2015-03-02 DIAGNOSIS — Z853 Personal history of malignant neoplasm of breast: Secondary | ICD-10-CM | POA: Diagnosis not present

## 2015-03-02 DIAGNOSIS — Z9011 Acquired absence of right breast and nipple: Secondary | ICD-10-CM | POA: Insufficient documentation

## 2015-03-02 DIAGNOSIS — Z5111 Encounter for antineoplastic chemotherapy: Secondary | ICD-10-CM | POA: Insufficient documentation

## 2015-03-02 DIAGNOSIS — D649 Anemia, unspecified: Secondary | ICD-10-CM | POA: Diagnosis not present

## 2015-03-02 DIAGNOSIS — C50912 Malignant neoplasm of unspecified site of left female breast: Secondary | ICD-10-CM

## 2015-03-02 DIAGNOSIS — C50212 Malignant neoplasm of upper-inner quadrant of left female breast: Secondary | ICD-10-CM

## 2015-03-02 DIAGNOSIS — T451X5S Adverse effect of antineoplastic and immunosuppressive drugs, sequela: Secondary | ICD-10-CM | POA: Insufficient documentation

## 2015-03-02 DIAGNOSIS — C773 Secondary and unspecified malignant neoplasm of axilla and upper limb lymph nodes: Secondary | ICD-10-CM

## 2015-03-02 DIAGNOSIS — I10 Essential (primary) hypertension: Secondary | ICD-10-CM | POA: Insufficient documentation

## 2015-03-02 DIAGNOSIS — R0600 Dyspnea, unspecified: Secondary | ICD-10-CM | POA: Diagnosis not present

## 2015-03-02 DIAGNOSIS — Z7901 Long term (current) use of anticoagulants: Secondary | ICD-10-CM | POA: Insufficient documentation

## 2015-03-02 DIAGNOSIS — Z170 Estrogen receptor positive status [ER+]: Secondary | ICD-10-CM | POA: Insufficient documentation

## 2015-03-02 DIAGNOSIS — I2699 Other pulmonary embolism without acute cor pulmonale: Secondary | ICD-10-CM

## 2015-03-02 DIAGNOSIS — Z79899 Other long term (current) drug therapy: Secondary | ICD-10-CM | POA: Insufficient documentation

## 2015-03-02 DIAGNOSIS — D701 Agranulocytosis secondary to cancer chemotherapy: Secondary | ICD-10-CM | POA: Diagnosis not present

## 2015-03-02 DIAGNOSIS — E785 Hyperlipidemia, unspecified: Secondary | ICD-10-CM | POA: Insufficient documentation

## 2015-03-02 DIAGNOSIS — Z86711 Personal history of pulmonary embolism: Secondary | ICD-10-CM | POA: Insufficient documentation

## 2015-03-02 DIAGNOSIS — M1990 Unspecified osteoarthritis, unspecified site: Secondary | ICD-10-CM | POA: Diagnosis not present

## 2015-03-02 LAB — CBC WITH DIFFERENTIAL/PLATELET
BASOS ABS: 0 10*3/uL (ref 0–0.1)
Basophils Relative: 1 %
Eosinophils Absolute: 0.1 10*3/uL (ref 0–0.7)
Eosinophils Relative: 1 %
HCT: 31 % (ref 36.0–46.0)
Hemoglobin: 10.2 g/dL (ref 12.0–15.0)
Lymphocytes Relative: 17 %
Lymphs Abs: 1 10*3/uL (ref 1.0–3.6)
MCH: 32.9 pg (ref 26.0–34.0)
MCHC: 32.9 g/dL (ref 30.0–36.0)
MCV: 100 fL (ref 78.0–100.0)
MONO ABS: 0.7 10*3/uL (ref 0.2–0.9)
Monocytes Relative: 12 %
Neutro Abs: 4.1 10*3/uL (ref 1.4–6.5)
Neutrophils Relative %: 69 %
Platelets: 305 10*3/uL (ref 150–400)
RBC: 3.1 MIL/uL (ref 3.87–5.11)
RDW: 15.5 % (ref 11.5–15.5)
WBC: 6 10*3/uL (ref 4.0–10.5)

## 2015-03-02 LAB — CREATININE, SERUM: CREATININE: 0.79 mg/dL (ref 0.44–1.00)

## 2015-03-02 LAB — HEPATIC FUNCTION PANEL
ALK PHOS: 62 U/L (ref 38–126)
ALT: 18 U/L (ref 14–54)
AST: 18 U/L (ref 15–41)
Albumin: 4 g/dL (ref 3.5–5.0)
BILIRUBIN DIRECT: 0.1 mg/dL (ref 0.1–0.5)
BILIRUBIN INDIRECT: 0.4 mg/dL (ref 0.3–0.9)
Total Bilirubin: 0.5 mg/dL (ref 0.3–1.2)
Total Protein: 6.6 g/dL (ref 6.5–8.1)

## 2015-03-02 MED ORDER — HEPARIN SOD (PORK) LOCK FLUSH 100 UNIT/ML IV SOLN
500.0000 [IU] | Freq: Once | INTRAVENOUS | Status: AC | PRN
  Administered 2015-03-02: 500 [IU]
  Filled 2015-03-02: qty 5

## 2015-03-02 MED ORDER — SODIUM CHLORIDE 0.9 % IV SOLN
Freq: Once | INTRAVENOUS | Status: AC
  Administered 2015-03-02: 17:00:00 via INTRAVENOUS
  Filled 2015-03-02: qty 4

## 2015-03-02 MED ORDER — SODIUM CHLORIDE 0.9 % IV SOLN
Freq: Once | INTRAVENOUS | Status: AC
  Administered 2015-03-02: 17:00:00 via INTRAVENOUS
  Filled 2015-03-02: qty 250

## 2015-03-02 MED ORDER — FAMOTIDINE IN NACL 20-0.9 MG/50ML-% IV SOLN
20.0000 mg | Freq: Once | INTRAVENOUS | Status: AC
  Administered 2015-03-02: 20 mg via INTRAVENOUS
  Filled 2015-03-02: qty 50

## 2015-03-02 MED ORDER — DIPHENHYDRAMINE HCL 50 MG/ML IJ SOLN
12.5000 mg | Freq: Once | INTRAMUSCULAR | Status: AC
  Administered 2015-03-02: 12.5 mg via INTRAVENOUS
  Filled 2015-03-02: qty 1

## 2015-03-02 MED ORDER — DEXTROSE 5 % IV SOLN
60.0000 mg/m2 | Freq: Once | INTRAVENOUS | Status: AC
  Administered 2015-03-02: 138 mg via INTRAVENOUS
  Filled 2015-03-02: qty 23

## 2015-03-02 MED ORDER — SODIUM CHLORIDE 0.9 % IJ SOLN
10.0000 mL | INTRAMUSCULAR | Status: DC | PRN
  Administered 2015-03-02: 10 mL
  Filled 2015-03-02: qty 10

## 2015-03-02 NOTE — Patient Instructions (Signed)
[facility] Discharge Instructions for Patients Receiving Chemotherapy  Today you received the following chemotherapy agents Taxol.  To help prevent nausea and vomiting after your treatment, we encourage you to take your nausea medication as prescribed.   If you develop nausea and vomiting that is not controlled by your nausea medication, call the clinic.   BELOW ARE SYMPTOMS THAT SHOULD BE REPORTED IMMEDIATELY:  *FEVER GREATER THAN 100.5 F  *CHILLS WITH OR WITHOUT FEVER  NAUSEA AND VOMITING THAT IS NOT CONTROLLED WITH YOUR NAUSEA MEDICATION  *UNUSUAL SHORTNESS OF BREATH  *UNUSUAL BRUISING OR BLEEDING  TENDERNESS IN MOUTH AND THROAT WITH OR WITHOUT PRESENCE OF ULCERS  *URINARY PROBLEMS  *BOWEL PROBLEMS  UNUSUAL RASH Items with * indicate a potential emergency and should be followed up as soon as possible.  Feel free to call the clinic you have any questions or concerns. The clinic phone number is [phone].

## 2015-03-04 ENCOUNTER — Telehealth: Payer: Self-pay

## 2015-03-04 NOTE — Telephone Encounter (Signed)
Spoke with Fernande Boyden regarding a prior authorization for the Eliquis which is approved.

## 2015-03-05 ENCOUNTER — Telehealth: Payer: Self-pay | Admitting: *Deleted

## 2015-03-05 NOTE — Telephone Encounter (Signed)
Patient notified that rx is ready to pick up

## 2015-03-05 NOTE — Telephone Encounter (Signed)
States she thought she could do without her meds, but her legs are hurting and she cannot sleep. States she has no PMD and wants to know if Dr Ma Hillock will refill her Oxycodone and Ambien

## 2015-03-06 ENCOUNTER — Other Ambulatory Visit: Payer: Self-pay | Admitting: *Deleted

## 2015-03-08 ENCOUNTER — Other Ambulatory Visit: Payer: Medicare Other

## 2015-03-08 ENCOUNTER — Ambulatory Visit: Payer: Medicare Other | Admitting: Internal Medicine

## 2015-03-08 ENCOUNTER — Ambulatory Visit: Payer: Medicare Other

## 2015-03-09 ENCOUNTER — Other Ambulatory Visit: Payer: Medicare Other

## 2015-03-09 ENCOUNTER — Ambulatory Visit: Payer: Medicare Other

## 2015-03-09 ENCOUNTER — Inpatient Hospital Stay: Payer: Medicare Other

## 2015-03-09 VITALS — BP 120/77 | HR 84 | Temp 97.8°F | Resp 18

## 2015-03-09 DIAGNOSIS — C50212 Malignant neoplasm of upper-inner quadrant of left female breast: Secondary | ICD-10-CM | POA: Diagnosis not present

## 2015-03-09 DIAGNOSIS — C50912 Malignant neoplasm of unspecified site of left female breast: Secondary | ICD-10-CM

## 2015-03-09 LAB — CBC WITH DIFFERENTIAL/PLATELET
BASOS PCT: 1 %
Basophils Absolute: 0 10*3/uL (ref 0–0.1)
Eosinophils Absolute: 0.2 10*3/uL (ref 0–0.7)
Eosinophils Relative: 2 %
HCT: 28.8 % — ABNORMAL LOW (ref 35.0–47.0)
Hemoglobin: 9.6 g/dL — ABNORMAL LOW (ref 12.0–16.0)
Lymphocytes Relative: 12 %
Lymphs Abs: 1 10*3/uL (ref 1.0–3.6)
MCH: 32.9 pg (ref 26.0–34.0)
MCHC: 33.3 g/dL (ref 32.0–36.0)
MCV: 98.7 fL (ref 80.0–100.0)
MONO ABS: 0.4 10*3/uL (ref 0.2–0.9)
Monocytes Relative: 5 %
Neutro Abs: 6.7 10*3/uL — ABNORMAL HIGH (ref 1.4–6.5)
Neutrophils Relative %: 80 %
Platelets: 275 10*3/uL (ref 150–440)
RBC: 2.92 MIL/uL — ABNORMAL LOW (ref 3.80–5.20)
RDW: 15.8 % — ABNORMAL HIGH (ref 11.5–14.5)
WBC: 8.3 10*3/uL (ref 3.6–11.0)

## 2015-03-09 MED ORDER — SODIUM CHLORIDE 0.9 % IV SOLN
Freq: Once | INTRAVENOUS | Status: AC
  Administered 2015-03-09: 15:00:00 via INTRAVENOUS
  Filled 2015-03-09: qty 250

## 2015-03-09 MED ORDER — SODIUM CHLORIDE 0.9 % IV SOLN
Freq: Once | INTRAVENOUS | Status: AC
  Administered 2015-03-09: 15:00:00 via INTRAVENOUS
  Filled 2015-03-09: qty 4

## 2015-03-09 MED ORDER — PACLITAXEL CHEMO INJECTION 300 MG/50ML
60.0000 mg/m2 | Freq: Once | INTRAVENOUS | Status: AC
  Administered 2015-03-09: 138 mg via INTRAVENOUS
  Filled 2015-03-09: qty 23

## 2015-03-09 MED ORDER — SODIUM CHLORIDE 0.9 % IJ SOLN
10.0000 mL | INTRAMUSCULAR | Status: DC | PRN
  Filled 2015-03-09: qty 10

## 2015-03-09 MED ORDER — FAMOTIDINE IN NACL 20-0.9 MG/50ML-% IV SOLN
20.0000 mg | Freq: Once | INTRAVENOUS | Status: AC
  Administered 2015-03-09: 20 mg via INTRAVENOUS
  Filled 2015-03-09: qty 50

## 2015-03-09 MED ORDER — HEPARIN SOD (PORK) LOCK FLUSH 100 UNIT/ML IV SOLN
500.0000 [IU] | Freq: Once | INTRAVENOUS | Status: AC
  Administered 2015-03-09: 500 [IU] via INTRAVENOUS
  Filled 2015-03-09: qty 5

## 2015-03-09 MED ORDER — DIPHENHYDRAMINE HCL 50 MG/ML IJ SOLN
12.5000 mg | Freq: Once | INTRAMUSCULAR | Status: AC
  Administered 2015-03-09: 12.5 mg via INTRAVENOUS
  Filled 2015-03-09: qty 1

## 2015-03-11 ENCOUNTER — Telehealth: Payer: Self-pay | Admitting: Internal Medicine

## 2015-03-11 NOTE — Telephone Encounter (Signed)
Spoke with pt. Advised her [facility] should be contacting her about her appointment. She states that she will await their phone call. Nothing further was needed.

## 2015-03-16 ENCOUNTER — Inpatient Hospital Stay (HOSPITAL_BASED_OUTPATIENT_CLINIC_OR_DEPARTMENT_OTHER): Payer: Medicare Other | Admitting: Internal Medicine

## 2015-03-16 ENCOUNTER — Inpatient Hospital Stay: Payer: Medicare Other | Admitting: *Deleted

## 2015-03-16 ENCOUNTER — Ambulatory Visit: Payer: Medicare Other | Admitting: Internal Medicine

## 2015-03-16 ENCOUNTER — Ambulatory Visit: Payer: Medicare Other

## 2015-03-16 ENCOUNTER — Other Ambulatory Visit: Payer: Medicare Other

## 2015-03-16 ENCOUNTER — Inpatient Hospital Stay: Payer: Medicare Other

## 2015-03-16 VITALS — BP 120/86 | HR 92 | Temp 98.2°F | Ht 68.0 in | Wt 227.3 lb

## 2015-03-16 DIAGNOSIS — C50212 Malignant neoplasm of upper-inner quadrant of left female breast: Secondary | ICD-10-CM

## 2015-03-16 DIAGNOSIS — R0600 Dyspnea, unspecified: Secondary | ICD-10-CM

## 2015-03-16 DIAGNOSIS — D701 Agranulocytosis secondary to cancer chemotherapy: Secondary | ICD-10-CM | POA: Diagnosis not present

## 2015-03-16 DIAGNOSIS — Z79899 Other long term (current) drug therapy: Secondary | ICD-10-CM

## 2015-03-16 DIAGNOSIS — Z170 Estrogen receptor positive status [ER+]: Secondary | ICD-10-CM

## 2015-03-16 DIAGNOSIS — C50912 Malignant neoplasm of unspecified site of left female breast: Secondary | ICD-10-CM

## 2015-03-16 DIAGNOSIS — Z86711 Personal history of pulmonary embolism: Secondary | ICD-10-CM

## 2015-03-16 DIAGNOSIS — T451X5S Adverse effect of antineoplastic and immunosuppressive drugs, sequela: Secondary | ICD-10-CM

## 2015-03-16 DIAGNOSIS — D649 Anemia, unspecified: Secondary | ICD-10-CM

## 2015-03-16 DIAGNOSIS — I10 Essential (primary) hypertension: Secondary | ICD-10-CM

## 2015-03-16 DIAGNOSIS — E785 Hyperlipidemia, unspecified: Secondary | ICD-10-CM

## 2015-03-16 DIAGNOSIS — C773 Secondary and unspecified malignant neoplasm of axilla and upper limb lymph nodes: Secondary | ICD-10-CM

## 2015-03-16 DIAGNOSIS — M1990 Unspecified osteoarthritis, unspecified site: Secondary | ICD-10-CM

## 2015-03-16 LAB — CBC WITH DIFFERENTIAL/PLATELET
BASOS PCT: 1 %
Basophils Absolute: 0 10*3/uL (ref 0–0.1)
EOS PCT: 1 %
Eosinophils Absolute: 0.1 10*3/uL (ref 0–0.7)
HEMATOCRIT: 30.5 % — AB (ref 35.0–47.0)
HEMOGLOBIN: 10.1 g/dL — AB (ref 12.0–16.0)
LYMPHS PCT: 16 %
Lymphs Abs: 0.7 10*3/uL — ABNORMAL LOW (ref 1.0–3.6)
MCH: 32.7 pg (ref 26.0–34.0)
MCHC: 33.1 g/dL (ref 32.0–36.0)
MCV: 98.8 fL (ref 80.0–100.0)
MONO ABS: 0.3 10*3/uL (ref 0.2–0.9)
Monocytes Relative: 7 %
Neutro Abs: 3.3 10*3/uL (ref 1.4–6.5)
Neutrophils Relative %: 75 %
PLATELETS: 303 10*3/uL (ref 150–440)
RBC: 3.09 MIL/uL — AB (ref 3.80–5.20)
RDW: 16.1 % — AB (ref 11.5–14.5)
WBC: 4.4 10*3/uL (ref 3.6–11.0)

## 2015-03-16 LAB — HEPATIC FUNCTION PANEL
ALBUMIN: 3.8 g/dL (ref 3.5–5.0)
ALT: 19 U/L (ref 14–54)
AST: 21 U/L (ref 15–41)
Alkaline Phosphatase: 63 U/L (ref 38–126)
BILIRUBIN INDIRECT: 0.5 mg/dL (ref 0.3–0.9)
Bilirubin, Direct: 0.1 mg/dL (ref 0.1–0.5)
TOTAL PROTEIN: 6.3 g/dL — AB (ref 6.5–8.1)
Total Bilirubin: 0.6 mg/dL (ref 0.3–1.2)

## 2015-03-16 LAB — CREATININE, SERUM
Creatinine, Ser: 0.55 mg/dL (ref 0.44–1.00)
GFR calc Af Amer: >60 mL/min (ref >60)

## 2015-03-16 MED ORDER — SODIUM CHLORIDE 0.9 % IV SOLN
Freq: Once | INTRAVENOUS | Status: AC
  Administered 2015-03-16: 13:00:00 via INTRAVENOUS
  Filled 2015-03-16: qty 4

## 2015-03-16 MED ORDER — SODIUM CHLORIDE 0.9 % IJ SOLN
10.0000 mL | INTRAMUSCULAR | Status: DC | PRN
  Administered 2015-03-16: 10 mL
  Filled 2015-03-16: qty 10

## 2015-03-16 MED ORDER — DIPHENHYDRAMINE HCL 50 MG/ML IJ SOLN
12.5000 mg | Freq: Once | INTRAMUSCULAR | Status: AC
  Administered 2015-03-16: 12.5 mg via INTRAVENOUS
  Filled 2015-03-16: qty 1

## 2015-03-16 MED ORDER — DEXTROSE 5 % IV SOLN
60.0000 mg/m2 | Freq: Once | INTRAVENOUS | Status: AC
  Administered 2015-03-16: 138 mg via INTRAVENOUS
  Filled 2015-03-16: qty 23

## 2015-03-16 MED ORDER — SODIUM CHLORIDE 0.9 % IV SOLN
Freq: Once | INTRAVENOUS | Status: AC
  Administered 2015-03-16: 12:00:00 via INTRAVENOUS
  Filled 2015-03-16: qty 250

## 2015-03-16 MED ORDER — HEPARIN SOD (PORK) LOCK FLUSH 100 UNIT/ML IV SOLN
500.0000 [IU] | Freq: Once | INTRAVENOUS | Status: AC | PRN
  Administered 2015-03-16: 500 [IU]
  Filled 2015-03-16: qty 5

## 2015-03-16 MED ORDER — FAMOTIDINE IN NACL 20-0.9 MG/50ML-% IV SOLN
20.0000 mg | Freq: Once | INTRAVENOUS | Status: AC
  Administered 2015-03-16: 20 mg via INTRAVENOUS
  Filled 2015-03-16: qty 50

## 2015-03-18 NOTE — Progress Notes (Signed)
Heidi [name]  [phone]) [phone] Fax:(336) [phone]     ID: Heidi [name] OB: 06-Mar-1941  MR#: [id number]  MAU#:[id number]  Patient Care Team: Debroah Baller, MD as PCP - General (Family Medicine)  CHIEF COMPLAINT / DIAGNOSIS:  Left breast Invasive Mammary Carcinoma with biopsy-proven left axillary lymph node metastasis. Ultrasound/mammogram on 07/27/14 and reported 10 x 10 x 9 mm mass in the left upper inner breast 9:00 position, multiple thickened left axillary lymph nodes largest measuring 8 x 6 mm. Ultrasound-guided core biopsy of the left breast mass with clip placement, and left axillary lymph node biopsy on 08/17/14 reported as consistent with invasive mammary carcinoma in both biopsies, ER positive (>90%), PR positive (>90%), HER-2/neu negative (was 2+ on IHC, FISH negative with Her2/CEP17 ratio of 1.18).  Clinically stage T1N1Mx (at least stage IIA)  -  patient on neoadjuvant chemotherapy. Completed AC x 4 (09/28/14 - 12/02/14). Started weekly Taxol on 12/23/14.  HISTORY OF PRESENT ILLNESS:  Patient returns for continued oncology follow-up and plan next dose of tAXOL chemotherapy. States that she is doing steady. Has persistent dyspnea on exertion but overall feels that it is improving. She wants to resume on chemotherapy. No fevers or chills. No nausea or vomiting. Denies any progressive breast masses on self-exam. Denies any worsening paresthesias in extremities.    REVIEW OF SYSTEMS:   ROS As in HPI above. In addition, no fever, chills or sweats. No new headaches or focal weakness.  No new mood disturbances. No  sore throat, cough, shortness of breath, sputum, hemoptysis or chest pain. No dizziness or palpitation. No abdominal pain, constipation, diarrhea, dysuria or hematuria. No new skin rash or bleeding symptoms.  PS ECOG 1-2.  PAST MEDICAL HISTORY: Past Medical History  Diagnosis Date  . Hypertension   . Depression   . Hyperlipidemia   . Osteoarthritis     . Insomnia   . Cataracts, bilateral   . UTI (lower urinary tract infection)   . Breast cancer     1990    PAST SURGICAL HISTORY: Past Surgical History  Procedure Laterality Date  . Mastectomy Right     lower 1990    FAMILY HISTORY Family History  Problem Relation Age of Onset  . Dementia Mother   . Bladder Cancer Father     ADVANCED DIRECTIVES:   SOCIAL HISTORY: History  Substance Use Topics  . Smoking status: Former Smoker -- 2.00 packs/day for 25 years    Types: Cigarettes  . Smokeless tobacco: Never Used     Comment: Pt quit 1995  . Alcohol Use: No    Allergies  Allergen Reactions  . Diphenhydramine Other (See Comments)    pt states makes legs very restless and ache  . Hydrochloric Acid     Other reaction(s): Unknown Uncoded Allergy. Allergen: HYDROCHLORLIC ACID.    Current Outpatient Prescriptions  Medication Sig Dispense Refill  . acyclovir (ZOVIRAX) 400 MG tablet Take 400 mg by mouth daily.    Marland Kitchen albuterol (PROVENTIL HFA;VENTOLIN HFA) 108 (90 BASE) MCG/ACT inhaler Inhale 2 puffs into the lungs every 4 (four) hours as needed for wheezing or shortness of breath. 1 Inhaler 2  . apixaban (ELIQUIS) 5 MG TABS tablet Take 1 tablet (5 mg total) by mouth 2 (two) times daily. 60 tablet 6  . diphenoxylate-atropine (LOMOTIL) 2.5-0.025 MG per tablet Take 1 tablet by mouth as needed.    . Fluticasone-Salmeterol (ADVAIR DISKUS) 250-50 MCG/DOSE AEPB Inhale 1 puff into the lungs 2 (two) times  daily. GARGLE AND RINSE AFTER EACH USE. 60 each 3  . furosemide (LASIX) 40 MG tablet Take 1 tablet (40 mg total) by mouth daily as needed. 30 tablet 3  . ibuprofen (ADVIL,MOTRIN) 200 MG tablet Take 200 mg by mouth every 6 (six) hours as needed.    Marland Kitchen losartan (COZAAR) 50 MG tablet Take 50 mg by mouth daily.     . metoprolol succinate (TOPROL-XL) 50 MG 24 hr tablet Take 1 tablet (50 mg total) by mouth daily. Take with or immediately following a meal. 30 tablet 3  . oxyCODONE (OXY  IR/ROXICODONE) 5 MG immediate release tablet Take 5 mg by mouth every 4 (four) hours as needed.    Marland Kitchen oxymetazoline (AFRIN) 0.05 % nasal spray Place into the nose.    . potassium chloride SA (K-DUR,KLOR-CON) 20 MEQ tablet Take 1 tablet (20 mEq total) by mouth daily as needed. (Patient not taking: Reported on 03/16/2015) 30 tablet 3  . sertraline (ZOLOFT) 100 MG tablet Take 100 mg by mouth daily.    Marland Kitchen zolpidem (AMBIEN) 10 MG tablet Take 10 mg by mouth at bedtime.    . potassium chloride (K-DUR,KLOR-CON) 10 MEQ tablet Take 10 mEq by mouth as needed.    Marland Kitchen POTASSIUM CHLORIDE PO Take by mouth.     No current facility-administered medications for this visit.   Facility-Administered Medications Ordered in Other Visits  Medication Dose Route Frequency Provider Last Rate Last Dose  . sodium chloride 0.9 % injection 10 mL  10 mL Intravenous PRN Leia Alf, MD      . sodium chloride 0.9 % injection 10 mL  10 mL Intracatheter PRN Leia Alf, MD   10 mL at 03/16/15 1110    OBJECTIVE: Filed Vitals:   03/02/15 1506  BP: 113/69  Pulse: 82  Temp: 97.6 F (36.4 C)     Body mass index is 35.67 kg/(m^2).    ECOG FS:1 - Symptomatic but completely ambulatory  GENERAL: Patient is alert and oriented and in no acute distress. There is no icterus. HEENT: EOMs intact. Oral exam negative for thrush or lesions. No cervical lymphadenopathy. CVS: S1S2, regular LUNGS: Bilaterally clear to auscultation, no rhonchi. ABDOMEN: Soft, nontender. No hepatomegaly.  NEURO: grossly nonfocal, cranial nerves are intact.  EXTREMITIES: No pedal edema.    LAB RESULTS: Cr 0.79, WBC 6000, ANC 4100, Hb 10.2, platelets 305K, LFT unremarkable, albumin 4.0.  Nov 2015 - serum CA 27.29 is 25.2              STUDIES: 02/19/15 - CT chest. IMPRESSION:  1. Relatively small pulmonary embolus noted within a segmental branch to the right upper lung lobe. No additional pulmonary embolus seen. 2. Mild patchy atelectasis or  scarring within both lower lobes. Minimal pneumonia cannot be excluded at the superior aspect of the right lower lobe. 3. Multiple thoracic vertebral body hemangiomas incidentally noted.    ASSESSMENT / PLAN:   1. Left breast Invasive Mammary Carcinoma with biopsy-proven left axillary lymph node metastasis. Ultrasound/mammogram on 07/27/14 and reported 10 x 10 x 9 mm mass in the left upper inner breast 9:00 position, multiple thickened left axillary lymph nodes largest measuring 8 x 6 mm. Ultrasound-guided core biopsy of the left breast mass with clip placement, and left axillary lymph node biopsy on 08/17/14 reported as consistent with invasive mammary carcinoma in both biopsies, ER positive (>90%), PR positive (>90%), HER-2/neu negative (was 2+ on IHC, FISH negative with Her2/CEP17 ratio of 1.18).  Clinically stage T1N1M0 (at  least stage IIA). Repeat mammogram/ultrasound of the left breast on February 16 indicated positive response to neoadjuvant chemotherapy with decrease in size of the mass which is now 7.5 x 6.6 x 5.6 mm, no enlarged axillary lymph nodes seen and also have decreased in size from the prior study - reviewed labs and d/w patient. Overall feeling better and wants to resume on chemotherapy. Treatment was held last week due to symptoms. Also, leg swelling is better. Venous Doppler on April 6 bilateral lower extremities negative for DVT. Patient wants to resume on chemotherapy. Blood counts are adequate. We will proceed next dose Taxol at same dosage of 60 mg/m2 IV. We will get CBC and differential at 1 week and pursue next dose of Taxol if labs are adequate. Next MD followup at 2 weeks with CBC, creatinine, LFT, and plan continued treatment.  2. Anemia likely secondary to chemotherapy - mild, no new symptoms. Continue to monitor.  (Continued in [place]. Mucositis, thrush - secondary to chemotherapy, now resolved.  4. Recent neutropenia - secondary to last dose of chemotherapy. Currently has  improved. Continue to monitor. 5. Dyspnea on exertion, small PE diagnosed 02/19/15 - Patient is seeing Cardiology and Pulmonary. On anticoagulation with Eliquis.  In between visits, patient advised to call or come to ER in case of any worsening symptoms or acute sickness. She is agreeable to this plan.   Leia Alf, MD   03/18/2015 9:22 AM

## 2015-03-18 NOTE — Progress Notes (Deleted)
[facility] [place]  Telephone:([phone] [phone]) [phone]     ID: [name] OB: 10-26-1941  MR#: [id number]  IRS#:[id number]  Patient Care Team: Debroah Baller, MD as PCP - General (Family Medicine)  CHIEF COMPLAINT:  Chief Complaint  Patient presents with  . Follow-up    Breast Cancer  . Chemotherapy    Taxol treatment    INTERVAL HISTORY: ***  REVIEW OF SYSTEMS:   ROS  As per HPI. Otherwise, a complete review of systems is negatve.  PAST MEDICAL HISTORY: Past Medical History  Diagnosis Date  . Hypertension   . Depression   . Hyperlipidemia   . Osteoarthritis   . Insomnia   . Cataracts, bilateral   . UTI (lower urinary tract infection)   . Breast cancer     1990    PAST SURGICAL HISTORY: Past Surgical History  Procedure Laterality Date  . Mastectomy Right     lower 1990    FAMILY HISTORY Family History  Problem Relation Age of Onset  . Dementia Mother   . Bladder Cancer Father     GYNECOLOGIC HISTORY:  No LMP recorded.      ADVANCED DIRECTIVES:    HEALTH MAINTENANCE: History  Substance Use Topics  . Smoking status: Former Smoker -- 2.00 packs/day for 25 years    Types: Cigarettes  . Smokeless tobacco: Never Used     Comment: Pt quit 1995  . Alcohol Use: No     Colonoscopy:  PAP:  Bone density:  Lipid panel:  Allergies  Allergen Reactions  . Diphenhydramine Other (See Comments)    pt states makes legs very restless and ache  . Hydrochloric Acid     Other reaction(s): Unknown Uncoded Allergy. Allergen: HYDROCHLORLIC ACID.    Current Outpatient Prescriptions  Medication Sig Dispense Refill  . acyclovir (ZOVIRAX) 400 MG tablet Take 400 mg by mouth daily.    Marland Kitchen albuterol (PROVENTIL HFA;VENTOLIN HFA) 108 (90 BASE) MCG/ACT inhaler Inhale 2 puffs into the lungs every 4 (four) hours as needed for wheezing or shortness of breath. 1 Inhaler 2  . apixaban (ELIQUIS) 5 MG TABS tablet Take 1 tablet (5 mg total) by mouth 2 (two)  times daily. 60 tablet 6  . diphenoxylate-atropine (LOMOTIL) 2.5-0.025 MG per tablet Take 1 tablet by mouth as needed.    . Fluticasone-Salmeterol (ADVAIR DISKUS) 250-50 MCG/DOSE AEPB Inhale 1 puff into the lungs 2 (two) times daily. GARGLE AND RINSE AFTER EACH USE. 60 each 3  . furosemide (LASIX) 40 MG tablet Take 1 tablet (40 mg total) by mouth daily as needed. 30 tablet 3  . ibuprofen (ADVIL,MOTRIN) 200 MG tablet Take 200 mg by mouth every 6 (six) hours as needed.    Marland Kitchen losartan (COZAAR) 50 MG tablet Take 50 mg by mouth daily.     . metoprolol succinate (TOPROL-XL) 50 MG 24 hr tablet Take 1 tablet (50 mg total) by mouth daily. Take with or immediately following a meal. 30 tablet 3  . oxyCODONE (OXY IR/ROXICODONE) 5 MG immediate release tablet Take 5 mg by mouth every 4 (four) hours as needed.    Marland Kitchen oxymetazoline (AFRIN) 0.05 % nasal spray Place into the nose.    . potassium chloride SA (K-DUR,KLOR-CON) 20 MEQ tablet Take 1 tablet (20 mEq total) by mouth daily as needed. (Patient not taking: Reported on 03/16/2015) 30 tablet 3  . sertraline (ZOLOFT) 100 MG tablet Take 100 mg by mouth daily.    Marland Kitchen zolpidem (AMBIEN) 10 MG  tablet Take 10 mg by mouth at bedtime.    . potassium chloride (K-DUR,KLOR-CON) 10 MEQ tablet Take 10 mEq by mouth as needed.    Marland Kitchen POTASSIUM CHLORIDE PO Take by mouth.     No current facility-administered medications for this visit.   Facility-Administered Medications Ordered in Other Visits  Medication Dose Route Frequency Provider Last Rate Last Dose  . sodium chloride 0.9 % injection 10 mL  10 mL Intravenous PRN Leia Alf, MD      . sodium chloride 0.9 % injection 10 mL  10 mL Intracatheter PRN Leia Alf, MD   10 mL at 03/16/15 1110    OBJECTIVE: Filed Vitals:   03/02/15 1506  BP: 113/69  Pulse: 82  Temp: 97.6 F (36.4 C)     Body mass index is 35.67 kg/(m^2).    ECOG FS:{CHL ONC [id number]  General: Well-developed, well-nourished, no acute  distress. Eyes: Pink conjunctiva, anicteric sclera. HEENT: Normocephalic, moist mucous membranes, clear oropharnyx. Lungs: Clear to auscultation bilaterally. Heart: Regular rate and rhythm. No rubs, murmurs, or gallops. Abdomen: Soft, nontender, nondistended. No organomegaly noted, normoactive bowel sounds. Musculoskeletal: No edema, cyanosis, or clubbing. Neuro: Alert, answering all questions appropriately. Cranial nerves grossly intact. Skin: No rashes or petechiae noted. Psych: Normal affect. Lymphatics: No cervical, calvicular, axillary or inguinal LAD.   LAB RESULTS:     Component Value Date/Time   NA 136 02/19/2015 2136   K 4.0 02/19/2015 2136   CL 104 02/19/2015 2136   CO2 25 02/19/2015 2136   GLUCOSE 124* 02/19/2015 2136   BUN 11 02/19/2015 2136   CREATININE 0.55 03/16/2015 1137   CREATININE 0.46 02/19/2015 2136   CALCIUM 8.5* 02/19/2015 2136   PROT 6.3* 03/16/2015 1137   PROT 6.3* 02/19/2015 2136   ALBUMIN 3.8 03/16/2015 1137   ALBUMIN 3.8 02/19/2015 2136   AST 21 03/16/2015 1137   AST 21 02/19/2015 2136   ALT 19 03/16/2015 1137   ALT 19 02/19/2015 2136   ALKPHOS 63 03/16/2015 1137   ALKPHOS 78 02/19/2015 2136   BILITOT 0.6 03/16/2015 1137   GFRNONAA >60 03/16/2015 1137   GFRNONAA >60 02/19/2015 2136   GFRAA >60 03/16/2015 1137   GFRAA >60 02/19/2015 2136    No results found for: SPEP, UPEP  Lab Results  Component Value Date   WBC 4.4 03/16/2015   NEUTROABS 3.3 03/16/2015   HGB 10.1* 03/16/2015   HCT 30.5* 03/16/2015   MCV 98.8 03/16/2015   PLT 303 03/16/2015      Chemistry      Component Value Date/Time   NA 136 02/19/2015 2136   K 4.0 02/19/2015 2136   CL 104 02/19/2015 2136   CO2 25 02/19/2015 2136   BUN 11 02/19/2015 2136   CREATININE 0.55 03/16/2015 1137   CREATININE 0.46 02/19/2015 2136      Component Value Date/Time   CALCIUM 8.5* 02/19/2015 2136   ALKPHOS 63 03/16/2015 1137   ALKPHOS 78 02/19/2015 2136   AST 21 03/16/2015 1137    AST 21 02/19/2015 2136   ALT 19 03/16/2015 1137   ALT 19 02/19/2015 2136   BILITOT 0.6 03/16/2015 1137       Lab Results  Component Value Date   LABCA2 25.2 09/09/2014    No components found for: [id number]  No results for input(s): INR in the last 168 hours.  No results found for: COLORURINE, APPEARANCEUR, LABSPEC, PHURINE, GLUCOSEU, HGBUR, BILIRUBINUR, KETONESUR, PROTEINUR, UROBILINOGEN, NITRITE, LEUKOCYTESUR  STUDIES: Dg Chest 2 View  02/19/2015   CLINICAL DATA:  Dyspnea  EXAM: CHEST  2 VIEW  COMPARISON:  September 21, 2014  FINDINGS: The heart size and mediastinal contours are stable. Right central venous line is unchanged. The aorta is tortuous. There is no focal infiltrate, pulmonary edema, or pleural effusion. The visualized skeletal structures are stable.  IMPRESSION: No active cardiopulmonary disease.   Electronically Signed   By: Abelardo Diesel M.D.   On: 02/19/2015 20:20   Ct Angio Chest Pe W/cm &/or Wo Cm  02/19/2015   CLINICAL DATA:  Acute onset of dyspnea. Shortness of breath. Known bilateral breast cancer, status post right-sided mastectomy and chemotherapy. Initial encounter.  EXAM: CT ANGIOGRAPHY CHEST WITH CONTRAST  TECHNIQUE: Multidetector CT imaging of the chest was performed using the standard protocol during bolus administration of intravenous contrast. Multiplanar CT image reconstructions and MIPs were obtained to evaluate the vascular anatomy.  CONTRAST:  54.6 mL of Omnipaque 350 IV contrast  COMPARISON:  CT of the chest performed 02/11/2015, and chest radiograph performed earlier today at 7:28 p.m.  FINDINGS: There is a relatively small pulmonary embolus within a segmental branch to the right upper lobe. No additional pulmonary embolus is seen.  Mild patchy atelectasis or scarring is noted within both lower lobes. Minimal infection cannot be excluded at the superior aspect of the right lower lobe. There is no evidence of pleural effusion or pneumothorax. No masses are  identified; no abnormal focal contrast enhancement is seen.  The mediastinum is unremarkable in appearance. No mediastinal lymphadenopathy is seen. No pericardial effusion is identified. Scattered calcification is noted along the aortic arch and left subclavian artery. A right-sided chest port is noted.  The patient is status post right-sided mastectomy. No axillary lymphadenopathy is seen. The visualized portions of the thyroid gland are unremarkable in appearance.  The visualized portions of the liver and spleen are unremarkable. The visualized portions of the pancreas and adrenal glands are within normal limits.  No acute osseous abnormalities are seen. Vertebral body hemangiomas are noted at T3 and T11, and to a lesser extent at additional thoracic vertebral bodies.  Review of the MIP images confirms the above findings.  IMPRESSION: 1. Relatively small pulmonary embolus noted within a segmental branch to the right upper lung lobe. No additional pulmonary embolus seen. 2. Mild patchy atelectasis or scarring within both lower lobes. Minimal pneumonia cannot be excluded at the superior aspect of the right lower lobe. 3. Multiple thoracic vertebral body hemangiomas incidentally noted.  Critical Value/emergent results were called by telephone at the time of interpretation on 02/19/2015 at 11:44 pm to Dr. Delman Kitten, who verbally acknowledged these results.   Electronically Signed   By: Garald Balding M.D.   On: 02/19/2015 23:45    ASSESSMENT:   PLAN:  No problem-specific assessment & plan notes found for this encounter.   Patient expressed understanding and was in agreement with this plan. She also understands that She can call clinic at any time with any questions, concerns, or complaints.   No matching staging information was found for the patient.  Leia Alf, MD   03/18/2015 9:09 AM

## 2015-03-19 ENCOUNTER — Telehealth: Payer: Self-pay | Admitting: *Deleted

## 2015-03-19 NOTE — Telephone Encounter (Signed)
Got appt for pt to go see Dr. Pat Patrick in the [place] office on June 9 at 2:45.  Josh left message for pt about appt date and time.  I faxed over info on pt.

## 2015-03-22 ENCOUNTER — Telehealth: Payer: Self-pay | Admitting: *Deleted

## 2015-03-22 MED ORDER — OXYCODONE HCL 5 MG PO TABS: ORAL_TABLET | ORAL | Status: DC

## 2015-03-22 NOTE — Telephone Encounter (Signed)
Informed that prescription is ready to pick up  

## 2015-03-23 ENCOUNTER — Ambulatory Visit: Payer: Medicare Other | Admitting: Cardiovascular Disease

## 2015-03-23 ENCOUNTER — Inpatient Hospital Stay: Payer: Medicare Other

## 2015-03-23 VITALS — BP 119/77 | HR 84 | Temp 97.1°F | Resp 18

## 2015-03-23 DIAGNOSIS — C50912 Malignant neoplasm of unspecified site of left female breast: Secondary | ICD-10-CM

## 2015-03-23 DIAGNOSIS — C50212 Malignant neoplasm of upper-inner quadrant of left female breast: Secondary | ICD-10-CM | POA: Diagnosis not present

## 2015-03-23 LAB — CBC WITH DIFFERENTIAL/PLATELET
Basophils Absolute: 0 10*3/uL (ref 0–0.1)
Basophils Relative: 1 %
EOS PCT: 2 %
Eosinophils Absolute: 0.1 10*3/uL (ref 0–0.7)
HEMATOCRIT: 31.2 % — AB (ref 35.0–47.0)
Hemoglobin: 10.1 g/dL — ABNORMAL LOW (ref 12.0–16.0)
LYMPHS ABS: 0.8 10*3/uL — AB (ref 1.0–3.6)
Lymphocytes Relative: 22 %
MCH: 32.1 pg (ref 26.0–34.0)
MCHC: 32.4 g/dL (ref 32.0–36.0)
MCV: 99.2 fL (ref 80.0–100.0)
MONO ABS: 0.4 10*3/uL (ref 0.2–0.9)
MONOS PCT: 11 %
NEUTROS ABS: 2.1 10*3/uL (ref 1.4–6.5)
NEUTROS PCT: 64 %
Platelets: 302 10*3/uL (ref 150–440)
RBC: 3.14 MIL/uL — ABNORMAL LOW (ref 3.80–5.20)
RDW: 16.2 % — ABNORMAL HIGH (ref 11.5–14.5)
WBC: 3.4 10*3/uL — ABNORMAL LOW (ref 3.6–11.0)

## 2015-03-23 MED ORDER — FAMOTIDINE IN NACL 20-0.9 MG/50ML-% IV SOLN
20.0000 mg | Freq: Once | INTRAVENOUS | Status: AC
  Administered 2015-03-23: 20 mg via INTRAVENOUS
  Filled 2015-03-23: qty 50

## 2015-03-23 MED ORDER — DIPHENHYDRAMINE HCL 50 MG/ML IJ SOLN
12.5000 mg | Freq: Once | INTRAMUSCULAR | Status: AC
  Administered 2015-03-23: 12.5 mg via INTRAVENOUS
  Filled 2015-03-23: qty 1

## 2015-03-23 MED ORDER — SODIUM CHLORIDE 0.9 % IJ SOLN
10.0000 mL | INTRAMUSCULAR | Status: DC | PRN
  Administered 2015-03-23: 10 mL
  Filled 2015-03-23: qty 10

## 2015-03-23 MED ORDER — PACLITAXEL CHEMO INJECTION 300 MG/50ML
60.0000 mg/m2 | Freq: Once | INTRAVENOUS | Status: AC
  Administered 2015-03-23: 138 mg via INTRAVENOUS
  Filled 2015-03-23: qty 23

## 2015-03-23 MED ORDER — SODIUM CHLORIDE 0.9 % IV SOLN
Freq: Once | INTRAVENOUS | Status: AC
  Administered 2015-03-23: 15:00:00 via INTRAVENOUS
  Filled 2015-03-23: qty 250

## 2015-03-23 MED ORDER — SODIUM CHLORIDE 0.9 % IV SOLN
Freq: Once | INTRAVENOUS | Status: AC
  Administered 2015-03-23: 16:00:00 via INTRAVENOUS
  Filled 2015-03-23: qty 4

## 2015-03-25 NOTE — Progress Notes (Signed)
[place]  Telephone:(336) [phone] Fax:(336) [phone]     ID: Heidi [name] OB: Mar 01, 1941  MR#: [id number]  ZHY#:[id number]  Patient Care Team: Debroah Baller, MD as PCP - General (Family Medicine)  CHIEF COMPLAINT / DIAGNOSIS:  Left breast Invasive Mammary Carcinoma with biopsy-proven left axillary lymph node metastasis. Ultrasound/mammogram on 07/27/14 and reported 10 x 10 x 9 mm mass in the left upper inner breast 9:00 position, multiple thickened left axillary lymph nodes largest measuring 8 x 6 mm. Ultrasound-guided core biopsy of the left breast mass with clip placement, and left axillary lymph node biopsy on 08/17/14 reported as consistent with invasive mammary carcinoma in both biopsies, ER positive (>90%), PR positive (>90%), HER-2/neu negative (was 2+ on IHC, FISH negative with Her2/CEP17 ratio of 1.18).  Clinically stage T1N1Mx (at least stage IIA)  -  patient on neoadjuvant chemotherapy. Completed AC x 4 (09/28/14 - 12/02/14). Started weekly Taxol on 12/23/14.  HISTORY OF PRESENT ILLNESS:   Patient returns for continued oncology evaluation follow-up and plan next dose of Taxol chemotherapy. States that she is doing fairly steady. Has persistent dyspnea on exertion but overall feels that it is improving. She wants to resume on chemotherapy. No fevers or chills. No nausea or vomiting. Denies any progressive breast masses on self-exam. Denies any worsening paresthesias in extremities. No falls or LOC.  REVIEW OF SYSTEMS:   ROS  As in HPI above. In addition, no fever, chills or sweats. No new headaches or focal weakness.  No new mood disturbances. No  sore throat, cough, shortness of breath, sputum, hemoptysis or chest pain. No dizziness or palpitation. No abdominal pain, constipation, diarrhea, dysuria or hematuria. No new skin rash or bleeding symptoms.  PS ECOG 1-2.  PAST MEDICAL HISTORY: Past Medical History  Diagnosis Date  . Hypertension   . Depression   .  Hyperlipidemia   . Osteoarthritis   . Insomnia   . Cataracts, bilateral   . UTI (lower urinary tract infection)   . Breast cancer     1990    PAST SURGICAL HISTORY: Past Surgical History  Procedure Laterality Date  . Mastectomy Right     lower 1990    FAMILY HISTORY Family History  Problem Relation Age of Onset  . Dementia Mother   . Bladder Cancer Father     ADVANCED DIRECTIVES:   SOCIAL HISTORY: History  Substance Use Topics  . Smoking status: Former Smoker -- 2.00 packs/day for 25 years    Types: Cigarettes  . Smokeless tobacco: Never Used     Comment: Pt quit 1995  . Alcohol Use: No    Allergies  Allergen Reactions  . Diphenhydramine Other (See Comments)    pt states makes legs very restless and ache  . Hydrochloric Acid     Other reaction(s): Unknown Uncoded Allergy. Allergen: HYDROCHLORLIC ACID.    Current Outpatient Prescriptions  Medication Sig Dispense Refill  . albuterol (PROVENTIL HFA;VENTOLIN HFA) 108 (90 BASE) MCG/ACT inhaler Inhale 2 puffs into the lungs every 4 (four) hours as needed for wheezing or shortness of breath. 1 Inhaler 2  . apixaban (ELIQUIS) 5 MG TABS tablet Take 1 tablet (5 mg total) by mouth 2 (two) times daily. 60 tablet 6  . diphenoxylate-atropine (LOMOTIL) 2.5-0.025 MG per tablet Take 1 tablet by mouth as needed.    . Fluticasone-Salmeterol (ADVAIR DISKUS) 250-50 MCG/DOSE AEPB Inhale 1 puff into the lungs 2 (two) times daily. GARGLE AND RINSE AFTER EACH USE. 60 each 3  .  furosemide (LASIX) 40 MG tablet Take 1 tablet (40 mg total) by mouth daily as needed. 30 tablet 3  . ibuprofen (ADVIL,MOTRIN) 200 MG tablet Take 200 mg by mouth every 6 (six) hours as needed.    Marland Kitchen losartan (COZAAR) 50 MG tablet Take 50 mg by mouth daily.     . metoprolol succinate (TOPROL-XL) 50 MG 24 hr tablet Take 1 tablet (50 mg total) by mouth daily. Take with or immediately following a meal. 30 tablet 3  . oxymetazoline (AFRIN) 0.05 % nasal spray Place into  the nose.    . potassium chloride (K-DUR,KLOR-CON) 10 MEQ tablet Take 10 mEq by mouth as needed.    . sertraline (ZOLOFT) 100 MG tablet Take 100 mg by mouth daily.    Marland Kitchen zolpidem (AMBIEN) 10 MG tablet Take 10 mg by mouth at bedtime.    Marland Kitchen acyclovir (ZOVIRAX) 400 MG tablet Take 400 mg by mouth daily.    Marland Kitchen oxyCODONE (OXY IR/ROXICODONE) 5 MG immediate release tablet 1-2 tabs every 4 hours as needed 60 tablet 0  . POTASSIUM CHLORIDE PO Take by mouth.    . potassium chloride SA (K-DUR,KLOR-CON) 20 MEQ tablet Take 1 tablet (20 mEq total) by mouth daily as needed. (Patient not taking: Reported on 03/16/2015) 30 tablet 3   No current facility-administered medications for this visit.   Facility-Administered Medications Ordered in Other Visits  Medication Dose Route Frequency Provider Last Rate Last Dose  . sodium chloride 0.9 % injection 10 mL  10 mL Intravenous PRN Leia Alf, MD      . sodium chloride 0.9 % injection 10 mL  10 mL Intracatheter PRN Leia Alf, MD   10 mL at 03/16/15 1110    OBJECTIVE: Filed Vitals:   03/16/15 1137  BP: 120/86  Pulse: 92  Temp: 98.2 F (36.8 C)     Body mass index is 34.57 kg/(m^2).    ECOG FS:1 - Symptomatic but completely ambulatory  GENERAL: Patient is alert and oriented and in no acute distress. There is no icterus. HEENT: EOMs intact. Oral exam negative for thrush or lesions. No cervical lymphadenopathy. CVS: S1S2, regular LUNGS: Bilaterally clear to auscultation, no rhonchi. ABDOMEN: Soft, nontender. No hepatomegaly.  NEURO: grossly nonfocal, cranial nerves are intact.  EXTREMITIES: No pedal edema. BREASTS: no obvious mass or axillary adenopathy on left side. Exam performed in presence of a nurse.  LAB RESULTS: Cr 0.55, WBC 4400, ANC 3300, Hb 10.1, platelets 303K.  Nov 2015 - serum CA 27.29 is 25.2        STUDIES: 02/19/15 - CT chest. IMPRESSION:  1. Relatively small pulmonary embolus noted within a segmental branch to the right upper lung  lobe. No additional pulmonary embolus seen. 2. Mild patchy atelectasis or scarring within both lower lobes. Minimal pneumonia cannot be excluded at the superior aspect of the right lower lobe. 3. Multiple thoracic vertebral body hemangiomas incidentally noted.  ASSESSMENT / PLAN:   1. Left breast Invasive Mammary Carcinoma with biopsy-proven left axillary lymph node metastasis. Ultrasound/mammogram on 07/27/14 and reported 10 x 10 x 9 mm mass in the left upper inner breast 9:00 position, multiple thickened left axillary lymph nodes largest measuring 8 x 6 mm. Ultrasound-guided core biopsy of the left breast mass with clip placement, and left axillary lymph node biopsy on 08/17/14 reported as consistent with invasive mammary carcinoma in both biopsies, ER positive (>90%), PR positive (>90%), HER-2/neu negative (was 2+ on IHC, FISH negative with Her2/CEP17 ratio of 1.18).  Clinically  stage T1N1M0 (at least stage IIA). Repeat mammogram/ultrasound of the left breast on February 16 indicated positive response to neoadjuvant chemotherapy with decrease in size of the mass which is now 7.5 x 6.6 x 5.6 mm, no enlarged axillary lymph nodes seen and also have decreased in size from the prior study - reviewed labs and d/w patient. Overall feeling better and wants to resume on chemotherapy. Treatment was held last week due to symptoms. Also, leg swelling is better. Venous Doppler on April 6 bilateral lower extremities negative for DVT. Patient wants to resume on chemotherapy. Blood counts are adequate. Will proceed with week 11 Taxol at same dosage of 60 mg/m2 IV. Lab in 1 week and last planned dose of week 12 . Lab at 2 weeks and at 3 weeks. Will request appt with Dr.Ely (patient known to him) in about 2 weeks to plan breast surgery. Will see her back at 7 weeks and get Port flush.  2. Anemia likely secondary to chemotherapy - mild, no new symptoms. Continue to monitor.  3. Mucositis, thrush - secondary to chemotherapy,  now resolved.  4. Recent neutropenia - secondary to last dose of chemotherapy. Currently has improved. Continue to monitor. 5. Dyspnea on exertion, small PE diagnosed 02/19/15 - Patient is seeing Cardiology and Pulmonary. On anticoagulation with Eliquis. In between visits, patient advised to call or come to ER in case of any worsening symptoms or acute sickness. She is agreeable to this plan.   Leia Alf, MD   03/25/2015 12:13 PM

## 2015-03-30 ENCOUNTER — Inpatient Hospital Stay (HOSPITAL_BASED_OUTPATIENT_CLINIC_OR_DEPARTMENT_OTHER): Payer: Medicare Other | Admitting: Internal Medicine

## 2015-03-30 ENCOUNTER — Telehealth: Payer: Self-pay | Admitting: *Deleted

## 2015-03-30 ENCOUNTER — Inpatient Hospital Stay: Payer: Medicare Other

## 2015-03-30 ENCOUNTER — Ambulatory Visit: Payer: Self-pay | Admitting: Internal Medicine

## 2015-03-30 VITALS — BP 106/73 | HR 88 | Temp 98.2°F | Ht 68.0 in | Wt 227.0 lb

## 2015-03-30 DIAGNOSIS — L03019 Cellulitis of unspecified finger: Secondary | ICD-10-CM

## 2015-03-30 DIAGNOSIS — C773 Secondary and unspecified malignant neoplasm of axilla and upper limb lymph nodes: Secondary | ICD-10-CM

## 2015-03-30 DIAGNOSIS — C50212 Malignant neoplasm of upper-inner quadrant of left female breast: Secondary | ICD-10-CM

## 2015-03-30 DIAGNOSIS — L03119 Cellulitis of unspecified part of limb: Secondary | ICD-10-CM

## 2015-03-30 DIAGNOSIS — Z87891 Personal history of nicotine dependence: Secondary | ICD-10-CM

## 2015-03-30 DIAGNOSIS — C50912 Malignant neoplasm of unspecified site of left female breast: Secondary | ICD-10-CM

## 2015-03-30 DIAGNOSIS — Z853 Personal history of malignant neoplasm of breast: Secondary | ICD-10-CM

## 2015-03-30 DIAGNOSIS — Z170 Estrogen receptor positive status [ER+]: Secondary | ICD-10-CM | POA: Diagnosis not present

## 2015-03-30 DIAGNOSIS — E785 Hyperlipidemia, unspecified: Secondary | ICD-10-CM

## 2015-03-30 DIAGNOSIS — Z79899 Other long term (current) drug therapy: Secondary | ICD-10-CM

## 2015-03-30 DIAGNOSIS — Z9011 Acquired absence of right breast and nipple: Secondary | ICD-10-CM

## 2015-03-30 DIAGNOSIS — M1990 Unspecified osteoarthritis, unspecified site: Secondary | ICD-10-CM

## 2015-03-30 DIAGNOSIS — I10 Essential (primary) hypertension: Secondary | ICD-10-CM

## 2015-03-30 LAB — CBC WITH DIFFERENTIAL/PLATELET
BASOS ABS: 0 10*3/uL (ref 0–0.1)
Basophils Relative: 1 %
Eosinophils Absolute: 0.1 10*3/uL (ref 0–0.7)
Eosinophils Relative: 2 %
HEMATOCRIT: 29.7 % — AB (ref 35.0–47.0)
HEMOGLOBIN: 9.7 g/dL — AB (ref 12.0–16.0)
Lymphocytes Relative: 15 %
Lymphs Abs: 0.6 10*3/uL — ABNORMAL LOW (ref 1.0–3.6)
MCH: 32 pg (ref 26.0–34.0)
MCHC: 32.6 g/dL (ref 32.0–36.0)
MCV: 98.1 fL (ref 80.0–100.0)
MONO ABS: 0.3 10*3/uL (ref 0.2–0.9)
MONOS PCT: 7 %
NEUTROS ABS: 3 10*3/uL (ref 1.4–6.5)
Neutrophils Relative %: 75 %
Platelets: 268 10*3/uL (ref 150–440)
RBC: 3.03 MIL/uL — ABNORMAL LOW (ref 3.80–5.20)
RDW: 17 % — ABNORMAL HIGH (ref 11.5–14.5)
WBC: 4 10*3/uL (ref 3.6–11.0)

## 2015-03-30 MED ORDER — CLINDAMYCIN HCL 300 MG PO CAPS: 300.0000 mg | ORAL_CAPSULE | Freq: Four times a day (QID) | ORAL | Status: DC

## 2015-03-30 NOTE — Progress Notes (Signed)
Patient received her last Taxol treatment last Tuesday. She states that on Wednesday she noticed that some of her fingers and toes looked swollen and discolored. She also states that they are warm to the touch and painful. She states that she spoke to [place] on Thursday and she thought it might be a side effect of the Taxol, but it has since gotten much worse.

## 2015-03-30 NOTE — Telephone Encounter (Signed)
Finger infected from "neuropathy. It is swollen, discolored and very painful. Has a lab appt today and states she needs to see Dr Ma Hillock or go to the ER. Per Dr Ma Hillock, add patient on to see him with her lab appt . Appt for 1030 given and agreed on

## 2015-03-31 ENCOUNTER — Telehealth: Payer: Self-pay | Admitting: *Deleted

## 2015-03-31 NOTE — Telephone Encounter (Signed)
FYI

## 2015-04-02 ENCOUNTER — Other Ambulatory Visit: Payer: Self-pay | Admitting: *Deleted

## 2015-04-02 MED ORDER — ZOLPIDEM TARTRATE 10 MG PO TABS: 10.0000 mg | ORAL_TABLET | Freq: Every day | ORAL | Status: DC

## 2015-04-02 NOTE — Progress Notes (Signed)
Heidi [name]  Telephone:(336) [phone] Fax:(336) [phone]     ID: Heidi [name] OB: August 18, 1941  MR#: [id number]  JYN#:[id number]  Patient Care Team: Debroah Baller, MD as PCP - General (Family Medicine)  CHIEF COMPLAINT/DIAGNOSIS:    HISTORY OF PRESENT ILLNESS:   REVIEW OF SYSTEMS:   ROS As in HPI above. In addition, no fever, chills or sweats. No new headaches or focal weakness.  No new mood disturbances. No  sore throat, cough, shortness of breath, sputum, hemoptysis or chest pain. No dizziness or palpitation. No abdominal pain, constipation, diarrhea, dysuria or hematuria. No new skin rash or bleeding symptoms. No new paresthesias in extremities.  Otherwise, a complete review of systems is negative.  PAST MEDICAL HISTORY: Past Medical History  Diagnosis Date  . Hypertension   . Depression   . Hyperlipidemia   . Osteoarthritis   . Insomnia   . Cataracts, bilateral   . UTI (lower urinary tract infection)   . Breast cancer     1990    PAST SURGICAL HISTORY: Past Surgical History  Procedure Laterality Date  . Mastectomy Right     lower 1990    FAMILY HISTORY: Family History  Problem Relation Age of Onset  . Dementia Mother   . Bladder Cancer Father     ADVANCED DIRECTIVES:  <no information>  SOCIAL HISTORY: History  Substance Use Topics  . Smoking status: Former Smoker -- 2.00 packs/day for 25 years    Types: Cigarettes  . Smokeless tobacco: Never Used     Comment: Pt quit 1995  . Alcohol Use: No    Allergies  Allergen Reactions  . Diphenhydramine Other (See Comments)    pt states makes legs very restless and ache  . Hydrochloric Acid     Other reaction(s): Unknown Uncoded Allergy. Allergen: HYDROCHLORLIC ACID.    Current Outpatient Prescriptions  Medication Sig Dispense Refill  . acyclovir (ZOVIRAX) 400 MG tablet Take 400 mg by mouth daily.    Marland Kitchen albuterol (PROVENTIL HFA;VENTOLIN HFA) 108 (90 BASE) MCG/ACT inhaler Inhale 2 puffs into  the lungs every 4 (four) hours as needed for wheezing or shortness of breath. 1 Inhaler 2  . apixaban (ELIQUIS) 5 MG TABS tablet Take 1 tablet (5 mg total) by mouth 2 (two) times daily. 60 tablet 6  . clindamycin (CLEOCIN) 300 MG capsule Take 1 capsule (300 mg total) by mouth 4 (four) times daily. 28 capsule 0  . diphenoxylate-atropine (LOMOTIL) 2.5-0.025 MG per tablet Take 1 tablet by mouth as needed.    . Fluticasone-Salmeterol (ADVAIR DISKUS) 250-50 MCG/DOSE AEPB Inhale 1 puff into the lungs 2 (two) times daily. GARGLE AND RINSE AFTER EACH USE. 60 each 3  . furosemide (LASIX) 40 MG tablet Take 1 tablet (40 mg total) by mouth daily as needed. 30 tablet 3  . ibuprofen (ADVIL,MOTRIN) 200 MG tablet Take 200 mg by mouth every 6 (six) hours as needed.    Marland Kitchen losartan (COZAAR) 50 MG tablet Take 50 mg by mouth daily.     . metoprolol succinate (TOPROL-XL) 50 MG 24 hr tablet Take 1 tablet (50 mg total) by mouth daily. Take with or immediately following a meal. 30 tablet 3  . oxyCODONE (OXY IR/ROXICODONE) 5 MG immediate release tablet 1-2 tabs every 4 hours as needed 60 tablet 0  . oxymetazoline (AFRIN) 0.05 % nasal spray Place into the nose.    . potassium chloride (K-DUR,KLOR-CON) 10 MEQ tablet Take 10 mEq by mouth as needed.    Marland Kitchen  POTASSIUM CHLORIDE PO Take by mouth.    . potassium chloride SA (K-DUR,KLOR-CON) 20 MEQ tablet Take 1 tablet (20 mEq total) by mouth daily as needed. (Patient not taking: Reported on 03/16/2015) 30 tablet 3  . sertraline (ZOLOFT) 100 MG tablet Take 100 mg by mouth daily.    Marland Kitchen zolpidem (AMBIEN) 10 MG tablet Take 1 tablet (10 mg total) by mouth at bedtime. 30 tablet 1   No current facility-administered medications for this visit.   Facility-Administered Medications Ordered in Other Visits  Medication Dose Route Frequency Provider Last Rate Last Dose  . sodium chloride 0.9 % injection 10 mL  10 mL Intravenous PRN Leia Alf, MD      . sodium chloride 0.9 % injection 10 mL   10 mL Intracatheter PRN Leia Alf, MD   10 mL at 03/16/15 1110    OBJECTIVE: Filed Vitals:   03/30/15 1128  BP: 106/73  Pulse: 88  Temp: 98.2 F (36.8 C)     Body mass index is 34.52 kg/(m^2).      GENERAL: Patient is alert and oriented and in no acute distress. There is no icterus. HEENT: EOMs intact. Oral exam negative for thrush or lesions. No cervical lymphadenopathy. CVS: S1S2, regular LUNGS: Bilaterally clear to auscultation, no rhonchi. ABDOMEN: Soft, nontender. No hepatosplenomegaly clinically.  NEURO: grossly nonfocal, cranial nerves are intact. Gait unremarkable. EXTREMITIES: No pedal edema. LYMPHATICS: No palpable adenopathy in axillary or inguinal areas.   LAB RESULTS:     Component Value Date/Time   NA 136 02/19/2015 2136   K 4.0 02/19/2015 2136   CL 104 02/19/2015 2136   CO2 25 02/19/2015 2136   GLUCOSE 124* 02/19/2015 2136   BUN 11 02/19/2015 2136   CREATININE 0.55 03/16/2015 1137   CREATININE 0.46 02/19/2015 2136   CALCIUM 8.5* 02/19/2015 2136   PROT 6.3* 03/16/2015 1137   PROT 6.3* 02/19/2015 2136   ALBUMIN 3.8 03/16/2015 1137   ALBUMIN 3.8 02/19/2015 2136   AST 21 03/16/2015 1137   AST 21 02/19/2015 2136   ALT 19 03/16/2015 1137   ALT 19 02/19/2015 2136   ALKPHOS 63 03/16/2015 1137   ALKPHOS 78 02/19/2015 2136   BILITOT 0.6 03/16/2015 1137   GFRNONAA >60 03/16/2015 1137   GFRNONAA >60 02/19/2015 2136   GFRAA >60 03/16/2015 1137   GFRAA >60 02/19/2015 2136    No results found for: SPEP, UPEP  Lab Results  Component Value Date   WBC 4.0 03/30/2015   NEUTROABS 3.0 03/30/2015   HGB 9.7* 03/30/2015   HCT 29.7* 03/30/2015   MCV 98.1 03/30/2015   PLT 268 03/30/2015     STUDIES: No results found.    STAGING: No matching staging information was found for the patient.    ASSESSMENT / PLAN:        Leia Alf, MD   04/04/2015 4:27 AM      Heidi [name]  Telephone:(336) [phone] Fax:(336) [phone]      ID: Heidi [name] OB: 07/08/1941  MR#: [id number]  ZPH#:[id number]  Patient Care Team: Debroah Baller, MD as PCP - General (Family Medicine)  CHIEF COMPLAINT / DIAGNOSIS:  Left breast Invasive Mammary Carcinoma with biopsy-proven left axillary lymph node metastasis. Ultrasound/mammogram on 07/27/14 and reported 10 x 10 x 9 mm mass in the left upper inner breast 9:00 position, multiple thickened left axillary lymph nodes largest measuring 8 x 6 mm. Ultrasound-guided core biopsy of the left breast mass with clip placement, and left axillary  lymph node biopsy on 08/17/14 reported as consistent with invasive mammary carcinoma in both biopsies, ER positive (>90%), PR positive (>90%), HER-2/neu negative (was 2+ on IHC, FISH negative with Her2/CEP17 ratio of 1.18).  Clinically stage T1N1Mx (at least stage IIA)  -  patient on neoadjuvant chemotherapy. Completed AC x 4 (09/28/14 - 12/02/14). Started weekly Taxol on 12/23/14.  HISTORY OF PRESENT ILLNESS:   Patient seen as workin appt today. She has had some nail changes and has developed swelling and pain in the fingertip/nailbed. Otherwise no new complaints. She wants to continue on chemotherapy. No fevers or chills. No nausea or vomiting. Denies any progressive breast masses on self-exam. Denies any worsening paresthesias in extremities.   REVIEW OF SYSTEMS:   ROS As in HPI above. In addition, no new headaches or focal weakness.  No sore throat, cough, shortness of breath, sputum, hemoptysis or chest pain. No abdominal pain, constipation, diarrhea, dysuria or hematuria. PS ECOG 1-2.  PAST MEDICAL HISTORY: Past Medical History  Diagnosis Date  . Hypertension   . Depression   . Hyperlipidemia   . Osteoarthritis   . Insomnia   . Cataracts, bilateral   . UTI (lower urinary tract infection)   . Breast cancer     1990    PAST SURGICAL HISTORY: Past Surgical History  Procedure Laterality Date  . Mastectomy Right     lower 1990    FAMILY  HISTORY Family History  Problem Relation Age of Onset  . Dementia Mother   . Bladder Cancer Father     SOCIAL HISTORY: History  Substance Use Topics  . Smoking status: Former Smoker -- 2.00 packs/day for 25 years    Types: Cigarettes  . Smokeless tobacco: Never Used     Comment: Pt quit 1995  . Alcohol Use: No    Allergies  Allergen Reactions  . Diphenhydramine Other (See Comments)    pt states makes legs very restless and ache  . Hydrochloric Acid     Other reaction(s): Unknown Uncoded Allergy. Allergen: HYDROCHLORLIC ACID.    Current Outpatient Prescriptions  Medication Sig Dispense Refill  . acyclovir (ZOVIRAX) 400 MG tablet Take 400 mg by mouth daily.    Marland Kitchen albuterol (PROVENTIL HFA;VENTOLIN HFA) 108 (90 BASE) MCG/ACT inhaler Inhale 2 puffs into the lungs every 4 (four) hours as needed for wheezing or shortness of breath. 1 Inhaler 2  . apixaban (ELIQUIS) 5 MG TABS tablet Take 1 tablet (5 mg total) by mouth 2 (two) times daily. 60 tablet 6  . clindamycin (CLEOCIN) 300 MG capsule Take 1 capsule (300 mg total) by mouth 4 (four) times daily. 28 capsule 0  . diphenoxylate-atropine (LOMOTIL) 2.5-0.025 MG per tablet Take 1 tablet by mouth as needed.    . Fluticasone-Salmeterol (ADVAIR DISKUS) 250-50 MCG/DOSE AEPB Inhale 1 puff into the lungs 2 (two) times daily. GARGLE AND RINSE AFTER EACH USE. 60 each 3  . furosemide (LASIX) 40 MG tablet Take 1 tablet (40 mg total) by mouth daily as needed. 30 tablet 3  . ibuprofen (ADVIL,MOTRIN) 200 MG tablet Take 200 mg by mouth every 6 (six) hours as needed.    Marland Kitchen losartan (COZAAR) 50 MG tablet Take 50 mg by mouth daily.     . metoprolol succinate (TOPROL-XL) 50 MG 24 hr tablet Take 1 tablet (50 mg total) by mouth daily. Take with or immediately following a meal. 30 tablet 3  . oxyCODONE (OXY IR/ROXICODONE) 5 MG immediate release tablet 1-2 tabs every 4 hours as needed  60 tablet 0  . oxymetazoline (AFRIN) 0.05 % nasal spray Place into the nose.     . potassium chloride (K-DUR,KLOR-CON) 10 MEQ tablet Take 10 mEq by mouth as needed.    Marland Kitchen POTASSIUM CHLORIDE PO Take by mouth.    . potassium chloride SA (K-DUR,KLOR-CON) 20 MEQ tablet Take 1 tablet (20 mEq total) by mouth daily as needed. (Patient not taking: Reported on 03/16/2015) 30 tablet 3  . sertraline (ZOLOFT) 100 MG tablet Take 100 mg by mouth daily.    Marland Kitchen zolpidem (AMBIEN) 10 MG tablet Take 1 tablet (10 mg total) by mouth at bedtime. 30 tablet 1   No current facility-administered medications for this visit.   Facility-Administered Medications Ordered in Other Visits  Medication Dose Route Frequency Provider Last Rate Last Dose  . sodium chloride 0.9 % injection 10 mL  10 mL Intravenous PRN Leia Alf, MD      . sodium chloride 0.9 % injection 10 mL  10 mL Intracatheter PRN Leia Alf, MD   10 mL at 03/16/15 1110    OBJECTIVE: Filed Vitals:   03/30/15 1128  BP: 106/73  Pulse: 88  Temp: 98.2 F (36.8 C)     Body mass index is 34.52 kg/(m^2).      GENERAL: Patient is alert and oriented and in no acute distress. There is no icterus. HEENT: EOMs intact. Oral exam negative for thrush or lesions. No cervical lymphadenopathy. CVS: S1S2, regular LUNGS: Bilaterally clear to auscultation, no rhonchi. ABDOMEN: Soft, nontender. No hepatomegaly.  NEURO: grossly nonfocal, cranial nerves are intact.  EXTREMITIES: No pedal edema. BREASTS: no obvious mass or axillary adenopathy on left side. Exam performed in presence of a nurse.  LAB RESULTS: WBC 4000, ANC 3000, Hb 9.7, platelets 268K.  Nov 2015 - serum CA 27.29 is 25.2        STUDIES: 02/19/15 - CT chest. IMPRESSION:  1. Relatively small pulmonary embolus noted within a segmental branch to the right upper lung lobe. No additional pulmonary embolus seen. 2. Mild patchy atelectasis or scarring within both lower lobes. Minimal pneumonia cannot be excluded at the superior aspect of the right lower lobe. 3. Multiple thoracic  vertebral body hemangiomas incidentally noted.  ASSESSMENT / PLAN:   1. Celluitis/nailbed infection fingertip - will try empiric antibiotic with Clindamycin. Advised to call if no improvement. 2.  Left breast Invasive Mammary Carcinoma with biopsy-proven left axillary lymph node metastasis. Ultrasound/mammogram on 07/27/14 and reported 10 x 10 x 9 mm mass in the left upper inner breast 9:00 position, multiple thickened left axillary lymph nodes largest measuring 8 x 6 mm. Ultrasound-guided core biopsy of the left breast mass with clip placement, and left axillary lymph node biopsy on 08/17/14 reported as consistent with invasive mammary carcinoma in both biopsies, ER positive (>90%), PR positive (>90%), HER-2/neu negative (was 2+ on IHC, FISH negative with Her2/CEP17 ratio of 1.18).  Clinically stage T1N1M0 (at least stage IIA). Repeat mammogram/ultrasound of the left breast on February 16 indicated positive response to neoadjuvant chemotherapy with decrease in size of the mass which is now 7.5 x 6.6 x 5.6 mm, no enlarged axillary lymph nodes seen and also have decreased in size from the prior study - keep scheduled appts the same.  3. Mucositis, thrush - secondary to chemotherapy, now resolved.  4. Dyspnea on exertion, small PE diagnosed 02/19/15 - Patient is seeing Cardiology and Pulmonary. On anticoagulation with Eliquis. In between visits, patient advised to call or come to ER in case  of any worsening symptoms or acute sickness. She is agreeable to this plan.   Leia Alf, MD   04/04/2015 4:27 AM

## 2015-04-02 NOTE — Telephone Encounter (Signed)
Rx called in per Dr Candelaria Stagers ok. Pt notified of this

## 2015-04-06 ENCOUNTER — Inpatient Hospital Stay: Payer: Medicare Other | Attending: Internal Medicine

## 2015-04-06 DIAGNOSIS — Z87440 Personal history of urinary (tract) infections: Secondary | ICD-10-CM | POA: Diagnosis not present

## 2015-04-06 DIAGNOSIS — D649 Anemia, unspecified: Secondary | ICD-10-CM | POA: Diagnosis not present

## 2015-04-06 DIAGNOSIS — M1990 Unspecified osteoarthritis, unspecified site: Secondary | ICD-10-CM | POA: Diagnosis not present

## 2015-04-06 DIAGNOSIS — Z9013 Acquired absence of bilateral breasts and nipples: Secondary | ICD-10-CM | POA: Insufficient documentation

## 2015-04-06 DIAGNOSIS — Z452 Encounter for adjustment and management of vascular access device: Secondary | ICD-10-CM | POA: Diagnosis not present

## 2015-04-06 DIAGNOSIS — R918 Other nonspecific abnormal finding of lung field: Secondary | ICD-10-CM | POA: Diagnosis not present

## 2015-04-06 DIAGNOSIS — G629 Polyneuropathy, unspecified: Secondary | ICD-10-CM | POA: Diagnosis not present

## 2015-04-06 DIAGNOSIS — Z170 Estrogen receptor positive status [ER+]: Secondary | ICD-10-CM | POA: Insufficient documentation

## 2015-04-06 DIAGNOSIS — E785 Hyperlipidemia, unspecified: Secondary | ICD-10-CM | POA: Diagnosis not present

## 2015-04-06 DIAGNOSIS — C50912 Malignant neoplasm of unspecified site of left female breast: Secondary | ICD-10-CM

## 2015-04-06 DIAGNOSIS — Z9221 Personal history of antineoplastic chemotherapy: Secondary | ICD-10-CM | POA: Insufficient documentation

## 2015-04-06 DIAGNOSIS — J449 Chronic obstructive pulmonary disease, unspecified: Secondary | ICD-10-CM | POA: Insufficient documentation

## 2015-04-06 DIAGNOSIS — Z5111 Encounter for antineoplastic chemotherapy: Secondary | ICD-10-CM | POA: Diagnosis present

## 2015-04-06 DIAGNOSIS — R0602 Shortness of breath: Secondary | ICD-10-CM | POA: Insufficient documentation

## 2015-04-06 DIAGNOSIS — I10 Essential (primary) hypertension: Secondary | ICD-10-CM | POA: Insufficient documentation

## 2015-04-06 DIAGNOSIS — C773 Secondary and unspecified malignant neoplasm of axilla and upper limb lymph nodes: Secondary | ICD-10-CM | POA: Insufficient documentation

## 2015-04-06 DIAGNOSIS — F329 Major depressive disorder, single episode, unspecified: Secondary | ICD-10-CM | POA: Insufficient documentation

## 2015-04-06 DIAGNOSIS — Z87891 Personal history of nicotine dependence: Secondary | ICD-10-CM | POA: Diagnosis not present

## 2015-04-06 DIAGNOSIS — Z853 Personal history of malignant neoplasm of breast: Secondary | ICD-10-CM | POA: Diagnosis not present

## 2015-04-06 DIAGNOSIS — Z79899 Other long term (current) drug therapy: Secondary | ICD-10-CM | POA: Insufficient documentation

## 2015-04-06 DIAGNOSIS — C50212 Malignant neoplasm of upper-inner quadrant of left female breast: Secondary | ICD-10-CM | POA: Insufficient documentation

## 2015-04-06 DIAGNOSIS — F419 Anxiety disorder, unspecified: Secondary | ICD-10-CM | POA: Diagnosis not present

## 2015-04-06 LAB — HEPATIC FUNCTION PANEL
ALBUMIN: 3.8 g/dL (ref 3.5–5.0)
ALT: 17 U/L (ref 14–54)
AST: 21 U/L (ref 15–41)
Alkaline Phosphatase: 71 U/L (ref 38–126)
Total Bilirubin: 0.3 mg/dL (ref 0.3–1.2)
Total Protein: 6.6 g/dL (ref 6.5–8.1)

## 2015-04-06 LAB — CBC WITH DIFFERENTIAL/PLATELET
Basophils Absolute: 0 10*3/uL (ref 0–0.1)
Basophils Relative: 1 %
Eosinophils Absolute: 0.1 10*3/uL (ref 0–0.7)
Eosinophils Relative: 2 %
HCT: 33.2 % — ABNORMAL LOW (ref 35.0–47.0)
Hemoglobin: 10.6 g/dL — ABNORMAL LOW (ref 12.0–16.0)
Lymphocytes Relative: 19 %
Lymphs Abs: 0.9 10*3/uL — ABNORMAL LOW (ref 1.0–3.6)
MCH: 31.3 pg (ref 26.0–34.0)
MCHC: 32 g/dL (ref 32.0–36.0)
MCV: 97.9 fL (ref 80.0–100.0)
Monocytes Absolute: 0.7 10*3/uL (ref 0.2–0.9)
Monocytes Relative: 14 %
Neutro Abs: 3 10*3/uL (ref 1.4–6.5)
Neutrophils Relative %: 64 %
Platelets: 322 10*3/uL (ref 150–440)
RBC: 3.39 MIL/uL — ABNORMAL LOW (ref 3.80–5.20)
RDW: 17.1 % — ABNORMAL HIGH (ref 11.5–14.5)
WBC: 4.7 10*3/uL (ref 3.6–11.0)

## 2015-04-06 LAB — CREATININE, SERUM
Creatinine, Ser: 0.84 mg/dL (ref 0.44–1.00)
GFR calc Af Amer: >60 mL/min (ref >60)
GFR calc non Af Amer: >60 mL/min (ref >60)

## 2015-04-08 ENCOUNTER — Ambulatory Visit (INDEPENDENT_AMBULATORY_CARE_PROVIDER_SITE_OTHER): Payer: Medicare Other | Admitting: Surgery

## 2015-04-08 ENCOUNTER — Encounter: Payer: Self-pay | Admitting: Surgery

## 2015-04-08 VITALS — BP 132/80 | HR 105 | Temp 98.0°F | Ht 68.0 in | Wt 226.0 lb

## 2015-04-08 DIAGNOSIS — C50912 Malignant neoplasm of unspecified site of left female breast: Secondary | ICD-10-CM | POA: Diagnosis not present

## 2015-04-08 NOTE — Patient Instructions (Signed)
Please call our office with decision that you make regarding surgery- [phone] and we will be glad to set this up.

## 2015-04-15 ENCOUNTER — Telehealth: Payer: Self-pay | Admitting: *Deleted

## 2015-04-15 NOTE — Telephone Encounter (Signed)
Pt has improper working phone, I left a message per her request to come in to see Dr Ma Hillock 6/21 @ 315

## 2015-04-15 NOTE — Telephone Encounter (Signed)
  Can see Magda Paganini or me tomorrow to start Neurontin and follow-up with Ma Hillock next week.  M

## 2015-04-20 ENCOUNTER — Inpatient Hospital Stay (HOSPITAL_BASED_OUTPATIENT_CLINIC_OR_DEPARTMENT_OTHER): Payer: Medicare Other | Admitting: Internal Medicine

## 2015-04-20 VITALS — BP 119/79 | HR 92 | Temp 98.8°F | Resp 20 | Ht 68.0 in | Wt 226.0 lb

## 2015-04-20 DIAGNOSIS — Z853 Personal history of malignant neoplasm of breast: Secondary | ICD-10-CM

## 2015-04-20 DIAGNOSIS — Z87891 Personal history of nicotine dependence: Secondary | ICD-10-CM

## 2015-04-20 DIAGNOSIS — J449 Chronic obstructive pulmonary disease, unspecified: Secondary | ICD-10-CM

## 2015-04-20 DIAGNOSIS — C50212 Malignant neoplasm of upper-inner quadrant of left female breast: Secondary | ICD-10-CM | POA: Diagnosis not present

## 2015-04-20 DIAGNOSIS — F329 Major depressive disorder, single episode, unspecified: Secondary | ICD-10-CM

## 2015-04-20 DIAGNOSIS — I10 Essential (primary) hypertension: Secondary | ICD-10-CM

## 2015-04-20 DIAGNOSIS — Z9221 Personal history of antineoplastic chemotherapy: Secondary | ICD-10-CM

## 2015-04-20 DIAGNOSIS — C773 Secondary and unspecified malignant neoplasm of axilla and upper limb lymph nodes: Secondary | ICD-10-CM

## 2015-04-20 DIAGNOSIS — Z87440 Personal history of urinary (tract) infections: Secondary | ICD-10-CM

## 2015-04-20 DIAGNOSIS — Z170 Estrogen receptor positive status [ER+]: Secondary | ICD-10-CM | POA: Diagnosis not present

## 2015-04-20 DIAGNOSIS — R0602 Shortness of breath: Secondary | ICD-10-CM

## 2015-04-20 DIAGNOSIS — C50912 Malignant neoplasm of unspecified site of left female breast: Secondary | ICD-10-CM

## 2015-04-20 DIAGNOSIS — Z452 Encounter for adjustment and management of vascular access device: Secondary | ICD-10-CM | POA: Diagnosis not present

## 2015-04-20 DIAGNOSIS — G629 Polyneuropathy, unspecified: Secondary | ICD-10-CM

## 2015-04-20 DIAGNOSIS — Z79899 Other long term (current) drug therapy: Secondary | ICD-10-CM

## 2015-04-20 DIAGNOSIS — F419 Anxiety disorder, unspecified: Secondary | ICD-10-CM

## 2015-04-20 DIAGNOSIS — Z9013 Acquired absence of bilateral breasts and nipples: Secondary | ICD-10-CM

## 2015-04-20 DIAGNOSIS — D649 Anemia, unspecified: Secondary | ICD-10-CM

## 2015-04-20 DIAGNOSIS — R918 Other nonspecific abnormal finding of lung field: Secondary | ICD-10-CM

## 2015-04-20 DIAGNOSIS — E785 Hyperlipidemia, unspecified: Secondary | ICD-10-CM

## 2015-04-20 DIAGNOSIS — M1990 Unspecified osteoarthritis, unspecified site: Secondary | ICD-10-CM

## 2015-04-20 MED ORDER — SODIUM CHLORIDE 0.9 % IJ SOLN
10.0000 mL | INTRAMUSCULAR | Status: DC | PRN
Start: 2015-04-20 — End: 2015-05-01
  Administered 2015-04-20: 10 mL
  Filled 2015-04-20: qty 10

## 2015-04-20 MED ORDER — GABAPENTIN 100 MG PO CAPS: 100.0000 mg | ORAL_CAPSULE | Freq: Three times a day (TID) | ORAL | Status: DC

## 2015-04-20 MED ORDER — HEPARIN SOD (PORK) LOCK FLUSH 100 UNIT/ML IV SOLN
500.0000 [IU] | Freq: Once | INTRAVENOUS | Status: AC
  Administered 2015-04-20: 500 [IU] via INTRAVENOUS

## 2015-04-26 ENCOUNTER — Other Ambulatory Visit: Payer: Self-pay | Admitting: Surgery

## 2015-04-26 DIAGNOSIS — C50912 Malignant neoplasm of unspecified site of left female breast: Secondary | ICD-10-CM

## 2015-04-27 ENCOUNTER — Encounter: Payer: Self-pay | Admitting: Internal Medicine

## 2015-04-27 ENCOUNTER — Ambulatory Visit: Payer: Medicare Other | Admitting: Internal Medicine

## 2015-04-27 ENCOUNTER — Ambulatory Visit (INDEPENDENT_AMBULATORY_CARE_PROVIDER_SITE_OTHER): Payer: Medicare Other | Admitting: Internal Medicine

## 2015-04-27 VITALS — BP 118/80 | HR 85 | Temp 97.9°F | Ht 68.0 in | Wt 223.0 lb

## 2015-04-27 DIAGNOSIS — I2699 Other pulmonary embolism without acute cor pulmonale: Secondary | ICD-10-CM | POA: Diagnosis not present

## 2015-04-27 DIAGNOSIS — R0609 Other forms of dyspnea: Secondary | ICD-10-CM

## 2015-04-27 DIAGNOSIS — J439 Emphysema, unspecified: Secondary | ICD-10-CM

## 2015-04-27 NOTE — Assessment & Plan Note (Signed)
Patient with Hx of breast cancer. Completed 2 months of Eliquis prior to stopping on her own due to running out of samples. Will repeat CT chest this week, if if positive for pulmonary embolus will restart Eliquis after planned left breast lumpectomy.   Plan -Repeat CTA chest -Stop Eliquis -If repeat CTs positive for pulmonary embolus, will restart Eliquis after left breast lumpectomy

## 2015-04-27 NOTE — Patient Instructions (Addendum)
Follow up with Dr. Stevenson Clinch in 3 months - we will try to reschedule your CTA chest for this week, since you stopped Eloquis and you have a procedure scheduled for next week - speak to your cardiologist about blood pressure and fluid pills - cont with diet, exercise (as tolerated).

## 2015-04-27 NOTE — Assessment & Plan Note (Signed)
Multifactorial - low EF, possible chemo related, deconditioning, obesity, mild to moderate restrictive disease, anemia, LV dysfunction  I believe that her shortness of breath with exertion is multifactorial in nature as stated above. I reviewed her CBCs over the last 4-5 months and noted that there is a drop in her hemoglobin from 12 to about 9, also her ejection fraction from her MUGA scan in November 2015 compared to her echo in 2016 has reduced from 60% to about 45%, this was also confirmed on her most recent stress test with a reduced ejection fraction of 48%, which could be adding to her dyspnea. Patient recent high-resolution scan with no significant findings of interstitial lung disease, there is some mild air trapping noted which is consistent with her prior history of smoking in the past. Patient will also be following with Cardiology.    Plan: - pulmonary function testing with no significant obstruction, mild to moderate restriction, most likely due to deconditioning and chest wall/abdominal obesity. - albuterol inhaler - 2puff every 3-4 hours as needed for shortness of breath\wheezing\recurrent cough - continue Advair Diskus - 1 puff twice a day (rinse and gargle after each use). - exercise as tolerated once your strength has returned. - Patient was advised she may require a cardiac catheterization, she will discuss with her cardiologist further - continue with Cardiology follow up.

## 2015-04-27 NOTE — Progress Notes (Signed)
MRN# [id number] Heidi [name] 09/19/1941   CC: Chief Complaint  Patient presents with  . Follow-up    feeling better; Stopped Eliquis 1 week ago; SOB w/activity;       Brief History: HPI 01/21/15 Patient is a pleasant 74 year old female past medical history of left breast cancer, status post chemotherapy, hypertension, hyperlipidemia, insomnia, right total mastectomy in 1990, presenting for dyspnea on exertion. Patient states that his been exertion started about 3 years ago,however in the last 5-6 months it has been worst. No inciting factors 3 years ago. DOE coming from parking lot to waiting area. She denies cough, fever or night sweats in the last 2 months. Has a mild runny nose. No history of seasonal allergies. No recent sick contact Current chemo regiment is Taxol. Previously received adriamycin and cytoxan as part of neoadjuvant chemotherapy for left breast cancer.  In 2014 she saw her PMD for DOE, was given an "inhaler" which did not provide much relief.  Travelled to [place] in 07/2013, was on cruise, was able to perform daily walking with little discomfort at that time.  Plan - 57mwt, pft, HRCT  ROV 02/23/15 Patient presents today for a followup visit, she is accompanied by a friend. At her last visit she was evaluated for shortness of breath especially with exertion that has been gradually occurring for the past 3 years. Off note she has a history of breast cancer and completed a regimen of Adriamycin and Taxol. Since her last visit she had [place] done which showed an ejection fraction of 48%, this is reduced compared to last ejection fraction. After her DEXA scan on last Friday she developed intense shortness of breath and recent back to the emergency room for further evaluation. A CTA of the chest was done that showed a mild/small subsegmental right upper lobe pulmonary embolism. The patient was admitted for 24-hour observation and placed on a liquid, discharged in stable  condition. Currently she is in a wheelchair, stating that she can walk, but feels extremely fatigued and short of breath; this is occurring at the time of discharge. Today patient endorses shortness of breath with exertion, but states that since her discharge she is actually becoming better and would like to start back with more walking and less use of her wheelchair. Today she also had a pulmonary function testing done but would like to discuss the results of these.   Plan: -Repeat CTA chest prior to follow up  - pulmonary function testing with no significant obstruction, mild to moderate restriction, most likely due to deconditioning and chest wall/abdominal obesity. - albuterol inhaler - 2puff every 3-4 hours as needed for shortness of breath\wheezing\recurrent cough - continue Advair Diskus - 1 puff twice a day (rinse and gargle after each use). - exercise as tolerated once your strength has returned. - referral for cardio-pulmonary rehab - continue with Cardiology follow up.   Events since last clinic visit: Patient presents today for follow-up visit of dyspnea on exertion. At her last visit it was noted she had a right subsegmental small pulmonary embolus, was on Eliquis. Patient stated that she has stopped Eliquis's and she ran out of samples last week. She was supposed to have a repeat CTA chest on July, at that point if pulmonary embolus was absent, then her Eliquis would be stopped area Patient in for me today that she is having a left lumpectomy performed next week. Patient is also follow-up with cardiology since her last visit, was advised that she may  need a cardiac catheterization, but has currently declined.  Overall patient states that her clinical condition is improving, and she is not shortness breath that she was at her last visit, she is able to take groceries to and from her car at this time.    Medication:   Current Outpatient Rx  Name  Route  Sig  Dispense  Refill  .  diphenoxylate-atropine (LOMOTIL) 2.5-0.025 MG per tablet   Oral   Take 1 tablet by mouth as needed.         . Fluticasone-Salmeterol (ADVAIR DISKUS) 250-50 MCG/DOSE AEPB   Inhalation   Inhale 1 puff into the lungs 2 (two) times daily. GARGLE AND RINSE AFTER EACH USE.   60 each   3   . furosemide (LASIX) 40 MG tablet   Oral   Take 1 tablet (40 mg total) by mouth daily as needed.   30 tablet   3   . gabapentin (NEURONTIN) 100 MG capsule   Oral   Take 1 capsule (100 mg total) by mouth 3 (three) times daily.   90 capsule   3   . ibuprofen (ADVIL,MOTRIN) 200 MG tablet   Oral   Take 200 mg by mouth every 6 (six) hours as needed.         Marland Kitchen losartan (COZAAR) 50 MG tablet   Oral   Take 50 mg by mouth daily.          . metoprolol succinate (TOPROL-XL) 50 MG 24 hr tablet   Oral   Take 1 tablet (50 mg total) by mouth daily. Take with or immediately following a meal.   30 tablet   3   . potassium chloride SA (K-DUR,KLOR-CON) 20 MEQ tablet   Oral   Take 1 tablet (20 mEq total) by mouth daily as needed.   30 tablet   3   . sertraline (ZOLOFT) 100 MG tablet   Oral   Take 100 mg by mouth daily.         . traMADol (ULTRAM) 50 MG tablet      1 tablet.         Marland Kitchen zolpidem (AMBIEN) 10 MG tablet   Oral   Take 1 tablet (10 mg total) by mouth at bedtime.   30 tablet   1   . EXPIRED: apixaban (ELIQUIS) 5 MG TABS tablet   Oral   Take 1 tablet (5 mg total) by mouth 2 (two) times daily.   60 tablet   6   . PROAIR HFA 108 (90 BASE) MCG/ACT inhaler      2 puffs.           Dispense as written.      Review of Systems: Gen:  Denies  fever, sweats, chills HEENT: Denies blurred vision, double vision, ear pain, eye pain, hearing loss, nose bleeds, sore throat Cvc:  No dizziness, chest pain or heaviness Resp:   Admits to: Mild shortness of breath Gi: Denies swallowing difficulty, stomach pain, nausea or vomiting, diarrhea, constipation, bowel incontinence Gu:  Denies  bladder incontinence, burning urine Ext:   No Joint pain, stiffness or swelling Skin: No skin rash, easy bruising or bleeding or hives Endoc:  No polyuria, polydipsia , polyphagia or weight change Other:  All other systems negative  Allergies:  4-dimethylaminopheol; Diphenhydramine; and Hydrochloric acid  Physical Examination:  VS: BP 118/80 mmHg  Pulse 85  Temp(Src) 97.9 F (36.6 C) (Oral)  Ht 5\' 8"  (1.727 m)  Wt 223 lb (101.152  kg)  BMI 33.91 kg/m2  SpO2 100%  General Appearance: No distress  HEENT: PERRLA, no ptosis, no other lesions noticed Pulmonary:normal breath sounds., diaphragmatic excursion normal.No wheezing, No rales   Cardiovascular:  Normal S1,S2.  No m/r/g.     Abdomen:Exam: Benign, Soft, non-tender, No masses  Skin:   warm, no rashes, no ecchymosis  Extremities: normal, no cyanosis, clubbing, warm with normal capillary refill.  Mild bilateral lower extremity pitting edema   Assessment and Plan:74 year old female past medical history of breast cancer status post chemotherapy with Adriamycin and Taxol, presenting for followup visit of persistent dyspnea on exertion. Right subsegmental pulmonary embolus Patient with Hx of breast cancer. Completed 2 months of Eliquis prior to stopping on her own due to running out of samples. Will repeat CT chest this week, if if positive for pulmonary embolus will restart Eliquis after planned left breast lumpectomy.   Plan -Repeat CTA chest -Stop Eliquis -If repeat CTs positive for pulmonary embolus, will restart Eliquis after left breast lumpectomy  COPD type A COPD based on hx of smoking, chronic sob and air trapping noted on HRCT.  Continue with Advair 250/50    DOE (dyspnea on exertion) Multifactorial - low EF, possible chemo related, deconditioning, obesity, mild to moderate restrictive disease, anemia, LV dysfunction  I believe that her shortness of breath with exertion is multifactorial in nature as stated above.  I reviewed her CBCs over the last 4-5 months and noted that there is a drop in her hemoglobin from 12 to about 9, also her ejection fraction from her MUGA scan in November 2015 compared to her echo in 2016 has reduced from 60% to about 45%, this was also confirmed on her most recent stress test with a reduced ejection fraction of 48%, which could be adding to her dyspnea. Patient recent high-resolution scan with no significant findings of interstitial lung disease, there is some mild air trapping noted which is consistent with her prior history of smoking in the past. Patient will also be following with Cardiology.    Plan: - pulmonary function testing with no significant obstruction, mild to moderate restriction, most likely due to deconditioning and chest wall/abdominal obesity. - albuterol inhaler - 2puff every 3-4 hours as needed for shortness of breath\wheezing\recurrent cough - continue Advair Diskus - 1 puff twice a day (rinse and gargle after each use). - exercise as tolerated once your strength has returned. - Patient was advised she may require a cardiac catheterization, she will discuss with her cardiologist further - continue with Cardiology follow up.         Updated Medication List Outpatient Encounter Prescriptions as of 04/27/2015  Medication Sig  . diphenoxylate-atropine (LOMOTIL) 2.5-0.025 MG per tablet Take 1 tablet by mouth as needed.  . Fluticasone-Salmeterol (ADVAIR DISKUS) 250-50 MCG/DOSE AEPB Inhale 1 puff into the lungs 2 (two) times daily. GARGLE AND RINSE AFTER EACH USE.  . furosemide (LASIX) 40 MG tablet Take 1 tablet (40 mg total) by mouth daily as needed.  . gabapentin (NEURONTIN) 100 MG capsule Take 1 capsule (100 mg total) by mouth 3 (three) times daily.  Marland Kitchen ibuprofen (ADVIL,MOTRIN) 200 MG tablet Take 200 mg by mouth every 6 (six) hours as needed.  Marland Kitchen losartan (COZAAR) 50 MG tablet Take 50 mg by mouth daily.   . metoprolol succinate (TOPROL-XL) 50 MG 24 hr  tablet Take 1 tablet (50 mg total) by mouth daily. Take with or immediately following a meal.  . potassium chloride SA (K-DUR,KLOR-CON) 20 MEQ  tablet Take 1 tablet (20 mEq total) by mouth daily as needed.  . sertraline (ZOLOFT) 100 MG tablet Take 100 mg by mouth daily.  . traMADol (ULTRAM) 50 MG tablet 1 tablet.  Marland Kitchen zolpidem (AMBIEN) 10 MG tablet Take 1 tablet (10 mg total) by mouth at bedtime.  Marland Kitchen apixaban (ELIQUIS) 5 MG TABS tablet Take 1 tablet (5 mg total) by mouth 2 (two) times daily.  Marland Kitchen PROAIR HFA 108 (90 BASE) MCG/ACT inhaler 2 puffs.   Facility-Administered Encounter Medications as of 04/27/2015  Medication  . sodium chloride 0.9 % injection 10 mL  . sodium chloride 0.9 % injection 10 mL  . sodium chloride 0.9 % injection 10 mL    Orders for this visit: No orders of the defined types were placed in this encounter.    Thank  you for the visitation and for allowing  [facility] to assist in the care of your patient. Our recommendations are noted above.  Please contact us if we can be of further service.  Vilinda Boehringer, MD  Pulmonary and Critical Care Office Number: [phone]

## 2015-04-27 NOTE — Assessment & Plan Note (Signed)
COPD based on hx of smoking, chronic sob and air trapping noted on HRCT.  Continue with Advair 250/50

## 2015-04-29 ENCOUNTER — Encounter
Admission: RE | Admit: 2015-04-29 | Discharge: 2015-04-29 | Disposition: A | Discharge status: Home / Self Care | Payer: Medicare Other | Source: Ambulatory Visit | Attending: Surgery | Admitting: Surgery

## 2015-04-29 ENCOUNTER — Telehealth: Payer: Self-pay | Admitting: Internal Medicine

## 2015-04-29 ENCOUNTER — Ambulatory Visit
Admission: RE | Admit: 2015-04-29 | Discharge: 2015-04-29 | Disposition: A | Discharge status: Home / Self Care | Payer: Medicare Other | Source: Ambulatory Visit | Attending: Internal Medicine | Admitting: Internal Medicine

## 2015-04-29 ENCOUNTER — Other Ambulatory Visit: Payer: Self-pay

## 2015-04-29 DIAGNOSIS — J439 Emphysema, unspecified: Secondary | ICD-10-CM | POA: Insufficient documentation

## 2015-04-29 DIAGNOSIS — R0609 Other forms of dyspnea: Secondary | ICD-10-CM | POA: Insufficient documentation

## 2015-04-29 DIAGNOSIS — N63 Unspecified lump in unspecified breast: Secondary | ICD-10-CM

## 2015-04-29 HISTORY — DX: Chronic obstructive pulmonary disease, unspecified: J44.9

## 2015-04-29 HISTORY — DX: Anxiety disorder, unspecified: F41.9

## 2015-04-29 HISTORY — DX: Anemia, unspecified: D64.9

## 2015-04-29 HISTORY — DX: Adverse effect of antineoplastic and immunosuppressive drugs, initial encounter: T45.1X5A

## 2015-04-29 HISTORY — DX: Adverse effect of antineoplastic and immunosuppressive drugs, initial encounter: G62.0

## 2015-04-29 LAB — BASIC METABOLIC PANEL
ANION GAP: 11 (ref 5–15)
BUN: 17 mg/dL (ref 6–20)
CALCIUM: 9.3 mg/dL (ref 8.9–10.3)
CO2: 25 mmol/L (ref 22–32)
Chloride: 103 mmol/L (ref 101–111)
Creatinine, Ser: 0.89 mg/dL (ref 0.44–1.00)
GFR calc non Af Amer: >60 mL/min (ref >60)
Glucose, Bld: 119 mg/dL — ABNORMAL HIGH (ref 65–99)
POTASSIUM: 4.2 mmol/L (ref 3.5–5.1)
SODIUM: 139 mmol/L (ref 135–145)

## 2015-04-29 LAB — CBC
HCT: 34.8 % — ABNORMAL LOW (ref 35.0–47.0)
Hemoglobin: 11.3 g/dL — ABNORMAL LOW (ref 12.0–16.0)
MCH: 31.9 pg (ref 26.0–34.0)
MCHC: 32.5 g/dL (ref 32.0–36.0)
MCV: 98.3 fL (ref 80.0–100.0)
PLATELETS: 313 10*3/uL (ref 150–440)
RBC: 3.54 MIL/uL — AB (ref 3.80–5.20)
RDW: 16.8 % — ABNORMAL HIGH (ref 11.5–14.5)
WBC: 7.6 10*3/uL (ref 3.6–11.0)

## 2015-04-29 LAB — DIFFERENTIAL
BASOS ABS: 0.1 10*3/uL (ref 0–0.1)
Basophils Relative: 1 %
EOS PCT: 4 %
Eosinophils Absolute: 0.3 10*3/uL (ref 0–0.7)
Lymphocytes Relative: 17 %
Lymphs Abs: 1.3 10*3/uL (ref 1.0–3.6)
MONO ABS: 0.7 10*3/uL (ref 0.2–0.9)
Monocytes Relative: 9 %
Neutro Abs: 5.3 10*3/uL (ref 1.4–6.5)
Neutrophils Relative %: 69 %

## 2015-04-29 MED ORDER — IOHEXOL 350 MG/ML SOLN
100.0000 mL | Freq: Once | INTRAVENOUS | Status: AC | PRN
  Administered 2015-04-29: 100 mL via INTRAVENOUS

## 2015-04-29 NOTE — Pre-Procedure Instructions (Signed)
EKG to anesthesia for review. 

## 2015-04-29 NOTE — Telephone Encounter (Signed)
No pulmonary embolus noted on the CT.  -VM

## 2015-04-29 NOTE — Patient Instructions (Addendum)
  Your procedure is scheduled on: May 05, 2015 (Wednesday) Report to Mammography. ([place]) at 8:00 amTo find out your arrival time please call [phone] between 1PM - 3PM on .  Remember: Instructions that are not followed completely may result in serious medical risk, up to and including death, or upon the discretion of your surgeon and anesthesiologist your surgery may need to be rescheduled.    __x__ 1. Do not eat food or drink liquids after midnight. No gum chewing or hard candies.     ____ 2. No Alcohol for 24 hours before or after surgery.   ____ 3. Bring all medications with you on the day of surgery if instructed.    __x__ 4. Notify your doctor if there is any change in your medical condition     (cold, fever, infections).     Do not wear jewelry, make-up, hairpins, clips or nail polish.  Do not wear lotions, powders, or perfumes. You may wear deodorant.  Do not shave 48 hours prior to surgery. Men may shave face and neck.  Do not bring valuables to the hospital.    [facility] is not responsible for any belongings or valuables.               Contacts, dentures or bridgework may not be worn into surgery.  Leave your suitcase in the car. After surgery it may be brought to your room.  For patients admitted to the hospital, discharge time is determined by your                treatment team.   Patients discharged the day of surgery will not be allowed to drive home.   Please read over the following fact sheets that you were given:   Surgical Site Infection Prevention   _x___ Take these medicines the morning of surgery with A SIP OF WATER:    1. Gabapentin  2. Losartan  3. Metoprolol  4.Zoloft  5.  6.  ____ Fleet Enema (as directed)   __x__ Use CHG Soap as directed  __x__ Use inhalers on the day of surgery (ProAir and Advair inhalers and bring to hospital) ____ Stop metformin 2 days prior to surgery    ____ Take 1/2 of usual insulin dose the night  before surgery and none on the morning of surgery.   __x Stop Coumadin/Plavix/aspirin on (PATIENT STOPPED ELIQUIS ONE WEEK AGO WITHOUT BEING INSTRUCTED, STATED SHE RAN OUT AND NEVER HAD IT REFILLED)  __x Stop Anti-inflammatories on (STOP IBUPROFEN NOW)  ____ Stop supplements until after surgery.    ____ Bring C-Pap to the hospital.

## 2015-04-29 NOTE — Telephone Encounter (Signed)
Pt is requesting results from her CT that was performed today.  Dr. Stevenson Clinch - please advise. Thanks!

## 2015-04-30 ENCOUNTER — Telehealth: Payer: Self-pay | Admitting: Respiratory Therapy

## 2015-04-30 NOTE — Telephone Encounter (Signed)
Pt is aware of CT results. 

## 2015-04-30 NOTE — Telephone Encounter (Signed)
Heidi [name] called today to inform us that she will not be attending Pulmonary Rehab due to having a large co-pay through her insurance.

## 2015-05-01 NOTE — Progress Notes (Signed)
[place]  [phone]) [phone] Fax:(336) [phone]     ID: Heidi [name] OB: October 18, 1941  MR#: [id number]  UMP#:[id number]  Patient Care Team: [name], MD as PCP - General (Family Medicine)  CHIEF COMPLAINT/DIAGNOSIS:  Left breast Invasive Mammary Carcinoma with biopsy-proven left axillary lymph node metastasis. Ultrasound/mammogram on 07/27/14 and reported 10 x 10 x 9 mm mass in the left upper inner breast 9:00 position, multiple thickened left axillary lymph nodes largest measuring 8 x 6 mm. Ultrasound-guided core biopsy of the left breast mass with clip placement, and left axillary lymph node biopsy on 08/17/14 reported as consistent with invasive mammary carcinoma in both biopsies, ER positive (>90%), PR positive (>90%), HER-2/neu negative (was 2+ on IHC, FISH negative with Her2/CEP17 ratio of 1.18).  Clinically stage T1N1Mx (at least stage IIA)  -  patient on neoadjuvant chemotherapy. Completed AC x 4 (09/28/14 - 12/02/14). Started weekly Taxol on 12/23/14.   HISTORY OF PRESENT ILLNESS:    REVIEW OF SYSTEMS:   ROS  As in HPI above. In addition, no fever, chills or sweats. No new headaches or focal weakness.  No new mood disturbances. No  sore throat, cough, shortness of breath, sputum, hemoptysis or chest pain. No dizziness or palpitation. No abdominal pain, constipation, diarrhea, dysuria or hematuria. No new skin rash or bleeding symptoms. No new paresthesias in extremities.  Otherwise, a complete review of systems is negative.  PAST MEDICAL HISTORY: Past Medical History  Diagnosis Date  . Hypertension   . Depression   . Hyperlipidemia   . Osteoarthritis   . Insomnia   . Cataracts, bilateral   . UTI (lower urinary tract infection)   . Anxiety   . COPD (chronic obstructive pulmonary disease)     mild COPD  . Shortness of breath dyspnea   . Anemia   . 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 Breast cancer     1990 right breast. 07/2014 left  breast cancer    PAST SURGICAL HISTORY: Past Surgical History  Procedure Laterality Date  . Mastectomy Right     lower 1990  . Eye surgery Bilateral     cataract extractions    FAMILY HISTORY: Family History  Problem Relation Age of Onset  . Dementia Mother   . Bladder Cancer Father     ADVANCED DIRECTIVES:  <no information>  SOCIAL HISTORY: History  Substance Use Topics  . Smoking status: Former Smoker -- 2.00 packs/day for 25 years    Types: Cigarettes    Quit date: 04/28/1992  . Smokeless tobacco: Never Used     Comment: Pt quit 1995  . Alcohol Use: No    Allergies  Allergen Reactions  . Diphenhydramine Other (See Comments)    pt states makes legs very restless and ache  . Hydrochloric Acid     Other reaction(s): Unknown Uncoded Allergy. Allergen: HYDROCHLORLIC ACID.    Current Outpatient Prescriptions  Medication Sig Dispense Refill  . apixaban (ELIQUIS) 5 MG TABS tablet Take 1 tablet (5 mg total) by mouth 2 (two) times daily. 60 tablet 6  . diphenoxylate-atropine (LOMOTIL) 2.5-0.025 MG per tablet Take 1 tablet by mouth as needed.    . Fluticasone-Salmeterol (ADVAIR DISKUS) 250-50 MCG/DOSE AEPB Inhale 1 puff into the lungs 2 (two) times daily. GARGLE AND RINSE AFTER EACH USE. 60 each 3  . furosemide (LASIX) 40 MG tablet Take 1 tablet (40 mg total) by mouth daily as needed. (Patient taking differently: Take 40 mg by mouth daily. )  30 tablet 3  . ibuprofen (ADVIL,MOTRIN) 200 MG tablet Take 200 mg by mouth every 6 (six) hours as needed.    Marland Kitchen losartan (COZAAR) 50 MG tablet Take 50 mg by mouth daily.     . metoprolol succinate (TOPROL-XL) 50 MG 24 hr tablet Take 1 tablet (50 mg total) by mouth daily. Take with or immediately following a meal. 30 tablet 3  . potassium chloride SA (K-DUR,KLOR-CON) 20 MEQ tablet Take 1 tablet (20 mEq total) by mouth daily as needed. (Patient taking differently: Take 20 mEq by mouth daily. ) 30 tablet 3  . PROAIR HFA 108 (90 BASE)  MCG/ACT inhaler Inhale 2 puffs into the lungs every 6 (six) hours as needed.     . sertraline (ZOLOFT) 100 MG tablet Take 100 mg by mouth daily.    . traMADol (ULTRAM) 50 MG tablet Take 50 mg by mouth 2 (two) times daily.     Marland Kitchen zolpidem (AMBIEN) 10 MG tablet Take 1 tablet (10 mg total) by mouth at bedtime. 30 tablet 1  . gabapentin (NEURONTIN) 100 MG capsule Take 1 capsule (100 mg total) by mouth 3 (three) times daily. 90 capsule 3   Current Facility-Administered Medications  Medication Dose Route Frequency Provider Last Rate Last Dose  . sodium chloride 0.9 % injection 10 mL  10 mL Intracatheter PRN Leia Alf, MD   10 mL at 04/20/15 1705   Facility-Administered Medications Ordered in Other Visits  Medication Dose Route Frequency Provider Last Rate Last Dose  . sodium chloride 0.9 % injection 10 mL  10 mL Intravenous PRN Leia Alf, MD      . sodium chloride 0.9 % injection 10 mL  10 mL Intracatheter PRN Leia Alf, MD   10 mL at 03/16/15 1110    OBJECTIVE: Filed Vitals:   04/20/15 1540  BP: 119/79  Pulse: 92  Temp: 98.8 F (37.1 C)  Resp: 20     Body mass index is 34.37 kg/(m^2).      GENERAL: Patient is alert and oriented and in no acute distress. There is no icterus. HEENT: EOMs intact. Oral exam negative for thrush or lesions. No cervical lymphadenopathy. CVS: S1S2, regular LUNGS: Bilaterally clear to auscultation, no rhonchi. ABDOMEN: Soft, nontender. No hepatosplenomegaly clinically.  NEURO: grossly nonfocal, cranial nerves are intact. Gait unremarkable. EXTREMITIES: No pedal edema. LYMPHATICS: No palpable adenopathy in axillary or inguinal areas.   LAB RESULTS:     Component Value Date/Time   NA 139 04/29/2015 0941   NA 136 02/19/2015 2136   K 4.2 04/29/2015 0941   K 4.0 02/19/2015 2136   CL 103 04/29/2015 0941   CL 104 02/19/2015 2136   CO2 25 04/29/2015 0941   CO2 25 02/19/2015 2136   GLUCOSE 119* 04/29/2015 0941   GLUCOSE 124* 02/19/2015 2136    BUN 17 04/29/2015 0941   BUN 11 02/19/2015 2136   CREATININE 0.89 04/29/2015 0941   CREATININE 0.46 02/19/2015 2136   CALCIUM 9.3 04/29/2015 0941   CALCIUM 8.5* 02/19/2015 2136   PROT 6.6 04/06/2015 1326   PROT 6.3* 02/19/2015 2136   ALBUMIN 3.8 04/06/2015 1326   ALBUMIN 3.8 02/19/2015 2136   AST 21 04/06/2015 1326   AST 21 02/19/2015 2136   ALT 17 04/06/2015 1326   ALT 19 02/19/2015 2136   ALKPHOS 71 04/06/2015 1326   ALKPHOS 78 02/19/2015 2136   BILITOT 0.3 04/06/2015 1326   GFRNONAA >60 04/29/2015 0941   GFRNONAA >60 02/19/2015 2136   GFRAA >  60 04/29/2015 0941   GFRAA >60 02/19/2015 2136    No results found for: SPEP, UPEP  Lab Results  Component Value Date   WBC 7.6 04/29/2015   NEUTROABS 5.3 04/29/2015   HGB 11.3* 04/29/2015   HCT 34.8* 04/29/2015   MCV 98.3 04/29/2015   PLT 313 04/29/2015     STUDIES:   STAGING: No matching staging information was found for the patient.    ASSESSMENT / PLAN:        Leia Alf, MD   05/01/2015 6:54 PM      [place]  Telephone:(336) [phone] Fax:(336) [phone]     ID: Heidi [name] OB: 13-Jan-1941  MR#: [id number]  JYN#:[id number]  Patient Care Team: [name], MD as PCP - General (Family Medicine)  CHIEF COMPLAINT / DIAGNOSIS:  Left breast Invasive Mammary Carcinoma with biopsy-proven left axillary lymph node metastasis. Ultrasound/mammogram on 07/27/14 and reported 10 x 10 x 9 mm mass in the left upper inner breast 9:00 position, multiple thickened left axillary lymph nodes largest measuring 8 x 6 mm. Ultrasound-guided core biopsy of the left breast mass with clip placement, and left axillary lymph node biopsy on 08/17/14 reported as consistent with invasive mammary carcinoma in both biopsies, ER positive (>90%), PR positive (>90%), HER-2/neu negative (was 2+ on IHC, FISH negative with Her2/CEP17 ratio of 1.18).  Clinically stage T1N1Mx (at least stage IIA)  -  patient on neoadjuvant  chemotherapy. Completed AC x 4 (09/28/14 - 12/02/14). Started weekly Taxol on 12/23/14.  HISTORY OF PRESENT ILLNESS:   Patient returns for followup and plan continued treatment. States she has constant neuropathy in both hands and feet, with difficulty in using hands and also feeling ground when she walks but denies imbalance or falls.States nail changes are improving. Otherwise no new complaints. She does no want to take anymore Taxol treatments and wants to stop chemotherapy. No fevers or chills. No nausea or vomiting. Denies any progressive breast masses on self-exam.   REVIEW OF SYSTEMS:   ROS As in HPI above. In addition, no fevers. No new headaches or focal weakness.  No sore throat, cough, shortness of breath, sputum, hemoptysis or chest pain. No abdominal pain, constipation, diarrhea, dysuria or hematuria. PS ECOG 2.  PAST MEDICAL HISTORY: Past Medical History  Diagnosis Date  . Hypertension   . Depression   . Hyperlipidemia   . Osteoarthritis   . Insomnia   . Cataracts, bilateral   . UTI (lower urinary tract infection)   . Anxiety   . COPD (chronic obstructive pulmonary disease)     mild COPD  . Shortness of breath dyspnea   . Anemia   . 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 Breast cancer     1990 right breast. 07/2014 left breast cancer    PAST SURGICAL HISTORY: Past Surgical History  Procedure Laterality Date  . Mastectomy Right     lower 1990  . Eye surgery Bilateral     cataract extractions    FAMILY HISTORY Family History  Problem Relation Age of Onset  . Dementia Mother   . Bladder Cancer Father     SOCIAL HISTORY: History  Substance Use Topics  . Smoking status: Former Smoker -- 2.00 packs/day for 25 years    Types: Cigarettes    Quit date: 04/28/1992  . Smokeless tobacco: Never Used     Comment: Pt quit 1995  . Alcohol Use: No    Allergies  Allergen Reactions  . Diphenhydramine  Other (See Comments)    pt states makes legs very restless and  ache  . Hydrochloric Acid     Other reaction(s): Unknown Uncoded Allergy. Allergen: HYDROCHLORLIC ACID.    Current Outpatient Prescriptions  Medication Sig Dispense Refill  . apixaban (ELIQUIS) 5 MG TABS tablet Take 1 tablet (5 mg total) by mouth 2 (two) times daily. 60 tablet 6  . diphenoxylate-atropine (LOMOTIL) 2.5-0.025 MG per tablet Take 1 tablet by mouth as needed.    . Fluticasone-Salmeterol (ADVAIR DISKUS) 250-50 MCG/DOSE AEPB Inhale 1 puff into the lungs 2 (two) times daily. GARGLE AND RINSE AFTER EACH USE. 60 each 3  . furosemide (LASIX) 40 MG tablet Take 1 tablet (40 mg total) by mouth daily as needed. (Patient taking differently: Take 40 mg by mouth daily. ) 30 tablet 3  . ibuprofen (ADVIL,MOTRIN) 200 MG tablet Take 200 mg by mouth every 6 (six) hours as needed.    Marland Kitchen losartan (COZAAR) 50 MG tablet Take 50 mg by mouth daily.     . metoprolol succinate (TOPROL-XL) 50 MG 24 hr tablet Take 1 tablet (50 mg total) by mouth daily. Take with or immediately following a meal. 30 tablet 3  . potassium chloride SA (K-DUR,KLOR-CON) 20 MEQ tablet Take 1 tablet (20 mEq total) by mouth daily as needed. (Patient taking differently: Take 20 mEq by mouth daily. ) 30 tablet 3  . PROAIR HFA 108 (90 BASE) MCG/ACT inhaler Inhale 2 puffs into the lungs every 6 (six) hours as needed.     . sertraline (ZOLOFT) 100 MG tablet Take 100 mg by mouth daily.    . traMADol (ULTRAM) 50 MG tablet Take 50 mg by mouth 2 (two) times daily.     Marland Kitchen zolpidem (AMBIEN) 10 MG tablet Take 1 tablet (10 mg total) by mouth at bedtime. 30 tablet 1  . gabapentin (NEURONTIN) 100 MG capsule Take 1 capsule (100 mg total) by mouth 3 (three) times daily. 90 capsule 3   Current Facility-Administered Medications  Medication Dose Route Frequency Provider Last Rate Last Dose  . sodium chloride 0.9 % injection 10 mL  10 mL Intracatheter PRN Leia Alf, MD   10 mL at 04/20/15 1705   Facility-Administered Medications Ordered in Other  Visits  Medication Dose Route Frequency Provider Last Rate Last Dose  . sodium chloride 0.9 % injection 10 mL  10 mL Intravenous PRN Leia Alf, MD      . sodium chloride 0.9 % injection 10 mL  10 mL Intracatheter PRN Leia Alf, MD   10 mL at 03/16/15 1110    OBJECTIVE: Filed Vitals:   04/20/15 1540  BP: 119/79  Pulse: 92  Temp: 98.8 F (37.1 C)  Resp: 20     Body mass index is 34.37 kg/(m^2).      GENERAL: Alert and oriented and in no acute distress. No icterus. HEENT: EOMs intact. No cervical lymphadenopathy. LUNGS: Bilaterally clear to auscultation, no rhonchi. ABDOMEN: Soft, nontender. No hepatomegaly.  NEURO: grossly nonfocal, cranial nerves are intact.  EXTREMITIES: No pedal edema. BREASTS: no obvious mass or axillary adenopathy on left side. Exam performed in presence of a nurse.  LAB RESULTS: WBC 4000, ANC 3000, Hb 9.7, platelets 268K.  Nov 2015 - serum CA 27.29 is 25.2        STUDIES: 02/19/15 - CT chest. IMPRESSION:  1. Relatively small pulmonary embolus noted within a segmental branch to the right upper lung lobe. No additional pulmonary embolus seen. 2. Mild patchy atelectasis or  scarring within both lower lobes. Minimal pneumonia cannot be excluded at the superior aspect of the right lower lobe. 3. Multiple thoracic vertebral body hemangiomas incidentally noted.  ASSESSMENT / PLAN:   1. Celluitis/nailbed infection fingertip - improving well, has completed antibiotic with Clindamycin.   2. Left breast Invasive Mammary Carcinoma with biopsy-proven left axillary lymph node metastasis. Ultrasound/mammogram on 07/27/14 and reported 10 x 10 x 9 mm mass in the left upper inner breast 9:00 position, multiple thickened left axillary lymph nodes largest measuring 8 x 6 mm. Ultrasound-guided core biopsy of the left breast mass with clip placement, and left axillary lymph node biopsy on 08/17/14 reported as consistent with invasive mammary carcinoma in both biopsies, ER  positive (>90%), PR positive (>90%), HER-2/neu negative (was 2+ on IHC, FISH negative with Her2/CEP17 ratio of 1.18).  Clinically stage T1N1M0 (at least stage IIA). Repeat mammogram/ultrasound of the left breast on February 16 indicated positive response to neoadjuvant chemotherapy with decrease in size of the mass which is now 7.5 x 6.6 x 5.6 mm, no enlarged axillary lymph nodes seen and also have decreased in size from the prior study - has grade 2 peripheral neuropathy from taxol treatment. Will start gabapentin 100 mg TID. She does no want to take anymore Taxol treatments and wants to stop chemotherapy, understands losing benefit of not completing taxol treatments. Plan is to proceed with surgery (lumpectomy and axillary node dissection) in next couple of weeks. Will f/u at 6 weeks and plan continued treatment based upon surgical pathology report. 3. Dyspnea on exertion, small PE diagnosed 02/19/15 - Patient is seeing Cardiology and Pulmonary. On anticoagulation with Eliquis. 4. In between visits, patient advised to call or come to ER in case of any worsening symptoms or acute sickness. She is agreeable to this plan.   Leia Alf, MD   05/01/2015 6:54 PM

## 2015-05-03 NOTE — Progress Notes (Signed)
Heidi [name] is a 74 y.o. female admitted for left partial mastectomy and biopsy.  HPI: She was diagnosed recently with a left breast carcinoma and a positive lymph node on biopsy. She underwent chemotherapy and she presents for more definitive surgical intervention.  Past Medical History  Diagnosis Date  . Hypertension   . Depression   . Hyperlipidemia   . Osteoarthritis   . Insomnia   . Cataracts, bilateral   . UTI (lower urinary tract infection)   . Anxiety   . COPD (chronic obstructive pulmonary disease)     mild COPD  . Shortness of breath dyspnea   . Anemia   . 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 Breast cancer     1990 right breast. 07/2014 left breast cancer   Past Surgical History  Procedure Laterality Date  . Mastectomy Right     lower 1990  . Eye surgery Bilateral     cataract extractions   History   Social History  . Marital Status: Divorced    Spouse Name: N/A  . Number of Children: N/A  . Years of Education: N/A   Social History Main Topics  . Smoking status: Former Smoker -- 2.00 packs/day for 25 years    Types: Cigarettes    Quit date: 04/28/1992  . Smokeless tobacco: Never Used     Comment: Pt quit 1995  . Alcohol Use: No  . Drug Use: No  . Sexual Activity: Not on file   Other Topics Concern  . None   Social History Narrative     ROS 10 point review of systems was carried out and is otherwise unremarkable.   PHYSICAL EXAM: BP 132/80 mmHg  Pulse 105  Temp(Src) 98 F (36.7 C) (Oral)  Ht 5\' 8"  (1.727 m)  Wt 102.513 kg (226 lb)  BMI 34.37 kg/m2  Physical Exam  Constitutional: She is oriented to person, place, and time. She appears well-developed and well-nourished.  HENT:  Head: Normocephalic and atraumatic.  Eyes: Conjunctivae are normal. Pupils are equal, round, and reactive to light.  Neck: Normal range of motion. Neck supple.  Cardiovascular: Normal rate and regular rhythm.   Pulmonary/Chest: Effort normal and breath  sounds normal.  Abdominal: Soft. Bowel sounds are normal.  Musculoskeletal: Normal range of motion. She exhibits edema.  Neurological: She is alert and oriented to person, place, and time.  Skin: Skin is warm and dry.  Psychiatric: She has a normal mood and affect. Her behavior is normal.   the breast lesion is not palpable.   Impression/Plan: She is admitted with a plan to pursuing a left partial mastectomy and possible sentinel lymph node biopsy. This plan has been outlined to the patient detail and she is in agreement. Risks benefits and options have been outlined and accepted   Dia Crawford III, MD  05/03/2015, 3:41 PM

## 2015-05-04 ENCOUNTER — Ambulatory Visit: Payer: Medicare Other | Admitting: Internal Medicine

## 2015-05-04 ENCOUNTER — Ambulatory Visit: Payer: Medicare Other

## 2015-05-04 ENCOUNTER — Telehealth: Payer: Self-pay | Admitting: Cardiovascular Disease

## 2015-05-04 NOTE — Telephone Encounter (Signed)
[facility] has patient scheduled for surgery at 730 am tomorrow.  Office said they faxed clearance form.  Can we get this ASAP?

## 2015-05-04 NOTE — Pre-Procedure Instructions (Signed)
Cardiac clearance requested by Dr. Laureen Abrahams . Anesthesia request for clearance faxed to Dr. Pat Patrick office  and spoke to [facility]

## 2015-05-04 NOTE — Telephone Encounter (Signed)
Patient has surgery with [facility] tomorrow at 730 office said they faxed clearance.  Can we get this back to them today??  Please call [name] [phone].   Thank YOU

## 2015-05-05 ENCOUNTER — Ambulatory Visit (INDEPENDENT_AMBULATORY_CARE_PROVIDER_SITE_OTHER): Payer: Medicare Other | Admitting: Cardiovascular Disease

## 2015-05-05 ENCOUNTER — Encounter: Payer: Self-pay | Admitting: Cardiovascular Disease

## 2015-05-05 ENCOUNTER — Telehealth: Payer: Self-pay

## 2015-05-05 ENCOUNTER — Ambulatory Visit: Admission: RE | Admit: 2015-05-05 | Payer: Medicare Other | Source: Ambulatory Visit

## 2015-05-05 ENCOUNTER — Encounter: Admission: RE | Payer: Self-pay | Source: Ambulatory Visit

## 2015-05-05 ENCOUNTER — Other Ambulatory Visit: Payer: Self-pay | Admitting: Surgery

## 2015-05-05 ENCOUNTER — Encounter: Admission: RE | Admit: 2015-05-05 | Payer: Medicare Other | Source: Ambulatory Visit

## 2015-05-05 ENCOUNTER — Ambulatory Visit: Admission: RE | Admit: 2015-05-05 | Payer: Medicare Other | Source: Ambulatory Visit | Admitting: Surgery

## 2015-05-05 VITALS — BP 120/78 | HR 79 | Ht 68.0 in | Wt 220.8 lb

## 2015-05-05 DIAGNOSIS — I10 Essential (primary) hypertension: Secondary | ICD-10-CM | POA: Diagnosis not present

## 2015-05-05 DIAGNOSIS — R0609 Other forms of dyspnea: Secondary | ICD-10-CM

## 2015-05-05 DIAGNOSIS — C50912 Malignant neoplasm of unspecified site of left female breast: Secondary | ICD-10-CM

## 2015-05-05 DIAGNOSIS — R9431 Abnormal electrocardiogram [ECG] [EKG]: Secondary | ICD-10-CM | POA: Diagnosis not present

## 2015-05-05 DIAGNOSIS — I2699 Other pulmonary embolism without acute cor pulmonale: Secondary | ICD-10-CM

## 2015-05-05 DIAGNOSIS — E669 Obesity, unspecified: Secondary | ICD-10-CM

## 2015-05-05 DIAGNOSIS — Z01810 Encounter for preprocedural cardiovascular examination: Secondary | ICD-10-CM | POA: Insufficient documentation

## 2015-05-05 DIAGNOSIS — R9439 Abnormal result of other cardiovascular function study: Secondary | ICD-10-CM

## 2015-05-05 DIAGNOSIS — J439 Emphysema, unspecified: Secondary | ICD-10-CM

## 2015-05-05 SURGERY — BREAST LUMPECTOMY WITH SENTINEL LYMPH NODE BX
Anesthesia: Choice | Laterality: Left

## 2015-05-05 NOTE — Assessment & Plan Note (Signed)
Recent CT scan of the chest showing resolution of her PE

## 2015-05-05 NOTE — Telephone Encounter (Signed)
Notified [name] with [facility] surgical Heidi [name] is cleared from a cardiac standpoint per Dr. Rockey Situ to have her breast lumpectomy.

## 2015-05-05 NOTE — Telephone Encounter (Signed)
Received cardiac clearance request from [facility] for pt to proceed w/ left partial mastectomy & biopsy on 05/05/15. Per Christell Faith, PA, pt needs ov & ECHO before we can provide clearance.  Pt sched to see Dr. Rockey Situ today @ 11:20.

## 2015-05-05 NOTE — Assessment & Plan Note (Signed)
Blood pressure is well controlled on today's visit. No changes made to the medications. 

## 2015-05-05 NOTE — Progress Notes (Signed)
Patient ID: Heidi [name], female    DOB: 11-12-1940, 74 y.o.   MRN: [id number]  HPI Comments: Heidi [name] is a pleasant 74 year-old woman with history of breast cancer, followed by Dr. Ma Hillock previously seen for shortness of breath who follows up today for abnormal EKG. Prior pulmonary embolism, completed 2-3 months of anticoagulation. She reports that she went for her preoperative/anesthesia clearance prior to lumpectomy and node dissection. She was told her EKG was abnormal.  She otherwise feels well, has no complaints. Denies any significant shortness of breath, no leg edema, no chest pain symptoms. Chemotherapy has finished. After surgery she is scheduled for XRT Currently not on anticoagulation. Recent CT scan suggested no PE She's been taking Lasix every day, metoprolol, and Advair  EKG on today's visit shows normal sinus rhythm with rate 79 bpm, no significant ST or T-wave changes EKG dated 04/29/2015 showing normal sinus rhythm, rate 71 bpm, PVC noted. Finding of septal infarct likely artifact from lead placement.  Given today's EKG is benign, 04/29/2015 EKG also normal.   Other past medical history Prior  Stress test showed a defect in the anterior wall concerning for ischemia though there was GI artifact noted making the results challenging to read. Review of recent CT scans of the chest shows no significant coronary calcification suggesting stress test finding is likely artifact. There is mild aortic arch and descending aortic calcification only  She reports a long smoking history for 30 years, age 30 up to 89. She has completed numerous rounds of Adriamycin, finishing approximately 8 weeks ago, now on Taxol.   echocardiogram documenting ejection fraction 40-45% otherwise essentially a normal study Images reviewed by myself with EF estimated at 50-55%, some septal hypokinesis and in very select views, ejection fraction 40-45% secondary to septal hypokinesis Normal right ventricular  systolic pressures  CT scan of the chest shows normal heart size, no pericardial effusion, scattered mild atherosclerosis of the aorta and great vessels  Allergies  Allergen Reactions  . Diphenhydramine Other (See Comments)    pt states makes legs very restless and ache  . Hydrochloric Acid     Other reaction(s): Unknown Uncoded Allergy. Allergen: HYDROCHLORLIC ACID.    Current Outpatient Prescriptions on File Prior to Visit  Medication Sig Dispense Refill  . diphenoxylate-atropine (LOMOTIL) 2.5-0.025 MG per tablet Take 1 tablet by mouth as needed.    . Fluticasone-Salmeterol (ADVAIR DISKUS) 250-50 MCG/DOSE AEPB Inhale 1 puff into the lungs 2 (two) times daily. GARGLE AND RINSE AFTER EACH USE. 60 each 3  . furosemide (LASIX) 40 MG tablet Take 1 tablet (40 mg total) by mouth daily as needed. (Patient taking differently: Take 40 mg by mouth daily. ) 30 tablet 3  . gabapentin (NEURONTIN) 100 MG capsule Take 1 capsule (100 mg total) by mouth 3 (three) times daily. 90 capsule 3  . ibuprofen (ADVIL,MOTRIN) 200 MG tablet Take 200 mg by mouth every 6 (six) hours as needed.    Marland Kitchen losartan (COZAAR) 50 MG tablet Take 50 mg by mouth daily.     . metoprolol succinate (TOPROL-XL) 50 MG 24 hr tablet Take 1 tablet (50 mg total) by mouth daily. Take with or immediately following a meal. 30 tablet 3  . potassium chloride SA (K-DUR,KLOR-CON) 20 MEQ tablet Take 1 tablet (20 mEq total) by mouth daily as needed. (Patient taking differently: Take 20 mEq by mouth daily. ) 30 tablet 3  . PROAIR HFA 108 (90 BASE) MCG/ACT inhaler Inhale 2 puffs into the  lungs every 6 (six) hours as needed.     . sertraline (ZOLOFT) 100 MG tablet Take 100 mg by mouth daily.    . traMADol (ULTRAM) 50 MG tablet Take 50 mg by mouth 2 (two) times daily.     Marland Kitchen zolpidem (AMBIEN) 10 MG tablet Take 1 tablet (10 mg total) by mouth at bedtime. 30 tablet 1   Current Facility-Administered Medications on File Prior to Visit  Medication Dose  Route Frequency Provider Last Rate Last Dose  . sodium chloride 0.9 % injection 10 mL  10 mL Intravenous PRN Leia Alf, MD      . sodium chloride 0.9 % injection 10 mL  10 mL Intracatheter PRN Leia Alf, MD   10 mL at 03/16/15 1110       Past Medical History  Diagnosis Date  . Hypertension   . Depression   . Hyperlipidemia   . Osteoarthritis   . Insomnia   . Cataracts, bilateral   . UTI (lower urinary tract infection)   . Anxiety   . COPD (chronic obstructive pulmonary disease)     mild COPD  . Shortness of breath dyspnea   . Anemia   . 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 Breast cancer     1990 right breast. 07/2014 left breast cancer    Past Surgical History  Procedure Laterality Date  . Mastectomy Right     lower 1990  . Eye surgery Bilateral     cataract extractions    Social History  reports that she quit smoking about 23 years ago. Her smoking use included Cigarettes. She has a 50 pack-year smoking history. She has never used smokeless tobacco. She reports that she does not drink alcohol or use illicit drugs.  Family History family history includes Bladder Cancer in her father; Dementia in her mother.   Review of Systems  Constitutional: Negative.   Respiratory:       Mild shortness of breath, improved  Cardiovascular: Negative.   Gastrointestinal: Negative.   Musculoskeletal: Negative.   Skin: Negative.   Neurological: Negative.   Hematological: Negative.   Psychiatric/Behavioral: Negative.   All other systems reviewed and are negative.   BP 120/78 mmHg  Pulse 79  Ht 5\' 8"  (1.727 m)  Wt 220 lb 12 oz (100.132 kg)  BMI 33.57 kg/m2  Physical Exam  Constitutional: She is oriented to person, place, and time. She appears well-developed and well-nourished.  Obese  HENT:  Head: Normocephalic.  Nose: Nose normal.  Mouth/Throat: Oropharynx is clear and moist.  Eyes: Conjunctivae are normal. Pupils are equal, round, and reactive to  light.  Neck: Normal range of motion. Neck supple. No JVD present.  Cardiovascular: Normal rate, regular rhythm, S1 normal, S2 normal, normal heart sounds and intact distal pulses.  Exam reveals no gallop and no friction rub.   No murmur heard. Pulmonary/Chest: Effort normal and breath sounds normal. No respiratory distress. She has no wheezes. She has no rales. She exhibits no tenderness.  Abdominal: Soft. Bowel sounds are normal. She exhibits no distension. There is no tenderness.  Musculoskeletal: Normal range of motion. She exhibits no edema or tenderness.  Lymphadenopathy:    She has no cervical adenopathy.  Neurological: She is alert and oriented to person, place, and time. Coordination normal.  Skin: Skin is warm and dry. No rash noted. No erythema.  Psychiatric: She has a normal mood and affect. Her behavior is normal. Judgment and thought content normal.    Assessment  and Plan  Nursing note and vitals reviewed.

## 2015-05-05 NOTE — Assessment & Plan Note (Signed)
We have encouraged continued exercise, careful diet management in an effort to lose weight. 

## 2015-05-05 NOTE — Assessment & Plan Note (Signed)
Shortness of breath has significantly improved after treatment of her PE. She is currently not on anticoagulation. Also doing well on Lasix daily and on Advair. Overall stable

## 2015-05-05 NOTE — Assessment & Plan Note (Signed)
Managed by Dr. Stevenson Clinch. Suggested she stay on her Advair

## 2015-05-05 NOTE — Assessment & Plan Note (Signed)
EKG is normal today, also normal on June 30. Prior finding of septal infarct is an error, likely from lead placement No further testing needed. She'll be acceptable risk for upcoming lumpectomy, node dissection No further medication changes needed. Recommended she stay on her metoprolol

## 2015-05-05 NOTE — Patient Instructions (Signed)
You are doing well. No medication changes were made.  Please call us if you have new issues that need to be addressed before your next appt.  Your physician wants you to follow-up in: 6 months.  You will receive a reminder letter in the mail two months in advance. If you don't receive a letter, please call our office to schedule the follow-up appointment.   

## 2015-05-05 NOTE — Telephone Encounter (Signed)
LMOM at [facility] that the patient is cleared for surgery per Dr. Rockey Situ.

## 2015-05-05 NOTE — Assessment & Plan Note (Signed)
Abnormal stress test likely attenuation artifact. Currently with no symptoms concerning for angina, normal EKG Review of her chest CT scan showing no significant coronary calcifications suggestive of CAD

## 2015-05-06 ENCOUNTER — Telehealth: Payer: Self-pay | Admitting: Surgery

## 2015-05-06 NOTE — Telephone Encounter (Signed)
Pt advised of pre op date/time and sx date. Sx: 05/07/15 with Dr Pat Patrick Pre op: 04/29/15 @ 8:15am.   Heidi [name] has been obtained for injection.  Pt advised to arrive at [facility] @ 8:00 05/07/15.

## 2015-05-06 NOTE — Pre-Procedure Instructions (Signed)
Cardiac clearance received from Dr. Rockey Situ

## 2015-05-07 ENCOUNTER — Ambulatory Visit
Admission: RE | Admit: 2015-05-07 | Discharge: 2015-05-07 | Disposition: A | Discharge status: Home / Self Care | Payer: Medicare Other | Source: Ambulatory Visit | Attending: Surgery | Admitting: Surgery

## 2015-05-07 ENCOUNTER — Other Ambulatory Visit: Payer: Self-pay | Admitting: Surgery

## 2015-05-07 ENCOUNTER — Ambulatory Visit: Payer: Medicare Other | Admitting: Anesthesiology

## 2015-05-07 ENCOUNTER — Encounter: Payer: Self-pay | Admitting: *Deleted

## 2015-05-07 ENCOUNTER — Encounter
Admission: RE | Disposition: A | Discharge status: Home / Self Care | Payer: Self-pay | Source: Ambulatory Visit | Attending: Surgery

## 2015-05-07 DIAGNOSIS — F419 Anxiety disorder, unspecified: Secondary | ICD-10-CM | POA: Diagnosis not present

## 2015-05-07 DIAGNOSIS — Z818 Family history of other mental and behavioral disorders: Secondary | ICD-10-CM | POA: Insufficient documentation

## 2015-05-07 DIAGNOSIS — C50912 Malignant neoplasm of unspecified site of left female breast: Secondary | ICD-10-CM

## 2015-05-07 DIAGNOSIS — E785 Hyperlipidemia, unspecified: Secondary | ICD-10-CM | POA: Diagnosis not present

## 2015-05-07 DIAGNOSIS — R0600 Dyspnea, unspecified: Secondary | ICD-10-CM | POA: Insufficient documentation

## 2015-05-07 DIAGNOSIS — G4700 Insomnia, unspecified: Secondary | ICD-10-CM | POA: Diagnosis not present

## 2015-05-07 DIAGNOSIS — Z9842 Cataract extraction status, left eye: Secondary | ICD-10-CM | POA: Insufficient documentation

## 2015-05-07 DIAGNOSIS — D649 Anemia, unspecified: Secondary | ICD-10-CM | POA: Insufficient documentation

## 2015-05-07 DIAGNOSIS — T451X5A Adverse effect of antineoplastic and immunosuppressive drugs, initial encounter: Secondary | ICD-10-CM | POA: Diagnosis not present

## 2015-05-07 DIAGNOSIS — F329 Major depressive disorder, single episode, unspecified: Secondary | ICD-10-CM | POA: Insufficient documentation

## 2015-05-07 DIAGNOSIS — Z79899 Other long term (current) drug therapy: Secondary | ICD-10-CM | POA: Insufficient documentation

## 2015-05-07 DIAGNOSIS — Z9221 Personal history of antineoplastic chemotherapy: Secondary | ICD-10-CM | POA: Diagnosis not present

## 2015-05-07 DIAGNOSIS — G629 Polyneuropathy, unspecified: Secondary | ICD-10-CM | POA: Diagnosis not present

## 2015-05-07 DIAGNOSIS — M1990 Unspecified osteoarthritis, unspecified site: Secondary | ICD-10-CM | POA: Diagnosis not present

## 2015-05-07 DIAGNOSIS — Z9841 Cataract extraction status, right eye: Secondary | ICD-10-CM | POA: Diagnosis not present

## 2015-05-07 DIAGNOSIS — C779 Secondary and unspecified malignant neoplasm of lymph node, unspecified: Secondary | ICD-10-CM | POA: Diagnosis not present

## 2015-05-07 DIAGNOSIS — I10 Essential (primary) hypertension: Secondary | ICD-10-CM | POA: Insufficient documentation

## 2015-05-07 DIAGNOSIS — Z888 Allergy status to other drugs, medicaments and biological substances status: Secondary | ICD-10-CM | POA: Insufficient documentation

## 2015-05-07 DIAGNOSIS — Z8052 Family history of malignant neoplasm of bladder: Secondary | ICD-10-CM | POA: Diagnosis not present

## 2015-05-07 DIAGNOSIS — D0512 Intraductal carcinoma in situ of left breast: Secondary | ICD-10-CM | POA: Insufficient documentation

## 2015-05-07 DIAGNOSIS — J449 Chronic obstructive pulmonary disease, unspecified: Secondary | ICD-10-CM | POA: Insufficient documentation

## 2015-05-07 DIAGNOSIS — C50919 Malignant neoplasm of unspecified site of unspecified female breast: Secondary | ICD-10-CM | POA: Diagnosis present

## 2015-05-07 DIAGNOSIS — Z853 Personal history of malignant neoplasm of breast: Secondary | ICD-10-CM | POA: Diagnosis not present

## 2015-05-07 DIAGNOSIS — Z87891 Personal history of nicotine dependence: Secondary | ICD-10-CM | POA: Insufficient documentation

## 2015-05-07 HISTORY — PX: BREAST LUMPECTOMY WITH SENTINEL LYMPH NODE BIOPSY: SHX5597

## 2015-05-07 HISTORY — PX: BREAST BIOPSY: SHX20

## 2015-05-07 SURGERY — BREAST BIOPSY WITH NEEDLE LOCALIZATION
Anesthesia: General | Laterality: Left

## 2015-05-07 MED ORDER — ONDANSETRON HCL 4 MG/2ML IJ SOLN
INTRAMUSCULAR | Status: DC | PRN
  Administered 2015-05-07: 4 mg via INTRAVENOUS

## 2015-05-07 MED ORDER — FAMOTIDINE 20 MG PO TABS
ORAL_TABLET | ORAL | Status: AC
Start: 2015-05-07 — End: 2015-05-07
  Administered 2015-05-07: 20 mg via ORAL
  Filled 2015-05-07: qty 1

## 2015-05-07 MED ORDER — DEXAMETHASONE SODIUM PHOSPHATE 4 MG/ML IJ SOLN
INTRAMUSCULAR | Status: DC | PRN
  Administered 2015-05-07: 5 mg via INTRAVENOUS

## 2015-05-07 MED ORDER — METHYLENE BLUE 1 % INJ SOLN
INTRAMUSCULAR | Status: AC
  Filled 2015-05-07: qty 10

## 2015-05-07 MED ORDER — ONDANSETRON HCL 4 MG/2ML IJ SOLN
4.0000 mg | Freq: Once | INTRAMUSCULAR | Status: DC | PRN
Start: 2015-05-07 — End: 2015-05-07

## 2015-05-07 MED ORDER — PROPOFOL 10 MG/ML IV BOLUS
INTRAVENOUS | Status: DC | PRN
  Administered 2015-05-07: 50 mg via INTRAVENOUS
  Administered 2015-05-07: 150 mg via INTRAVENOUS

## 2015-05-07 MED ORDER — CEFAZOLIN SODIUM-DEXTROSE 2-3 GM-% IV SOLR
INTRAVENOUS | Status: AC
  Administered 2015-05-07: 2 g via INTRAVENOUS
  Filled 2015-05-07: qty 50

## 2015-05-07 MED ORDER — ENOXAPARIN SODIUM 40 MG/0.4ML ~~LOC~~ SOLN
40.0000 mg | Freq: Once | SUBCUTANEOUS | Status: AC
  Administered 2015-05-07: 40 mg via SUBCUTANEOUS
  Filled 2015-05-07: qty 0.4

## 2015-05-07 MED ORDER — MIDAZOLAM HCL 2 MG/2ML IJ SOLN
INTRAMUSCULAR | Status: DC | PRN
  Administered 2015-05-07: 2 mg via INTRAVENOUS

## 2015-05-07 MED ORDER — ACETAMINOPHEN 10 MG/ML IV SOLN
INTRAVENOUS | Status: AC
  Filled 2015-05-07: qty 100

## 2015-05-07 MED ORDER — LIDOCAINE HCL (CARDIAC) 20 MG/ML IV SOLN
INTRAVENOUS | Status: DC | PRN
  Administered 2015-05-07: 100 mg via INTRAVENOUS

## 2015-05-07 MED ORDER — FENTANYL CITRATE (PF) 100 MCG/2ML IJ SOLN
INTRAMUSCULAR | Status: AC
  Filled 2015-05-07: qty 2

## 2015-05-07 MED ORDER — LIDOCAINE HCL (PF) 1 % IJ SOLN
INTRAMUSCULAR | Status: AC
  Filled 2015-05-07: qty 30

## 2015-05-07 MED ORDER — FAMOTIDINE 20 MG PO TABS
20.0000 mg | ORAL_TABLET | Freq: Once | ORAL | Status: AC
  Administered 2015-05-07: 20 mg via ORAL

## 2015-05-07 MED ORDER — HYDROCODONE-ACETAMINOPHEN 5-325 MG PO TABS: 1.0000 | ORAL_TABLET | Freq: Four times a day (QID) | ORAL | Status: DC | PRN

## 2015-05-07 MED ORDER — KETOROLAC TROMETHAMINE 30 MG/ML IJ SOLN
INTRAMUSCULAR | Status: DC | PRN
  Administered 2015-05-07: 30 mg via INTRAVENOUS

## 2015-05-07 MED ORDER — SODIUM BICARBONATE 4 % IV SOLN
INTRAVENOUS | Status: AC
  Filled 2015-05-07: qty 5

## 2015-05-07 MED ORDER — TECHNETIUM TC 99M SULFUR COLLOID
1.0700 | Freq: Once | INTRAVENOUS | Status: AC | PRN
  Administered 2015-05-07: 1.07 via INTRAVENOUS

## 2015-05-07 MED ORDER — FENTANYL CITRATE (PF) 100 MCG/2ML IJ SOLN
INTRAMUSCULAR | Status: DC | PRN
  Administered 2015-05-07: 50 ug via INTRAVENOUS
  Administered 2015-05-07: 75 ug via INTRAVENOUS
  Administered 2015-05-07: 25 ug via INTRAVENOUS

## 2015-05-07 MED ORDER — SODIUM CHLORIDE 0.9 % IJ SOLN
INTRAMUSCULAR | Status: AC
  Filled 2015-05-07: qty 10

## 2015-05-07 MED ORDER — HYDROMORPHONE HCL 1 MG/ML IJ SOLN
0.2500 mg | INTRAMUSCULAR | Status: DC | PRN
  Administered 2015-05-07: 0.25 mg via INTRAVENOUS
  Administered 2015-05-07: 0.5 mg via INTRAVENOUS
  Administered 2015-05-07: 0.25 mg via INTRAVENOUS

## 2015-05-07 MED ORDER — CEFAZOLIN SODIUM-DEXTROSE 2-3 GM-% IV SOLR
2.0000 g | INTRAVENOUS | Status: AC
  Administered 2015-05-07: 2 g via INTRAVENOUS

## 2015-05-07 MED ORDER — CHLORHEXIDINE GLUCONATE 4 % EX LIQD
1.0000 "application " | Freq: Once | CUTANEOUS | Status: AC
  Administered 2015-05-07: 1 via TOPICAL

## 2015-05-07 MED ORDER — ACETAMINOPHEN 10 MG/ML IV SOLN
INTRAVENOUS | Status: DC | PRN
  Administered 2015-05-07: 1000 mg via INTRAVENOUS

## 2015-05-07 MED ORDER — HYDROMORPHONE HCL 1 MG/ML IJ SOLN
INTRAMUSCULAR | Status: AC
  Administered 2015-05-07: 0.5 mg via INTRAVENOUS
  Filled 2015-05-07: qty 1

## 2015-05-07 MED ORDER — FENTANYL CITRATE (PF) 100 MCG/2ML IJ SOLN
25.0000 ug | INTRAMUSCULAR | Status: AC | PRN
  Administered 2015-05-07 (×6): 25 ug via INTRAVENOUS

## 2015-05-07 MED ORDER — PHENYLEPHRINE HCL 10 MG/ML IJ SOLN
INTRAMUSCULAR | Status: DC | PRN
  Administered 2015-05-07: 100 ug via INTRAVENOUS

## 2015-05-07 MED ORDER — GLYCOPYRROLATE 0.2 MG/ML IJ SOLN
INTRAMUSCULAR | Status: DC | PRN
  Administered 2015-05-07: 0.2 mg via INTRAVENOUS

## 2015-05-07 MED ORDER — LACTATED RINGERS IV SOLN
INTRAVENOUS | Status: DC
  Administered 2015-05-07: 10:00:00 via INTRAVENOUS

## 2015-05-07 SURGICAL SUPPLY — 45 items
BULB RESERV EVAC DRAIN JP 100C (MISCELLANEOUS) ×3 IMPLANT
CANISTER SUCT 1200ML W/VALVE (MISCELLANEOUS) ×3 IMPLANT
CHLORAPREP W/TINT 26ML (MISCELLANEOUS) ×3 IMPLANT
CLOSURE WOUND 1/4X4 (GAUZE/BANDAGES/DRESSINGS) ×1
CNTNR SPEC 2.5X3XGRAD LEK (MISCELLANEOUS) ×1
CONT SPEC 4OZ STER OR WHT (MISCELLANEOUS) ×2
CONTAINER SPEC 2.5X3XGRAD LEK (MISCELLANEOUS) ×1 IMPLANT
DEVICE DUBIN SPECIMEN MAMMOGRA (MISCELLANEOUS) ×3 IMPLANT
DRAIN CHANNEL JP 15F RND 16 (MISCELLANEOUS) ×3 IMPLANT
DRAPE LAPAROTOMY 100X77 ABD (DRAPES) IMPLANT
DRAPE LAPAROTOMY TRNSV 106X77 (MISCELLANEOUS) ×3 IMPLANT
ELECT CAUTERY NEEDLE TIP 1.0 (MISCELLANEOUS) ×3
ELECTRODE CAUTERY NEDL TIP 1.0 (MISCELLANEOUS) ×1 IMPLANT
GAUZE SPONGE 4X4 12PLY STRL (GAUZE/BANDAGES/DRESSINGS) ×3 IMPLANT
GLOVE BIO SURGEON STRL SZ7.5 (GLOVE) ×6 IMPLANT
GLOVE INDICATOR 8.0 STRL GRN (GLOVE) ×6 IMPLANT
GOWN STRL REUS W/ TWL LRG LVL3 (GOWN DISPOSABLE) ×2 IMPLANT
GOWN STRL REUS W/TWL LRG LVL3 (GOWN DISPOSABLE) ×4
JACKSON PRATT 7MM (INSTRUMENTS) IMPLANT
KIT RM TURNOVER STRD PROC AR (KITS) ×3 IMPLANT
LABEL OR SOLS (LABEL) IMPLANT
MARGIN MAP 10MM (MISCELLANEOUS) ×3 IMPLANT
NDL SAFETY 22GX1.5 (NEEDLE) ×3 IMPLANT
NDL SAFETY 25GX1.5 (NEEDLE) ×6 IMPLANT
PACK BASIN MINOR ARMC (MISCELLANEOUS) ×3 IMPLANT
PAD GROUND ADULT SPLIT (MISCELLANEOUS) ×3 IMPLANT
SLEVE PROBE SENORX GAMMA FIND (MISCELLANEOUS) ×3 IMPLANT
STRIP CLOSURE SKIN 1/4X4 (GAUZE/BANDAGES/DRESSINGS) ×2 IMPLANT
SUT ETHILON 3-0 FS-10 30 BLK (SUTURE)
SUT ETHILON 4-0 (SUTURE) ×2
SUT ETHILON 4-0 FS2 18XMFL BLK (SUTURE) ×1
SUT MON AB 3-0 SH 27 (SUTURE) ×3 IMPLANT
SUT SILK 3-0 (SUTURE) ×2
SUT SILK 3-0 SH-1 18XCR BRD (SUTURE) ×1
SUT VIC AB 3-0 54X BRD REEL (SUTURE) ×1 IMPLANT
SUT VIC AB 3-0 BRD 54 (SUTURE) ×2
SUT VIC AB 3-0 SH 27 (SUTURE) ×4
SUT VIC AB 3-0 SH 27X BRD (SUTURE) ×2 IMPLANT
SUT VIC AB 4-0 FS2 27 (SUTURE) ×3 IMPLANT
SUTURE EHLN 3-0 FS-10 30 BLK (SUTURE) IMPLANT
SUTURE ETHLN 4-0 FS2 18XMF BLK (SUTURE) ×1 IMPLANT
SUTURE SILK 3-0 SH-1 18XCR BRD (SUTURE) ×1 IMPLANT
SYR BULB EAR ULCER 3OZ GRN STR (SYRINGE) IMPLANT
SYRINGE 10CC LL (SYRINGE) ×3 IMPLANT
WATER STERILE IRR 1000ML POUR (IV SOLUTION) ×3 IMPLANT

## 2015-05-07 NOTE — Anesthesia Preprocedure Evaluation (Addendum)
Anesthesia Evaluation  Patient identified by MRN, date of birth, ID band Patient awake    Reviewed: Allergy & Precautions, NPO status , Patient's Chart, lab work & pertinent test results, reviewed documented beta blocker date and time   History of Anesthesia Complications Negative for: history of anesthetic complications  Airway Mallampati: II  TM Distance: >3 FB Neck ROM: Full    Dental  (+)    Pulmonary COPD COPD inhaler, former smoker,  breath sounds clear to auscultation  Pulmonary exam normal       Cardiovascular hypertension, Pt. on medications and Pt. on home beta blockers Normal cardiovascular examRhythm:Regular Rate:Normal     Neuro/Psych Anxiety Depression negative neurological ROS  negative psych ROS   GI/Hepatic negative GI ROS, Neg liver ROS,   Endo/Other  negative endocrine ROS  Renal/GU negative Renal ROS  negative genitourinary   Musculoskeletal  (+) Arthritis -,   Abdominal   Peds negative pediatric ROS (+)  Hematology  (+) anemia ,   Anesthesia Other Findings   Reproductive/Obstetrics negative OB ROS                           Anesthesia Physical Anesthesia Plan  ASA: III  Anesthesia Plan: General   Post-op Pain Management:    Induction: Intravenous  Airway Management Planned: LMA  Additional Equipment:   Intra-op Plan:   Post-operative Plan: Extubation in OR  Informed Consent: I have reviewed the patients History and Physical, chart, labs and discussed the procedure including the risks, benefits and alternatives for the proposed anesthesia with the patient or authorized representative who has indicated his/her understanding and acceptance.   Dental advisory given  Plan Discussed with: CRNA and Surgeon  Anesthesia Plan Comments:         Anesthesia Quick Evaluation

## 2015-05-07 NOTE — Anesthesia Postprocedure Evaluation (Signed)
  Anesthesia Post-op Note  Patient: Heidi [name]  Procedure(s) Performed: Procedure(s): BREAST BIOPSY WITH NEEDLE LOCALIZATION (Left) PARTIAL MASTECTOMY WITH SENTINEL LYMPH NODE BX (Left)  Anesthesia type:General  Patient location: PACU  Post pain: Pain level controlled  Post assessment: Post-op Vital signs reviewed, Patient's Cardiovascular Status Stable, Respiratory Function Stable, Patent Airway and No signs of Nausea or vomiting  Post vital signs: Reviewed and stable  Last Vitals:  Filed Vitals:   05/07/15 1215  BP:   Pulse: 75  Temp:   Resp: 13    Level of consciousness: awake, alert  and patient cooperative  Complications: No apparent anesthesia complications

## 2015-05-07 NOTE — Progress Notes (Signed)

## 2015-05-07 NOTE — Op Note (Signed)
05/07/2015  11:46 AM  PATIENT:  Heidi [name]  74 y.o. female  PRE-OPERATIVE DIAGNOSIS:  BREAST CANCER  POST-OPERATIVE DIAGNOSIS:  BREAST CANCER  PROCEDURE:  Procedure(s): BREAST BIOPSY WITH NEEDLE LOCALIZATION (Left) PARTIAL MASTECTOMY WITH SENTINEL LYMPH NODE BX (Left)  SURGEON:  Surgeon(s) and Role:    * Dia Crawford III, MD - Primary   ASSISTANTS: none   ANESTHESIA:   general  EBL:      DRAINS: (1) Jackson-Pratt drain(s) with closed bulb suction in the Partial mastectomy site   LOCAL MEDICATIONS USED:  NONE   DISPOSITION OF SPECIMEN:  PATHOLOGY   DICTATION: .Dragon Dictation with the patient supine position infection appropriate general anesthesia the patient's left breast prepped and draped sterile towels. An elliptical incision was made around the site of the wire placement and carried down to the chest wall. The wire was not identified. The specimen was swept off the chest wall and sent for mammographic evaluation. The study did demonstrate the presence of the wire and the clip. The specimens and sent to pathology which demonstrated the clip no obvious mass was identified. Attention was then turned to the axilla where the axillary artery was interrogated with the neoprobe incision was made transversely and carried down through the subcutaneous taste tissue using Bovie cautery. A cluster of lymph nodes with radioactive tracer were identified. The largest node was marked with a stitch and sent to pathology. Several other small lymph nodes were identified in the same area but did not have any radioactive material noted. The area was irrigated. Subcutaneous space and skin were closed with 4-0 Vicryl running subcuticular suture.  The partial mastectomy site was then closed over a 15 [place] Blake drain using 3-0 Monocryl. Benzoin Steri-Strips were applied to  PLAN OF CARE: Discharge to home after PACU  PATIENT DISPOSITION:  PACU - hemodynamically stable.   Dia Crawford III, MD

## 2015-05-07 NOTE — Discharge Instructions (Signed)
AMBULATORY SURGERY  °DISCHARGE INSTRUCTIONS ° ° °1) The drugs that you were given will stay in your system until tomorrow so for the next 24 hours you should not: ° °A) Drive an automobile °B) Make any legal decisions °C) Drink any alcoholic beverage ° ° °2) You may resume regular meals tomorrow.  Today it is better to start with liquids and gradually work up to solid foods. ° °You may eat anything you prefer, but it is better to start with liquids, then soup and crackers, and gradually work up to solid foods. ° ° °3) Please notify your doctor immediately if you have any unusual bleeding, trouble breathing, redness and pain at the surgery site, drainage, fever, or pain not relieved by medication. ° ° ° °4) Additional Instructions: ° ° ° ° ° ° ° °Please contact your physician with any problems or Same Day Surgery at [phone], Monday through Friday 6 am to 4 pm, or [place] [facility] [place] [facility] number at [phone]. °

## 2015-05-07 NOTE — Transfer of Care (Signed)
Immediate Anesthesia Transfer of Care Note  Patient: Heidi [name]  Procedure(s) Performed: Procedure(s): BREAST BIOPSY WITH NEEDLE LOCALIZATION (Left) PARTIAL MASTECTOMY WITH SENTINEL LYMPH NODE BX (Left)  Patient Location: PACU  Anesthesia Type:General  Level of Consciousness: awake, alert  and oriented  Airway & Oxygen Therapy: Patient Spontanous Breathing and Patient connected to face mask oxygen  Post-op Assessment: Report given to RN and Post -op Vital signs reviewed and stable  Post vital signs: Reviewed and stable  Last Vitals:  Filed Vitals:   05/07/15 1152  BP: 131/70  Pulse: 90  Temp: 37 C  Resp: 18    Complications: No apparent anesthesia complications

## 2015-05-07 NOTE — Anesthesia Procedure Notes (Signed)
Procedure Name: LMA Insertion Date/Time: 05/07/2015 10:26 AM Performed by: Doreen Salvage Pre-anesthesia Checklist: Patient identified, Patient being monitored, Timeout performed, Emergency Drugs available and Suction available Patient Re-evaluated:Patient Re-evaluated prior to inductionOxygen Delivery Method: Circle system utilized Preoxygenation: Pre-oxygenation with 100% oxygen Intubation Type: IV induction Ventilation: Mask ventilation without difficulty LMA: LMA inserted LMA Size: 4.5 Tube type: Oral Number of attempts: 1 Placement Confirmation: positive ETCO2 and breath sounds checked- equal and bilateral Tube secured with: Tape Dental Injury: Teeth and Oropharynx as per pre-operative assessment

## 2015-05-07 NOTE — Addendum Note (Signed)
Addendum  created 05/07/15 1238 by Doreen Salvage, CRNA   Modules edited: Charges VN

## 2015-05-11 LAB — SURGICAL PATHOLOGY

## 2015-05-13 ENCOUNTER — Telehealth: Payer: Self-pay | Admitting: *Deleted

## 2015-05-13 ENCOUNTER — Telehealth: Payer: Self-pay | Admitting: Internal Medicine

## 2015-05-13 ENCOUNTER — Encounter: Payer: Self-pay | Admitting: Surgery

## 2015-05-13 ENCOUNTER — Ambulatory Visit (INDEPENDENT_AMBULATORY_CARE_PROVIDER_SITE_OTHER): Payer: Self-pay | Admitting: Surgery

## 2015-05-13 VITALS — BP 123/76 | HR 87 | Temp 98.4°F | Ht 68.0 in | Wt 223.0 lb

## 2015-05-13 DIAGNOSIS — C50912 Malignant neoplasm of unspecified site of left female breast: Secondary | ICD-10-CM

## 2015-05-13 DIAGNOSIS — Z09 Encounter for follow-up examination after completed treatment for conditions other than malignant neoplasm: Secondary | ICD-10-CM

## 2015-05-13 NOTE — Telephone Encounter (Signed)
Will route message to VM to make him aware.

## 2015-05-13 NOTE — Patient Instructions (Addendum)
Follow-up with Dr. Ma Hillock so that he can speak with you in regards to your plan of care.  We would also recommend to follow-up with Dr. Pat Patrick in 2 weeks.  Your drain has been removed today, this may continue to drain over the next 48 hours. Please change dressing with guaze provided and keep area clean and dry. If this drains an excessive amount please call our office so that a nurse can evaluate this problem.

## 2015-05-13 NOTE — Telephone Encounter (Signed)
DIAGNOSIS:  A. LEFT BREAST; NEEDLE LOCALIZED PARTIAL MASTECTOMY:  - INVASIVE MAMMARY CARCINOMA OF NO SPECIAL TYPE, 15 MM.  - DUCTAL CARCINOMA IN SITU, NUCLEAR GRADE 1, CRIBRIFORM TYPE.  - BIOPSY SITE CHANGES, MARKER CLIP PRESENT.  - MARGINS OF EXCISION ARE NEGATIVE, CLOSEST MARGIN TO INVASIVE CARCINOMA  IS INFERIOR.  - SEE CANCER CASE SUMMARY BELOW.   B. SENTINEL LYMPH NODE; EXCISION:  - THREE OF FOUR SENTINEL LYMPH NODES INVOLVED BY METASTASES (3/4),  LARGEST TUMOR FOCUS MEASURES 7 MM.   C. SENTINEL LYMPH NODES; EXCISION:  - NO TUMOR SEEN IN TWO SENTINEL LYMPH NODES (0/2).

## 2015-05-13 NOTE — Progress Notes (Signed)
Surgery Clinic Note  S: Min pain, approx 7 cc of drainage from drain, no incision drainage O: Blood pressure 123/76, pulse 87, temperature 98.4 F (36.9 C), temperature source Oral, height 5\' 8"  (1.727 m), weight 223 lb (101.152 kg). GEN: NAD/A&Ox3 BREAST: both incisions c/d/i with steristrips, JP serous  Path: Partial mastectomy with negative margins, 3/6 SLN positive, 2 with macromets  A/P 74 yo s/p neoadjuvant chemoradiation, s/p partial mastectomy with + SLN x 3.  Drain removed.  Will return in 2 weeks with Dr. Pat Patrick.  Will send path to Dr. Ma Hillock to have reviewed at tumor board.

## 2015-05-26 ENCOUNTER — Telehealth: Payer: Self-pay | Admitting: *Deleted

## 2015-05-26 MED ORDER — ZOLPIDEM TARTRATE 10 MG PO TABS: 10.0000 mg | ORAL_TABLET | Freq: Every day | ORAL | Status: DC

## 2015-05-26 NOTE — Telephone Encounter (Signed)
Called in.

## 2015-05-27 ENCOUNTER — Ambulatory Visit (INDEPENDENT_AMBULATORY_CARE_PROVIDER_SITE_OTHER): Payer: Self-pay | Admitting: Surgery

## 2015-05-27 ENCOUNTER — Encounter: Payer: Self-pay | Admitting: Surgery

## 2015-05-27 ENCOUNTER — Ambulatory Visit: Payer: Medicare Other

## 2015-05-27 VITALS — BP 131/65 | HR 79 | Temp 98.5°F | Ht 68.0 in | Wt 218.0 lb

## 2015-05-27 DIAGNOSIS — C50912 Malignant neoplasm of unspecified site of left female breast: Secondary | ICD-10-CM

## 2015-05-27 NOTE — Progress Notes (Signed)
Outpatient Surgical Follow Up  05/27/2015  Heidi [name] is an 74 y.o. female. Several weeks out now from a left partial mastectomy with sentinel lymph node biopsy.  Chief Complaint  Patient presents with  . Routine Post Op    partial mastectomy left side    HPI: She underwent a partial mastectomy and sentinel lymph node biopsy. The pathology demonstrated several positive lymph nodes. She is preparing for radiation therapy.  She is doing well overall with no major complaints at the present time. She does have multiple questions.  Past Medical History  Diagnosis Date  . Hypertension   . Depression   . Hyperlipidemia   . Osteoarthritis   . Insomnia   . Cataracts, bilateral   . UTI (lower urinary tract infection)   . Anxiety   . COPD (chronic obstructive pulmonary disease)     mild COPD  . Shortness of breath dyspnea   . Anemia   . 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 Breast cancer     1990 right breast. 07/2014 left breast cancer    Past Surgical History  Procedure Laterality Date  . Mastectomy Right     lower 1990  . Eye surgery Bilateral     cataract extractions  . Breast biopsy Left 05/07/2015    Procedure: BREAST BIOPSY WITH NEEDLE LOCALIZATION;  Surgeon: Dia Crawford III, MD;  Location: [facility] ORS;  Service: General;  Laterality: Left;  . Breast lumpectomy with sentinel lymph node biopsy Left 05/07/2015    Procedure: PARTIAL MASTECTOMY WITH SENTINEL LYMPH NODE BX;  Surgeon: Dia Crawford III, MD;  Location: [facility] ORS;  Service: General;  Laterality: Left;    Family History  Problem Relation Age of Onset  . Dementia Mother   . Bladder Cancer Father     Social History:  reports that she quit smoking about 23 years ago. Her smoking use included Cigarettes. She has a 50 pack-year smoking history. She has never used smokeless tobacco. She reports that she does not drink alcohol or use illicit drugs.  Allergies:  Allergies  Allergen Reactions  . Diphenhydramine Other  (See Comments)    pt states makes legs very restless and ache  . Hydrochloric Acid Hives    Other reaction(s): Blisters Uncoded Allergy. Allergen: HYDROCHLORLIC ACID.    Medications reviewed.    ROS    BP 131/65 mmHg  Pulse 79  Temp(Src) 98.5 F (36.9 C) (Oral)  Ht 5\' 8"  (1.727 m)  Wt 98.884 kg (218 lb)  BMI 33.15 kg/m2  Physical Exam Her wounds look good. The Steri-Strips were removed. There is no evidence for any infection in her wounds. To be healing well.    No results found for this or any previous visit (from the past 48 hour(s)). No results found.  Assessment/Plan:  1. Breast cancer, left We will return her to the cancer center for consideration of radiation and chemotherapy. We will follow her in our office as necessary with regard to her wound issues. We talked about the pathology at length. She is doing very well at this point.     Dia Crawford III  05/27/2015,negative

## 2015-06-01 ENCOUNTER — Ambulatory Visit: Payer: Self-pay | Admitting: Internal Medicine

## 2015-06-01 ENCOUNTER — Other Ambulatory Visit: Payer: Self-pay

## 2015-06-01 ENCOUNTER — Ambulatory Visit: Payer: Medicare Other | Admitting: Radiation Oncology

## 2015-06-01 ENCOUNTER — Inpatient Hospital Stay: Payer: Medicare Other

## 2015-06-04 ENCOUNTER — Ambulatory Visit: Payer: Medicare Other | Admitting: Radiation Oncology

## 2015-06-15 ENCOUNTER — Inpatient Hospital Stay: Payer: Medicare Other | Admitting: Internal Medicine

## 2015-06-15 ENCOUNTER — Telehealth: Payer: Self-pay | Admitting: *Deleted

## 2015-06-15 ENCOUNTER — Inpatient Hospital Stay: Payer: Medicare Other

## 2015-06-15 ENCOUNTER — Ambulatory Visit: Payer: Medicare Other | Admitting: Radiation Oncology

## 2015-06-15 NOTE — Telephone Encounter (Signed)
Pt call to states that she has an appt today with Dr Ma Hillock and Baruch Gouty , She was diagnosed yesterday with C Diff and so she wants to know if she should come today.  I spoke with Dr Ma Hillock who said to move her appt out 1 week She has been rescheduled to 8/24 @12 :45 for lab, 1:00 for port flush, 1:30 Dr Ma Hillock, 2:00 Dr Baruch Gouty.  I called pt back with the appt changes and she thnaked me for changing them and she repeated the appt date and time back to me

## 2015-06-21 ENCOUNTER — Telehealth: Payer: Self-pay | Admitting: Hematology and Oncology

## 2015-06-21 NOTE — Telephone Encounter (Signed)
Re:  C diff  Patient called to say that she has had diarrhea x 6 weeks.  Diagnosed by her PCP with C difficile on Monday, 06/14/2015, and started on antibiotics and Lomotil.  She is running out of her Lomotil.  She requested a refill.  The patient stated that she completed neoadjuvant chemotherapy followed by surgery with plan for radiation.  I requested that she call her PCP who diagnosed and prescribed treatment for the C diff as I do not feel comfortable treating C diff with Lomotil.  Lequita Asal, MD

## 2015-06-23 ENCOUNTER — Encounter: Payer: Self-pay | Admitting: Radiation Oncology

## 2015-06-23 ENCOUNTER — Inpatient Hospital Stay: Payer: Medicare Other | Attending: Internal Medicine

## 2015-06-23 ENCOUNTER — Inpatient Hospital Stay (HOSPITAL_BASED_OUTPATIENT_CLINIC_OR_DEPARTMENT_OTHER): Payer: Medicare Other | Admitting: Internal Medicine

## 2015-06-23 ENCOUNTER — Ambulatory Visit
Admission: RE | Admit: 2015-06-23 | Discharge: 2015-06-23 | Disposition: A | Discharge status: Home / Self Care | Payer: Medicare Other | Source: Ambulatory Visit | Attending: Radiation Oncology | Admitting: Radiation Oncology

## 2015-06-23 ENCOUNTER — Inpatient Hospital Stay: Payer: Medicare Other

## 2015-06-23 VITALS — BP 146/84 | HR 93 | Temp 96.9°F | Resp 18 | Ht 68.0 in | Wt 208.8 lb

## 2015-06-23 DIAGNOSIS — E785 Hyperlipidemia, unspecified: Secondary | ICD-10-CM | POA: Diagnosis not present

## 2015-06-23 DIAGNOSIS — Z9221 Personal history of antineoplastic chemotherapy: Secondary | ICD-10-CM | POA: Insufficient documentation

## 2015-06-23 DIAGNOSIS — G620 Drug-induced polyneuropathy: Secondary | ICD-10-CM | POA: Diagnosis not present

## 2015-06-23 DIAGNOSIS — Z87891 Personal history of nicotine dependence: Secondary | ICD-10-CM | POA: Insufficient documentation

## 2015-06-23 DIAGNOSIS — J449 Chronic obstructive pulmonary disease, unspecified: Secondary | ICD-10-CM | POA: Diagnosis not present

## 2015-06-23 DIAGNOSIS — C773 Secondary and unspecified malignant neoplasm of axilla and upper limb lymph nodes: Secondary | ICD-10-CM | POA: Insufficient documentation

## 2015-06-23 DIAGNOSIS — Z9011 Acquired absence of right breast and nipple: Secondary | ICD-10-CM | POA: Insufficient documentation

## 2015-06-23 DIAGNOSIS — I10 Essential (primary) hypertension: Secondary | ICD-10-CM | POA: Insufficient documentation

## 2015-06-23 DIAGNOSIS — C50212 Malignant neoplasm of upper-inner quadrant of left female breast: Secondary | ICD-10-CM | POA: Diagnosis not present

## 2015-06-23 DIAGNOSIS — Z79899 Other long term (current) drug therapy: Secondary | ICD-10-CM | POA: Diagnosis not present

## 2015-06-23 DIAGNOSIS — C50912 Malignant neoplasm of unspecified site of left female breast: Secondary | ICD-10-CM

## 2015-06-23 DIAGNOSIS — M1990 Unspecified osteoarthritis, unspecified site: Secondary | ICD-10-CM | POA: Insufficient documentation

## 2015-06-23 DIAGNOSIS — I2699 Other pulmonary embolism without acute cor pulmonale: Secondary | ICD-10-CM | POA: Insufficient documentation

## 2015-06-23 DIAGNOSIS — Z853 Personal history of malignant neoplasm of breast: Secondary | ICD-10-CM | POA: Diagnosis not present

## 2015-06-23 DIAGNOSIS — Z170 Estrogen receptor positive status [ER+]: Secondary | ICD-10-CM | POA: Insufficient documentation

## 2015-06-23 DIAGNOSIS — Z510 Encounter for antineoplastic radiation therapy: Secondary | ICD-10-CM | POA: Insufficient documentation

## 2015-06-23 DIAGNOSIS — Z7901 Long term (current) use of anticoagulants: Secondary | ICD-10-CM | POA: Insufficient documentation

## 2015-06-23 DIAGNOSIS — T451X5S Adverse effect of antineoplastic and immunosuppressive drugs, sequela: Secondary | ICD-10-CM | POA: Diagnosis not present

## 2015-06-23 LAB — CBC WITH DIFFERENTIAL/PLATELET
BASOS ABS: 0 10*3/uL (ref 0–0.1)
Basophils Relative: 1 %
Eosinophils Absolute: 0.2 10*3/uL (ref 0–0.7)
Eosinophils Relative: 3 %
HEMATOCRIT: 33.9 % — AB (ref 35.0–47.0)
Hemoglobin: 11.3 g/dL — ABNORMAL LOW (ref 12.0–16.0)
Lymphocytes Relative: 22 %
Lymphs Abs: 1.4 10*3/uL (ref 1.0–3.6)
MCH: 30.9 pg (ref 26.0–34.0)
MCHC: 33.2 g/dL (ref 32.0–36.0)
MCV: 93.2 fL (ref 80.0–100.0)
MONO ABS: 0.6 10*3/uL (ref 0.2–0.9)
Monocytes Relative: 9 %
Neutro Abs: 4.4 10*3/uL (ref 1.4–6.5)
Neutrophils Relative %: 65 %
Platelets: 414 10*3/uL (ref 150–440)
RBC: 3.64 MIL/uL — AB (ref 3.80–5.20)
RDW: 14.9 % — AB (ref 11.5–14.5)
WBC: 6.6 10*3/uL (ref 3.6–11.0)

## 2015-06-23 LAB — HEPATIC FUNCTION PANEL
ALT: 27 U/L (ref 14–54)
AST: 27 U/L (ref 15–41)
Albumin: 3.5 g/dL (ref 3.5–5.0)
Alkaline Phosphatase: 55 U/L (ref 38–126)
Bilirubin, Direct: <0.1 mg/dL — ABNORMAL LOW (ref 0.1–0.5)
TOTAL PROTEIN: 6.3 g/dL — AB (ref 6.5–8.1)
Total Bilirubin: 0.3 mg/dL (ref 0.3–1.2)

## 2015-06-23 LAB — CREATININE, SERUM
Creatinine, Ser: 0.68 mg/dL (ref 0.44–1.00)
GFR calc Af Amer: >60 mL/min (ref >60)
GFR calc non Af Amer: >60 mL/min (ref >60)

## 2015-06-23 MED ORDER — SODIUM CHLORIDE 0.9 % IJ SOLN
10.0000 mL | INTRAMUSCULAR | Status: DC | PRN
  Administered 2015-06-23: 10 mL via INTRAVENOUS
  Filled 2015-06-23: qty 10

## 2015-06-23 MED ORDER — HEPARIN SOD (PORK) LOCK FLUSH 100 UNIT/ML IV SOLN
INTRAVENOUS | Status: AC
  Filled 2015-06-23: qty 5

## 2015-06-23 MED ORDER — HEPARIN SOD (PORK) LOCK FLUSH 100 UNIT/ML IV SOLN
500.0000 [IU] | Freq: Once | INTRAVENOUS | Status: AC
  Administered 2015-06-23: 500 [IU] via INTRAVENOUS

## 2015-06-23 NOTE — Progress Notes (Signed)
Patient is here for follow-up and to see Dr. Baruch Gouty to start radiation treatments. Patient was dx'ed with C-Diff and was prescribed Flagyl. She states that she is feeling much better and thinks that she is almost over it.

## 2015-06-23 NOTE — Consult Note (Signed)
Except an outstanding is perfect of Radiation Oncology NEW PATIENT EVALUATION  Name: Heidi [name]  MRN: [id number]  Date:   06/23/2015     DOB: 17-Dec-1940   This 74 y.o. female patient presents to the clinic for initial evaluation of breast cancer at least stage IIa (T1 N1 M0) status post new adjuvant chemotherapy than wide local excision and sentinel node biopsy for ER/PR positive HER-2/neu negative invasive mammary carcinoma.  REFERRING PHYSICIAN: [name], MD  CHIEF COMPLAINT: No chief complaint on file.   DIAGNOSIS: The encounter diagnosis was Malignant neoplasm of female breast, left.   PREVIOUS INVESTIGATIONS:  Mammograms and ultrasound reviewed Surgical pathology reports reviewed Clinical notes reviewed  HPI: Patient is a pleasant 74 year old female who presented with a self discovered mass in her left breast. Went on to have ultrasound mammogram September 2015 showing a 1 x 1 cm mass in the left upper inner quadrant with also multiple thickened axillary lymph nodes seen. Ultrasound-guided biopsy of the left breast and left axillary lymph node showed invasive mammary carcinoma in both biopsies tumor was ER/PR positive HER-2/neu 2+ but negative on fish. She went on to have Cytoxan and Adriamycin 4 and then weekly Taxol. Went on to have residual invasive mammary carcinoma in the left breast measuring 1.5 cm with margins clear at 7 mm. 6 lymph nodes were evaluated and 3 showed metastatic invasive mammary carcinoma. She is tolerated her surgery well and is seen today for consideration of adjuvant radiation therapy to her left breast and peripheral lymphatics. She did have C. difficile causing significant diarrhea after her surgery. She was treated with Lomotil and anti-biotic therapy and this seems to be resolving at this time. She specifically denies left breast tenderness cough or bone pain.   PLANNED TREATMENT REGIMEN: Left breast and peripheral lymphatic radiation   PAST  MEDICAL HISTORY:  has a past medical history of Hypertension; Depression; Hyperlipidemia; Osteoarthritis; Insomnia; Cataracts, bilateral; UTI (lower urinary tract infection); Anxiety; COPD (chronic obstructive pulmonary disease); Shortness of breath dyspnea; Anemia; Neuropathy due to chemotherapeutic drug; and Breast cancer.    PAST SURGICAL HISTORY:  Past Surgical History  Procedure Laterality Date  . Mastectomy Right     lower 1990  . Eye surgery Bilateral     cataract extractions  . Breast biopsy Left 05/07/2015    Procedure: BREAST BIOPSY WITH NEEDLE LOCALIZATION;  Surgeon: Dia Crawford III, MD;  Location: [facility] ORS;  Service: General;  Laterality: Left;  . Breast lumpectomy with sentinel lymph node biopsy Left 05/07/2015    Procedure: PARTIAL MASTECTOMY WITH SENTINEL LYMPH NODE BX;  Surgeon: Dia Crawford III, MD;  Location: [facility] ORS;  Service: General;  Laterality: Left;    FAMILY HISTORY: family history includes Bladder Cancer in her father; Dementia in her mother.  SOCIAL HISTORY:  reports that she quit smoking about 23 years ago. Her smoking use included Cigarettes. She has a 50 pack-year smoking history. She has never used smokeless tobacco. She reports that she does not drink alcohol or use illicit drugs.  ALLERGIES: Diphenhydramine and Hydrochloric acid  MEDICATIONS:  Current Outpatient Prescriptions  Medication Sig Dispense Refill  . diphenoxylate-atropine (LOMOTIL) 2.5-0.025 MG per tablet Take 1 tablet by mouth as needed.    . Fluticasone-Salmeterol (ADVAIR DISKUS) 250-50 MCG/DOSE AEPB Inhale 1 puff into the lungs 2 (two) times daily. GARGLE AND RINSE AFTER EACH USE. 60 each 3  . furosemide (LASIX) 40 MG tablet Take 1 tablet (40 mg total) by mouth daily as needed. (Patient  taking differently: Take 40 mg by mouth daily. ) 30 tablet 3  . gabapentin (NEURONTIN) 100 MG capsule Take 1 capsule (100 mg total) by mouth 3 (three) times daily. 90 capsule 3  . HYDROcodone-acetaminophen (NORCO)  5-325 MG per tablet Take 1-2 tablets by mouth every 6 (six) hours as needed for moderate pain. 30 tablet 0  . ibuprofen (ADVIL,MOTRIN) 200 MG tablet Take 200 mg by mouth every 6 (six) hours as needed.    Marland Kitchen losartan (COZAAR) 50 MG tablet Take by mouth.    . metoprolol succinate (TOPROL-XL) 50 MG 24 hr tablet Take 1 tablet (50 mg total) by mouth daily. Take with or immediately following a meal. 30 tablet 3  . metroNIDAZOLE (FLAGYL) 500 MG tablet Take by mouth.    . potassium chloride SA (K-DUR,KLOR-CON) 20 MEQ tablet Take 1 tablet (20 mEq total) by mouth daily as needed. (Patient taking differently: Take 20 mEq by mouth daily. ) 30 tablet 3  . PROAIR HFA 108 (90 BASE) MCG/ACT inhaler Inhale 2 puffs into the lungs every 6 (six) hours as needed.     . sertraline (ZOLOFT) 100 MG tablet Take 100 mg by mouth daily.    . traMADol (ULTRAM) 50 MG tablet Take 50 mg by mouth 2 (two) times daily.     Marland Kitchen zolpidem (AMBIEN) 10 MG tablet Take 1 tablet (10 mg total) by mouth at bedtime. 30 tablet 1   No current facility-administered medications for this encounter.   Facility-Administered Medications Ordered in Other Encounters  Medication Dose Route Frequency Provider Last Rate Last Dose  . sodium chloride 0.9 % injection 10 mL  10 mL Intravenous PRN Leia Alf, MD      . sodium chloride 0.9 % injection 10 mL  10 mL Intracatheter PRN Leia Alf, MD   10 mL at 03/16/15 1110  . sodium chloride 0.9 % injection 10 mL  10 mL Intravenous PRN Leia Alf, MD   10 mL at 06/23/15 1236    ECOG PERFORMANCE STATUS:  0 - Asymptomatic  REVIEW OF SYSTEMS: Except for the recent bouts of diarrhea which is clearing Patient denies any weight loss, fatigue, weakness, fever, chills or night sweats. Patient denies any loss of vision, blurred vision. Patient denies any ringing  of the ears or hearing loss. No irregular heartbeat. Patient denies heart murmur or history of fainting. Patient denies any chest pain or pain  radiating to her upper extremities. Patient denies any shortness of breath, difficulty breathing at night, cough or hemoptysis. Patient denies any swelling in the lower legs. Patient denies any nausea vomiting, vomiting of blood, or coffee ground material in the vomitus. Patient denies any stomach pain. Patient states has had normal bowel movements no significant constipation or diarrhea. Patient denies any dysuria, hematuria or significant nocturia. Patient denies any problems walking, swelling in the joints or loss of balance. Patient denies any skin changes, loss of hair or loss of weight. Patient denies any excessive worrying or anxiety or significant depression. Patient denies any problems with insomnia. Patient denies excessive thirst, polyuria, polydipsia. Patient denies any swollen glands, patient denies easy bruising or easy bleeding. Patient denies any recent infections, allergies or URI. Patient "s visual fields have not changed significantly in recent time.    PHYSICAL EXAM: There were no vitals taken for this visit. She status post a horizontal incision of the left breast with contraction of the left breast. No dominant mass or nodularity is noted in the left breast in 2  positions examined. She is status post right modified radical mastectomy remotely which is well-healed. No axillary or supraclavicular adenopathy is identified. Well-developed well-nourished patient in NAD. HEENT reveals PERLA, EOMI, discs not visualized.  Oral cavity is clear. No oral mucosal lesions are identified. Neck is clear without evidence of cervical or supraclavicular adenopathy. Lungs are clear to A&P. Cardiac examination is essentially unremarkable with regular rate and rhythm without murmur rub or thrill. Abdomen is benign with no organomegaly or masses noted. Motor sensory and DTR levels are equal and symmetric in the upper and lower extremities. Cranial nerves II through XII are grossly intact. Proprioception is  intact. No peripheral adenopathy or edema is identified. No motor or sensory levels are noted. Crude visual fields are within normal range.  LABORATORY DATA: Surgical pathology reports are reviewed    RADIOLOGY RESULTS Mammograms and ultrasound reviewed  IMPRESSION: Stage IIa invasive mammary carcinoma of the left breast status post new adjuvant chemotherapy followed by left wide local excision and sentinel lymph node biopsy for ER/PR positive HER-2/neu negative invasive mammary carcinoma in 74 year old female   PLAN: At this time I have recommended left breast and peripheral lymphatic radiation would treat both areas to 5000 cGy boosting her scar another 1400 cGy using electron beam based on the relatively close margin. Risks and benefits of treatment including skin reaction, fatigue, inclusion of superficial lung, alteration of blood counts, retraction of her breast towards the horizontal scar, and slight possibility of lymphedema were all discussed in detail with the patient. I have emphasized the need for her to do daily or exercises to try to prevent lymphedema. I have set up and ordered CT simulation next week on the patient. She will also be candidate for aromatase inhibitor therapy after completion of radiation.  I would like to take this opportunity for allowing me to participate in the care of your patient.Armstead Peaks., MD

## 2015-06-24 LAB — CA 27.29 (SERIAL MONITOR): CA 27.29: 31.9 U/mL (ref 0.0–38.6)

## 2015-06-30 ENCOUNTER — Ambulatory Visit
Admission: RE | Admit: 2015-06-30 | Discharge: 2015-06-30 | Disposition: A | Discharge status: Home / Self Care | Payer: Medicare Other | Source: Ambulatory Visit | Attending: Radiation Oncology | Admitting: Radiation Oncology

## 2015-06-30 DIAGNOSIS — Z510 Encounter for antineoplastic radiation therapy: Secondary | ICD-10-CM | POA: Diagnosis present

## 2015-06-30 DIAGNOSIS — Z170 Estrogen receptor positive status [ER+]: Secondary | ICD-10-CM | POA: Diagnosis not present

## 2015-06-30 DIAGNOSIS — C50212 Malignant neoplasm of upper-inner quadrant of left female breast: Secondary | ICD-10-CM | POA: Diagnosis not present

## 2015-06-30 DIAGNOSIS — C773 Secondary and unspecified malignant neoplasm of axilla and upper limb lymph nodes: Secondary | ICD-10-CM | POA: Diagnosis not present

## 2015-07-02 ENCOUNTER — Other Ambulatory Visit: Payer: Self-pay | Admitting: *Deleted

## 2015-07-02 DIAGNOSIS — C50912 Malignant neoplasm of unspecified site of left female breast: Secondary | ICD-10-CM

## 2015-07-07 DIAGNOSIS — Z510 Encounter for antineoplastic radiation therapy: Secondary | ICD-10-CM | POA: Diagnosis not present

## 2015-07-08 ENCOUNTER — Ambulatory Visit
Admission: RE | Admit: 2015-07-08 | Discharge: 2015-07-08 | Disposition: A | Discharge status: Home / Self Care | Payer: Medicare Other | Source: Ambulatory Visit | Attending: Radiation Oncology | Admitting: Radiation Oncology

## 2015-07-08 DIAGNOSIS — Z510 Encounter for antineoplastic radiation therapy: Secondary | ICD-10-CM | POA: Diagnosis not present

## 2015-07-10 NOTE — Progress Notes (Signed)
Heidi [name]  [phone] [phone]) [phone]     ID: Heidi [name] OB: Jan 07, 1941  MR#: [id number]  PQZ#:[id number]  Patient Care Team: [name], MD as PCP - General (Family Medicine)  CHIEF COMPLAINT/DIAGNOSIS:  Left breast Invasive Mammary Carcinoma with biopsy-proven left axillary lymph node metastasis. Ultrasound/mammogram on 07/27/14 and reported 10 x 10 x 9 mm mass in the left upper inner breast 9:00 position, multiple thickened left axillary lymph nodes largest measuring 8 x 6 mm. Ultrasound-guided core biopsy of the left breast mass with clip placement, and left axillary lymph node biopsy on 08/17/14 reported as consistent with invasive mammary carcinoma in both biopsies, ER positive (>90%), PR positive (>90%), HER-2/neu negative (was 2+ on IHC, FISH negative with Her2/CEP17 ratio of 1.18).  Clinically stage T1N1Mx (at least stage IIA)  -  patient got neoadjuvant chemotherapy. Completed AC x 4 (09/28/14 - 12/02/14). Then weekly Taxol (12/23/14 - 03/23/15).  05/07/15 - Left breast lumpectomy and SLN study. Surgical Pathology Report - DIAGNOSIS:  A. LEFT BREAST; NEEDLE LOCALIZED PARTIAL MASTECTOMY:  - INVASIVE MAMMARY CARCINOMA OF NO SPECIAL TYPE, 15 MM.  - DUCTAL CARCINOMA IN SITU, NUCLEAR GRADE 1, CRIBRIFORM TYPE.  - BIOPSY SITE CHANGES, MARKER CLIP PRESENT.  - MARGINS OF EXCISION ARE NEGATIVE, CLOSEST MARGIN TO INVASIVE CARCINOMA IS INFERIOR.  - SEE CANCER CASE SUMMARY BELOW.  B. SENTINEL LYMPH NODE; EXCISION:  - THREE OF FOUR SENTINEL LYMPH NODES INVOLVED BY METASTASES (3/4),  LARGEST TUMOR FOCUS MEASURES 7 MM.  C. SENTINEL LYMPH NODES; EXCISION:  - NO TUMOR SEEN IN TWO SENTINEL LYMPH NODES (0/2).    HISTORY OF PRESENT ILLNESS:  Patient returns for continued oncology followup, she underwent lumpectomy and sentinel lymph node study on July 8, pathology described above. States that she is doing fairly steady. She still has constant neuropathy in  both hands and feet, with some difficulty in using hands and also feeling ground when she walks but denies imbalance or falls. States that neuropathy symptoms is slowly improving. Otherwise no new complaints. No fevers or chills. No nausea or vomiting.     REVIEW OF SYSTEMS:   ROS As in HPI above. In addition, no fever, chills or sweats. No new headaches or focal weakness.  No new mood disturbances. No  sore throat, cough, shortness of breath, sputum, hemoptysis or chest pain. No dizziness or palpitation. No abdominal pain, constipation, diarrhea, dysuria or hematuria. No new skin rash or bleeding symptoms. No polyuria polydipsia.  PAST MEDICAL HISTORY: Past Medical History  Diagnosis Date  . Hypertension   . Depression   . Hyperlipidemia   . Osteoarthritis   . Insomnia   . Cataracts, bilateral   . UTI (lower urinary tract infection)   . Anxiety   . COPD (chronic obstructive pulmonary disease)     mild COPD  . Shortness of breath dyspnea   . Anemia   . 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 Breast cancer     1990 right breast. 07/2014 left breast cancer    PAST SURGICAL HISTORY: Past Surgical History  Procedure Laterality Date  . Mastectomy Right     lower 1990  . Eye surgery Bilateral     cataract extractions  . Breast biopsy Left 05/07/2015    Procedure: BREAST BIOPSY WITH NEEDLE LOCALIZATION;  Surgeon: Dia Crawford III, MD;  Location: [facility] ORS;  Service: General;  Laterality: Left;  . Breast lumpectomy with sentinel lymph node biopsy Left 05/07/2015    Procedure:  PARTIAL MASTECTOMY WITH SENTINEL LYMPH NODE BX;  Surgeon: Dia Crawford III, MD;  Location: [facility] OR[facility];  Service: General;  Laterality: Left;    FAMILY HISTORY: Family History  Problem Relation Age of Onset  . Dementia Mother   . Bladder Cancer Father     SOCIAL HISTORY: Social History  Substance Use Topics  . Smoking status: Former Smoker -- 2.00 packs/day for 25 years    Types: Cigarettes    Quit date:  04/28/1992  . Smokeless tobacco: Never Used     Comment: Pt quit 1995  . Alcohol Use: No    Allergies  Allergen Reactions  . Diphenhydramine Other (See Comments)    pt states makes legs very restless and ache  . Hydrochloric Acid Hives    Other reaction(s): Blisters Uncoded Allergy. Allergen: HYDROCHLORLIC ACID.    Current Outpatient Prescriptions  Medication Sig Dispense Refill  . diphenoxylate-atropine (LOMOTIL) 2.5-0.025 MG per tablet Take 1 tablet by mouth as needed.    . Fluticasone-Salmeterol (ADVAIR DISKUS) 250-50 MCG/DOSE AEPB Inhale 1 puff into the lungs 2 (two) times daily. GARGLE AND RINSE AFTER EACH USE. 60 each 3  . furosemide (LASIX) 40 MG tablet Take 1 tablet (40 mg total) by mouth daily as needed. (Patient taking differently: Take 40 mg by mouth daily. ) 30 tablet 3  . gabapentin (NEURONTIN) 100 MG capsule Take 1 capsule (100 mg total) by mouth 3 (three) times daily. 90 capsule 3  . HYDROcodone-acetaminophen (NORCO) 5-325 MG per tablet Take 1-2 tablets by mouth every 6 (six) hours as needed for moderate pain. 30 tablet 0  . ibuprofen (ADVIL,MOTRIN) 200 MG tablet Take 200 mg by mouth every 6 (six) hours as needed.    Marland Kitchen losartan (COZAAR) 50 MG tablet Take by mouth.    . metoprolol succinate (TOPROL-XL) 50 MG 24 hr tablet Take 1 tablet (50 mg total) by mouth daily. Take with or immediately following a meal. 30 tablet 3  . potassium chloride SA (K-DUR,KLOR-CON) 20 MEQ tablet Take 1 tablet (20 mEq total) by mouth daily as needed. (Patient taking differently: Take 20 mEq by mouth daily. ) 30 tablet 3  . PROAIR HFA 108 (90 BASE) MCG/ACT inhaler Inhale 2 puffs into the lungs every 6 (six) hours as needed.     . sertraline (ZOLOFT) 100 MG tablet Take 100 mg by mouth daily.    . traMADol (ULTRAM) 50 MG tablet Take 50 mg by mouth 2 (two) times daily.     Marland Kitchen zolpidem (AMBIEN) 10 MG tablet Take 1 tablet (10 mg total) by mouth at bedtime. 30 tablet 1   No current facility-administered  medications for this visit.   Facility-Administered Medications Ordered in Other Visits  Medication Dose Route Frequency Provider Last Rate Last Dose  . sodium chloride 0.9 % injection 10 mL  10 mL Intravenous PRN Leia Alf, MD      . sodium chloride 0.9 % injection 10 mL  10 mL Intracatheter PRN Leia Alf, MD   10 mL at 03/16/15 1110  . sodium chloride 0.9 % injection 10 mL  10 mL Intravenous PRN Leia Alf, MD   10 mL at 06/23/15 1236    OBJECTIVE: Filed Vitals:   06/23/15 1353  BP: 146/84  Pulse: 93  Temp: 96.9 F (36.1 C)  Resp: 18     Body mass index is 31.75 kg/(m^2).      GENERAL: Patient is alert and oriented and in no acute distress. There is no icterus. HEENT: EOMs  intact. No cervical lymphadenopathy. CVS: S1S2, regular LUNGS: Bilaterally clear to auscultation, no rhonchi. ABDOMEN: Soft, nontender. No hepatomegaly clinically.  NEURO: grossly nonfocal, cranial nerves are intact.   EXTREMITIES: No pedal edema.   LAB RESULTS:    Component Value Date/Time   NA 139 04/29/2015 0941   NA 136 02/19/2015 2136   K 4.2 04/29/2015 0941   K 4.0 02/19/2015 2136   CL 103 04/29/2015 0941   CL 104 02/19/2015 2136   CO2 25 04/29/2015 0941   CO2 25 02/19/2015 2136   GLUCOSE 119* 04/29/2015 0941   GLUCOSE 124* 02/19/2015 2136   BUN 17 04/29/2015 0941   BUN 11 02/19/2015 2136   CREATININE 0.68 06/23/2015 1234   CREATININE 0.46 02/19/2015 2136   CALCIUM 9.3 04/29/2015 0941   CALCIUM 8.5* 02/19/2015 2136   PROT 6.3* 06/23/2015 1234   PROT 6.3* 02/19/2015 2136   ALBUMIN 3.5 06/23/2015 1234   ALBUMIN 3.8 02/19/2015 2136   AST 27 06/23/2015 1234   AST 21 02/19/2015 2136   ALT 27 06/23/2015 1234   ALT 19 02/19/2015 2136   ALKPHOS 55 06/23/2015 1234   ALKPHOS 78 02/19/2015 2136   BILITOT 0.3 06/23/2015 1234   BILITOT 0.8 02/19/2015 2136   GFRNONAA >60 06/23/2015 1234   GFRNONAA >60 02/19/2015 2136   GFRNONAA >60 11/23/2014 1541   GFRAA >60 06/23/2015 1234    GFRAA >60 02/19/2015 2136   GFRAA >60 11/23/2014 1541   Lab Results  Component Value Date   WBC 6.6 06/23/2015   NEUTROABS 4.4 06/23/2015   HGB 11.3* 06/23/2015   HCT 33.9* 06/23/2015   MCV 93.2 06/23/2015   PLT 414 06/23/2015     STUDIES: 05/07/15 - Left breast lumpectomy and SLN study. Surgical Pathology Report -  SPECIMEN SUBMITTED:  A. Breast, left partial mastectomy. B. Lymph node, sentinel. C. Lymph nodes, sentinel. DIAGNOSIS:  A. LEFT BREAST; NEEDLE LOCALIZED PARTIAL MASTECTOMY:  - INVASIVE MAMMARY CARCINOMA OF NO SPECIAL TYPE, 15 MM.  - DUCTAL CARCINOMA IN SITU, NUCLEAR GRADE 1, CRIBRIFORM TYPE.  - BIOPSY SITE CHANGES, MARKER CLIP PRESENT.  - MARGINS OF EXCISION ARE NEGATIVE, CLOSEST MARGIN TO INVASIVE CARCINOMA IS INFERIOR.  - SEE CANCER CASE SUMMARY BELOW.  B. SENTINEL LYMPH NODE; EXCISION:  - THREE OF FOUR SENTINEL LYMPH NODES INVOLVED BY METASTASES (3/4),  LARGEST TUMOR FOCUS MEASURES 7 MM.  C. SENTINEL LYMPH NODES; EXCISION:  - NO TUMOR SEEN IN TWO SENTINEL LYMPH NODES (0/2).  INVASIVE CARCINOMA OF THE BREAST: Complete Excision (Less Than Total  Mastectomy, Including Specimens Designated Biopsy, Lumpectomy,  Quadrantectomy, and Partial Mastectomy With or Without Axillary  Contents) and Mastectomy (Total, Modified Radical, Radical With or  Without Axillary Contents)Radical, Radical With or Without Axillary  Contents)  Specimens InvolvedA: Breast, left partial mastectomy  Breast Invasive Carcinoma Cancer Case Summary  SPECIMEN  Procedure:   Excision with wire-guided localization  Lymph Node Sampling:   Sentinel lymph node(s)  Specimen Laterality:   Left  TUMOR  Presence of Invasive Carcinoma:  Histologic Type of Invasive  Carcinoma:  Invasive ductal carcinoma (no special type or not otherwise specified)  Histologic Grade: Nottingham Histologic Score  Glandular (Acinar) / Tubular Differentiation:  Score 1: > 75% of tumor area forming glandular / tubular  structures  Nuclear Pleomorphism:  Score 1: Nuclei small with little increase in size in comparison with  normal breast epithelial cells, regular outlines, uniform nuclear  chromatin, little variation in size  Mitotic Rate  Score 1 (<=3 mitoses  per mm2)  Overall Grade: Grade 1: scores of 3, 4 or 5  Ductal Carcinoma In Situ (DCIS):  DCIS is present  EXTENT  Tumor Size / Focality:  Tumor Size: Size of Largest Invasive Carcinoma:  Greatest dimension of largest focus of invasion > 1 mm  Greatest Dimension (mm): 1.5cm  MARGINS  Invasive Carcinoma: Margins uninvolved by invasive carcinoma  Distance from Closest Margin (mm):   Distance (specify in mm)  67m  Ductal Carcinoma In Situ (DCIS):  Margins uninvolved by DCIS (DCIS  present in specimen)  Distance of DCIS from Closest Margin (mm):  Distance is > 10 mm  LYMPH NODES  Status of Lymph Nodes:  Total Number of Lymph Nodes Examined:  Specify  6  Micro / Macro Metastases:   Present  Number of Lymph Nodes with Macrometastases (> 2 mm):  Specify  2  Number of Lymph Nodes with Micrometastases: Specify  1  Number of Sentinel Lymph Nodes Examined:   Specify  6  ACCESSORY FINDINGS  Lymph-Vascular Invasion:Not Identified  STAGE (pTNM)  TNM Descriptors:  y (post-treatment)  Primary Tumor (Invasive Carcinoma) (pT):  pT1c: Tumor > 10 mm but <= 20 mm in greatest dimension  Regional Lymph Nodes (pN) (choose a category based on lymph nodes  received with the specimen; immunohistochemistry and / or molecular  studies are not required)  Category (pN):  pN1a: Metastases in 1 to 3 axillary lymph nodes, at least 1 metastasis  greater than 2.0 mm##  Distant Metastasis (pM): N/A  Note:  Treatment Effect: Response to Presurgical (Neoadjuvant) Therapy  In the Breast: No definite response to presurgical therapy in the  invasive carcinoma  In the Lymph Nodes: No definite response to presurgical therapy in  metastatic  carcinoma.  02/19/15 - CT chest. IMPRESSION:  1. Relatively small pulmonary embolus noted within a segmental branch to the right upper lung lobe. No additional pulmonary embolus seen. 2. Mild patchy atelectasis or scarring within both lower lobes. Minimal pneumonia cannot be excluded at the superior aspect of the right lower lobe. 3. Multiple thoracic vertebral body hemangiomas incidentally noted.    ASSESSMENT / PLAN:   1. Left breast Invasive Mammary Carcinoma with biopsy-proven left axillary lymph node metastasis. Ultrasound/mammogram on 07/27/14 and reported 10 x 10 x 9 mm mass in the left upper inner breast 9:00 position, multiple thickened left axillary lymph nodes largest measuring 8 x 6 mm. Ultrasound-guided core biopsy of the left breast mass with clip placement, and left axillary lymph node biopsy on 08/17/14 reported as consistent with invasive mammary carcinoma in both biopsies, ER positive (>90%), PR positive (>90%), HER-2/neu negative (was 2+ on IHC, FISH negative with Her2/CEP17 ratio of 1.18).  Clinically stage T1N1M0 (at least stage IIA). Repeat mammogram/ultrasound of the left breast on February 16 indicated positive response to neoadjuvant chemotherapy with decrease in size of the mass which is now 7.5 x 6.6 x 5.6 mm, no enlarged axillary lymph nodes seen and also have decreased in size from the prior study. Got neoadjuvant chemotherapy (AC x 4, then weekly Taxol). Lumpectomy and sentinel lymph node study on 05/07/1959 showed persistent 15 mm tumor in the breast, persistent disease in lymph nodes. - Have reviewed surgical pathology report and discussed with patient. She has started postradiation. Have explained that given tumor is ER and PR positive, she would benefit from adjuvant hormonal therapy with aromatase inhibitors. We'll see her back in about 6 weeks and plan adjuvant hormonal therapy if she has completed radiation, could  consider screening for SWOG study at that time. 2.  Peripheral neuropathy from taxol treatment - beginning to slowly improve, continue on gabapentin. 3. Continue port flush every 6 weeks. 4. Dyspnea on exertion, small PE diagnosed 02/19/15 - Patient is seeing Cardiology and Pulmonary. On anticoagulation with Eliquis. 5. In between visits, patient advised to call or come to ER in case of any worsening symptoms or acute sickness. She is agreeable to this plan.      Leia Alf, MD   07/10/2015 3:13 AM

## 2015-07-12 ENCOUNTER — Ambulatory Visit
Admission: RE | Admit: 2015-07-12 | Discharge: 2015-07-12 | Disposition: A | Discharge status: Home / Self Care | Payer: Medicare Other | Source: Ambulatory Visit | Attending: Radiation Oncology | Admitting: Radiation Oncology

## 2015-07-12 DIAGNOSIS — Z510 Encounter for antineoplastic radiation therapy: Secondary | ICD-10-CM | POA: Diagnosis not present

## 2015-07-13 ENCOUNTER — Inpatient Hospital Stay: Payer: Medicare Other | Attending: Internal Medicine

## 2015-07-13 ENCOUNTER — Ambulatory Visit
Admission: RE | Admit: 2015-07-13 | Discharge: 2015-07-13 | Disposition: A | Discharge status: Home / Self Care | Payer: Medicare Other | Source: Ambulatory Visit | Attending: Radiation Oncology | Admitting: Radiation Oncology

## 2015-07-13 DIAGNOSIS — Z853 Personal history of malignant neoplasm of breast: Secondary | ICD-10-CM | POA: Diagnosis not present

## 2015-07-13 DIAGNOSIS — Z452 Encounter for adjustment and management of vascular access device: Secondary | ICD-10-CM | POA: Diagnosis not present

## 2015-07-13 DIAGNOSIS — Z9221 Personal history of antineoplastic chemotherapy: Secondary | ICD-10-CM | POA: Insufficient documentation

## 2015-07-13 DIAGNOSIS — Z510 Encounter for antineoplastic radiation therapy: Secondary | ICD-10-CM | POA: Diagnosis not present

## 2015-07-13 DIAGNOSIS — C50212 Malignant neoplasm of upper-inner quadrant of left female breast: Secondary | ICD-10-CM | POA: Insufficient documentation

## 2015-07-13 DIAGNOSIS — Z170 Estrogen receptor positive status [ER+]: Secondary | ICD-10-CM | POA: Insufficient documentation

## 2015-07-13 DIAGNOSIS — C801 Malignant (primary) neoplasm, unspecified: Secondary | ICD-10-CM

## 2015-07-13 DIAGNOSIS — C773 Secondary and unspecified malignant neoplasm of axilla and upper limb lymph nodes: Secondary | ICD-10-CM | POA: Insufficient documentation

## 2015-07-13 MED ORDER — SODIUM CHLORIDE 0.9 % IJ SOLN
10.0000 mL | INTRAMUSCULAR | Status: DC | PRN
  Administered 2015-07-13: 10 mL via INTRAVENOUS
  Filled 2015-07-13: qty 10

## 2015-07-13 MED ORDER — HEPARIN SOD (PORK) LOCK FLUSH 100 UNIT/ML IV SOLN
500.0000 [IU] | Freq: Once | INTRAVENOUS | Status: AC
  Administered 2015-07-13: 500 [IU] via INTRAVENOUS
  Filled 2015-07-13: qty 5

## 2015-07-14 ENCOUNTER — Ambulatory Visit
Admission: RE | Admit: 2015-07-14 | Discharge: 2015-07-14 | Disposition: A | Discharge status: Home / Self Care | Payer: Medicare Other | Source: Ambulatory Visit | Attending: Radiation Oncology | Admitting: Radiation Oncology

## 2015-07-14 DIAGNOSIS — Z510 Encounter for antineoplastic radiation therapy: Secondary | ICD-10-CM | POA: Diagnosis not present

## 2015-07-15 ENCOUNTER — Ambulatory Visit
Admission: RE | Admit: 2015-07-15 | Discharge: 2015-07-15 | Disposition: A | Discharge status: Home / Self Care | Payer: Medicare Other | Source: Ambulatory Visit | Attending: Radiation Oncology | Admitting: Radiation Oncology

## 2015-07-15 DIAGNOSIS — Z510 Encounter for antineoplastic radiation therapy: Secondary | ICD-10-CM | POA: Diagnosis not present

## 2015-07-16 ENCOUNTER — Ambulatory Visit
Admission: RE | Admit: 2015-07-16 | Discharge: 2015-07-16 | Disposition: A | Discharge status: Home / Self Care | Payer: Medicare Other | Source: Ambulatory Visit | Attending: Radiation Oncology | Admitting: Radiation Oncology

## 2015-07-16 DIAGNOSIS — Z510 Encounter for antineoplastic radiation therapy: Secondary | ICD-10-CM | POA: Diagnosis not present

## 2015-07-19 ENCOUNTER — Ambulatory Visit
Admission: RE | Admit: 2015-07-19 | Discharge: 2015-07-19 | Disposition: A | Discharge status: Home / Self Care | Payer: Medicare Other | Source: Ambulatory Visit | Attending: Radiation Oncology | Admitting: Radiation Oncology

## 2015-07-19 DIAGNOSIS — Z510 Encounter for antineoplastic radiation therapy: Secondary | ICD-10-CM | POA: Diagnosis not present

## 2015-07-20 ENCOUNTER — Ambulatory Visit
Admission: RE | Admit: 2015-07-20 | Discharge: 2015-07-20 | Disposition: A | Discharge status: Home / Self Care | Payer: Medicare Other | Source: Ambulatory Visit | Attending: Radiation Oncology | Admitting: Radiation Oncology

## 2015-07-20 DIAGNOSIS — Z510 Encounter for antineoplastic radiation therapy: Secondary | ICD-10-CM | POA: Diagnosis not present

## 2015-07-21 ENCOUNTER — Ambulatory Visit
Admission: RE | Admit: 2015-07-21 | Discharge: 2015-07-21 | Disposition: A | Discharge status: Home / Self Care | Payer: Medicare Other | Source: Ambulatory Visit | Attending: Radiation Oncology | Admitting: Radiation Oncology

## 2015-07-21 DIAGNOSIS — Z510 Encounter for antineoplastic radiation therapy: Secondary | ICD-10-CM | POA: Diagnosis not present

## 2015-07-22 ENCOUNTER — Inpatient Hospital Stay: Payer: Medicare Other

## 2015-07-22 ENCOUNTER — Ambulatory Visit
Admission: RE | Admit: 2015-07-22 | Discharge: 2015-07-22 | Disposition: A | Discharge status: Home / Self Care | Payer: Medicare Other | Source: Ambulatory Visit | Attending: Radiation Oncology | Admitting: Radiation Oncology

## 2015-07-22 DIAGNOSIS — C50912 Malignant neoplasm of unspecified site of left female breast: Secondary | ICD-10-CM

## 2015-07-22 DIAGNOSIS — M1710 Unilateral primary osteoarthritis, unspecified knee: Secondary | ICD-10-CM | POA: Insufficient documentation

## 2015-07-22 DIAGNOSIS — G4700 Insomnia, unspecified: Secondary | ICD-10-CM | POA: Insufficient documentation

## 2015-07-22 DIAGNOSIS — IMO0002 Reserved for concepts with insufficient information to code with codable children: Secondary | ICD-10-CM | POA: Insufficient documentation

## 2015-07-22 DIAGNOSIS — Z510 Encounter for antineoplastic radiation therapy: Secondary | ICD-10-CM | POA: Diagnosis not present

## 2015-07-22 DIAGNOSIS — C50212 Malignant neoplasm of upper-inner quadrant of left female breast: Secondary | ICD-10-CM | POA: Diagnosis not present

## 2015-07-22 LAB — CBC
HEMATOCRIT: 36 % (ref 35.0–47.0)
Hemoglobin: 11.9 g/dL — ABNORMAL LOW (ref 12.0–16.0)
MCH: 31.5 pg (ref 26.0–34.0)
MCHC: 33.2 g/dL (ref 32.0–36.0)
MCV: 94.8 fL (ref 80.0–100.0)
Platelets: 271 10*3/uL (ref 150–440)
RBC: 3.79 MIL/uL — ABNORMAL LOW (ref 3.80–5.20)
RDW: 16.2 % — ABNORMAL HIGH (ref 11.5–14.5)
WBC: 6.1 10*3/uL (ref 3.6–11.0)

## 2015-07-23 ENCOUNTER — Ambulatory Visit
Admission: RE | Admit: 2015-07-23 | Discharge: 2015-07-23 | Disposition: A | Discharge status: Home / Self Care | Payer: Medicare Other | Source: Ambulatory Visit | Attending: Radiation Oncology | Admitting: Radiation Oncology

## 2015-07-23 DIAGNOSIS — Z510 Encounter for antineoplastic radiation therapy: Secondary | ICD-10-CM | POA: Diagnosis not present

## 2015-07-26 ENCOUNTER — Ambulatory Visit
Admission: RE | Admit: 2015-07-26 | Discharge: 2015-07-26 | Disposition: A | Discharge status: Home / Self Care | Payer: Medicare Other | Source: Ambulatory Visit | Attending: Radiation Oncology | Admitting: Radiation Oncology

## 2015-07-26 DIAGNOSIS — Z510 Encounter for antineoplastic radiation therapy: Secondary | ICD-10-CM | POA: Diagnosis not present

## 2015-07-27 ENCOUNTER — Ambulatory Visit: Payer: Medicare Other

## 2015-07-28 ENCOUNTER — Ambulatory Visit
Admission: RE | Admit: 2015-07-28 | Discharge: 2015-07-28 | Disposition: A | Discharge status: Home / Self Care | Payer: Medicare Other | Source: Ambulatory Visit | Attending: Radiation Oncology | Admitting: Radiation Oncology

## 2015-07-28 DIAGNOSIS — Z510 Encounter for antineoplastic radiation therapy: Secondary | ICD-10-CM | POA: Diagnosis not present

## 2015-07-29 ENCOUNTER — Ambulatory Visit: Payer: Medicare Other

## 2015-07-30 ENCOUNTER — Ambulatory Visit: Payer: Medicare Other

## 2015-08-02 ENCOUNTER — Ambulatory Visit
Admission: RE | Admit: 2015-08-02 | Discharge: 2015-08-02 | Disposition: A | Discharge status: Home / Self Care | Payer: Medicare Other | Source: Ambulatory Visit | Attending: Radiation Oncology | Admitting: Radiation Oncology

## 2015-08-02 DIAGNOSIS — Z510 Encounter for antineoplastic radiation therapy: Secondary | ICD-10-CM | POA: Diagnosis not present

## 2015-08-03 ENCOUNTER — Ambulatory Visit
Admission: RE | Admit: 2015-08-03 | Discharge: 2015-08-03 | Disposition: A | Discharge status: Home / Self Care | Payer: Medicare Other | Source: Ambulatory Visit | Attending: Radiation Oncology | Admitting: Radiation Oncology

## 2015-08-03 DIAGNOSIS — Z510 Encounter for antineoplastic radiation therapy: Secondary | ICD-10-CM | POA: Diagnosis not present

## 2015-08-04 ENCOUNTER — Inpatient Hospital Stay: Payer: Medicare Other | Attending: Internal Medicine

## 2015-08-04 ENCOUNTER — Inpatient Hospital Stay: Payer: Medicare Other

## 2015-08-04 ENCOUNTER — Encounter: Payer: Self-pay | Admitting: *Deleted

## 2015-08-04 ENCOUNTER — Ambulatory Visit
Admission: RE | Admit: 2015-08-04 | Discharge: 2015-08-04 | Disposition: A | Discharge status: Home / Self Care | Payer: Medicare Other | Source: Ambulatory Visit | Attending: Radiation Oncology | Admitting: Radiation Oncology

## 2015-08-04 ENCOUNTER — Encounter: Payer: Self-pay | Admitting: Internal Medicine

## 2015-08-04 ENCOUNTER — Inpatient Hospital Stay (HOSPITAL_BASED_OUTPATIENT_CLINIC_OR_DEPARTMENT_OTHER): Payer: Medicare Other | Admitting: Internal Medicine

## 2015-08-04 VITALS — BP 117/82 | HR 91 | Temp 97.0°F | Resp 18 | Ht 68.0 in | Wt 208.8 lb

## 2015-08-04 DIAGNOSIS — Z170 Estrogen receptor positive status [ER+]: Secondary | ICD-10-CM | POA: Insufficient documentation

## 2015-08-04 DIAGNOSIS — I2699 Other pulmonary embolism without acute cor pulmonale: Secondary | ICD-10-CM

## 2015-08-04 DIAGNOSIS — C50912 Malignant neoplasm of unspecified site of left female breast: Secondary | ICD-10-CM

## 2015-08-04 DIAGNOSIS — R5382 Chronic fatigue, unspecified: Secondary | ICD-10-CM

## 2015-08-04 DIAGNOSIS — C773 Secondary and unspecified malignant neoplasm of axilla and upper limb lymph nodes: Secondary | ICD-10-CM

## 2015-08-04 DIAGNOSIS — Z9221 Personal history of antineoplastic chemotherapy: Secondary | ICD-10-CM | POA: Diagnosis not present

## 2015-08-04 DIAGNOSIS — Z510 Encounter for antineoplastic radiation therapy: Secondary | ICD-10-CM | POA: Diagnosis not present

## 2015-08-04 DIAGNOSIS — C50212 Malignant neoplasm of upper-inner quadrant of left female breast: Secondary | ICD-10-CM | POA: Diagnosis not present

## 2015-08-04 DIAGNOSIS — T451X5S Adverse effect of antineoplastic and immunosuppressive drugs, sequela: Secondary | ICD-10-CM | POA: Diagnosis not present

## 2015-08-04 DIAGNOSIS — J449 Chronic obstructive pulmonary disease, unspecified: Secondary | ICD-10-CM

## 2015-08-04 DIAGNOSIS — Z23 Encounter for immunization: Secondary | ICD-10-CM | POA: Insufficient documentation

## 2015-08-04 DIAGNOSIS — Z79899 Other long term (current) drug therapy: Secondary | ICD-10-CM | POA: Diagnosis not present

## 2015-08-04 DIAGNOSIS — Z87891 Personal history of nicotine dependence: Secondary | ICD-10-CM

## 2015-08-04 DIAGNOSIS — I10 Essential (primary) hypertension: Secondary | ICD-10-CM | POA: Insufficient documentation

## 2015-08-04 DIAGNOSIS — E785 Hyperlipidemia, unspecified: Secondary | ICD-10-CM | POA: Insufficient documentation

## 2015-08-04 DIAGNOSIS — Z452 Encounter for adjustment and management of vascular access device: Secondary | ICD-10-CM | POA: Diagnosis not present

## 2015-08-04 DIAGNOSIS — G620 Drug-induced polyneuropathy: Secondary | ICD-10-CM | POA: Diagnosis not present

## 2015-08-04 DIAGNOSIS — M1990 Unspecified osteoarthritis, unspecified site: Secondary | ICD-10-CM | POA: Diagnosis not present

## 2015-08-04 LAB — HEPATIC FUNCTION PANEL
ALT: 19 U/L (ref 14–54)
AST: 22 U/L (ref 15–41)
Albumin: 3.9 g/dL (ref 3.5–5.0)
Alkaline Phosphatase: 66 U/L (ref 38–126)
BILIRUBIN DIRECT: 0.1 mg/dL (ref 0.1–0.5)
Indirect Bilirubin: 0.4 mg/dL (ref 0.3–0.9)
TOTAL PROTEIN: 6.9 g/dL (ref 6.5–8.1)
Total Bilirubin: 0.5 mg/dL (ref 0.3–1.2)

## 2015-08-04 LAB — CBC WITH DIFFERENTIAL/PLATELET
BASOS PCT: 1 %
Basophils Absolute: 0 10*3/uL (ref 0–0.1)
EOS PCT: 3 %
Eosinophils Absolute: 0.1 10*3/uL (ref 0–0.7)
HCT: 33.6 % — ABNORMAL LOW (ref 35.0–47.0)
Hemoglobin: 11.6 g/dL — ABNORMAL LOW (ref 12.0–16.0)
Lymphocytes Relative: 16 %
Lymphs Abs: 0.8 10*3/uL — ABNORMAL LOW (ref 1.0–3.6)
MCH: 32.7 pg (ref 26.0–34.0)
MCHC: 34.4 g/dL (ref 32.0–36.0)
MCV: 95.2 fL (ref 80.0–100.0)
MONO ABS: 0.6 10*3/uL (ref 0.2–0.9)
Monocytes Relative: 11 %
NEUTROS ABS: 3.4 10*3/uL (ref 1.4–6.5)
Neutrophils Relative %: 69 %
PLATELETS: 250 10*3/uL (ref 150–440)
RBC: 3.53 MIL/uL — ABNORMAL LOW (ref 3.80–5.20)
RDW: 16.2 % — AB (ref 11.5–14.5)
WBC: 4.9 10*3/uL (ref 3.6–11.0)

## 2015-08-04 LAB — CREATININE, SERUM
Creatinine, Ser: 0.66 mg/dL (ref 0.44–1.00)
GFR calc Af Amer: >60 mL/min (ref >60)

## 2015-08-04 MED ORDER — INFLUENZA VAC SPLIT QUAD 0.5 ML IM SUSY
0.5000 mL | PREFILLED_SYRINGE | Freq: Once | INTRAMUSCULAR | Status: AC
  Administered 2015-08-04: 0.5 mL via INTRAMUSCULAR

## 2015-08-04 MED ORDER — HEPARIN SOD (PORK) LOCK FLUSH 100 UNIT/ML IV SOLN
INTRAVENOUS | Status: AC
  Filled 2015-08-04: qty 5

## 2015-08-04 MED ORDER — SODIUM CHLORIDE 0.9 % IJ SOLN
10.0000 mL | INTRAMUSCULAR | Status: DC | PRN
  Administered 2015-08-04: 10 mL via INTRAVENOUS
  Filled 2015-08-04: qty 10

## 2015-08-04 MED ORDER — HEPARIN SOD (PORK) LOCK FLUSH 100 UNIT/ML IV SOLN
500.0000 [IU] | Freq: Once | INTRAVENOUS | Status: AC
  Administered 2015-08-04: 500 [IU] via INTRAVENOUS

## 2015-08-04 NOTE — Progress Notes (Signed)
Still with neuropathy in fingers 4th and 5 th visit and the other fingers on the hands just fingertips.  Her feet all on the bottom -feels like prickly things touching her feet. Cold to her feet. She is taking the neurontin 4 tablets a day and would like to increase.

## 2015-08-05 ENCOUNTER — Ambulatory Visit
Admission: RE | Admit: 2015-08-05 | Discharge: 2015-08-05 | Disposition: A | Discharge status: Home / Self Care | Payer: Medicare Other | Source: Ambulatory Visit | Attending: Radiation Oncology | Admitting: Radiation Oncology

## 2015-08-05 ENCOUNTER — Inpatient Hospital Stay: Payer: Medicare Other

## 2015-08-05 ENCOUNTER — Other Ambulatory Visit: Payer: Self-pay | Admitting: *Deleted

## 2015-08-05 DIAGNOSIS — Z510 Encounter for antineoplastic radiation therapy: Secondary | ICD-10-CM | POA: Diagnosis not present

## 2015-08-05 DIAGNOSIS — G620 Drug-induced polyneuropathy: Secondary | ICD-10-CM

## 2015-08-05 DIAGNOSIS — T451X5A Adverse effect of antineoplastic and immunosuppressive drugs, initial encounter: Principal | ICD-10-CM

## 2015-08-06 ENCOUNTER — Ambulatory Visit
Admission: RE | Admit: 2015-08-06 | Discharge: 2015-08-06 | Disposition: A | Discharge status: Home / Self Care | Payer: Medicare Other | Source: Ambulatory Visit | Attending: Radiation Oncology | Admitting: Radiation Oncology

## 2015-08-06 DIAGNOSIS — Z510 Encounter for antineoplastic radiation therapy: Secondary | ICD-10-CM | POA: Diagnosis not present

## 2015-08-06 MED ORDER — GABAPENTIN 100 MG PO CAPS: 300.0000 mg | ORAL_CAPSULE | Freq: Three times a day (TID) | ORAL | Status: DC

## 2015-08-06 NOTE — Telephone Encounter (Signed)
Called pt and let her know that md sent in the rx for gabapentin 100 mg 3 tablets tid and when she needs it she can pick it up

## 2015-08-09 ENCOUNTER — Ambulatory Visit
Admission: RE | Admit: 2015-08-09 | Discharge: 2015-08-09 | Disposition: A | Discharge status: Home / Self Care | Payer: Medicare Other | Source: Ambulatory Visit | Attending: Radiation Oncology | Admitting: Radiation Oncology

## 2015-08-09 DIAGNOSIS — Z510 Encounter for antineoplastic radiation therapy: Secondary | ICD-10-CM | POA: Diagnosis not present

## 2015-08-10 ENCOUNTER — Other Ambulatory Visit
Admission: RE | Admit: 2015-08-10 | Discharge: 2015-08-10 | Disposition: A | Discharge status: Home / Self Care | Payer: Medicare Other | Source: Other Acute Inpatient Hospital | Attending: Unknown Physician Specialty | Admitting: Unknown Physician Specialty

## 2015-08-10 ENCOUNTER — Ambulatory Visit: Payer: Medicare Other

## 2015-08-10 ENCOUNTER — Ambulatory Visit
Admission: RE | Admit: 2015-08-10 | Discharge: 2015-08-10 | Disposition: A | Discharge status: Home / Self Care | Payer: Medicare Other | Source: Ambulatory Visit | Attending: Radiation Oncology | Admitting: Radiation Oncology

## 2015-08-10 DIAGNOSIS — R197 Diarrhea, unspecified: Secondary | ICD-10-CM | POA: Diagnosis present

## 2015-08-10 DIAGNOSIS — Z510 Encounter for antineoplastic radiation therapy: Secondary | ICD-10-CM | POA: Diagnosis not present

## 2015-08-10 LAB — C DIFFICILE QUICK SCREEN W PCR REFLEX
C DIFFICILE (CDIFF) TOXIN: NEGATIVE
C Diff antigen: POSITIVE — AB

## 2015-08-10 LAB — CLOSTRIDIUM DIFFICILE BY PCR: Toxigenic C. Difficile by PCR: POSITIVE — AB

## 2015-08-10 NOTE — Progress Notes (Addendum)
[place]  [phone] [phone]     ID: Heidi [name] OB: 11-12-1940  MR#: [id number]  PIR#:[id number]  Patient Care Team: [name], MD as PCP - General (Family Medicine)  CHIEF COMPLAINT/DIAGNOSIS:  Left breast Invasive Mammary Carcinoma with biopsy-proven left axillary lymph node metastasis. Ultrasound/mammogram on 07/27/14 and reported 10 x 10 x 9 mm mass in the left upper inner breast 9:00 position, multiple thickened left axillary lymph nodes largest measuring 8 x 6 mm. Ultrasound-guided core biopsy of the left breast mass with clip placement, and left axillary lymph node biopsy on 08/17/14 reported as consistent with invasive mammary carcinoma in both biopsies, ER positive (>90%), PR positive (>90%), HER-2/neu negative (was 2+ on IHC, FISH negative with Her2/CEP17 ratio of 1.18).  Clinically stage T1N1Mx (at least stage IIA)  -  patient got neoadjuvant chemotherapy. Completed AC x 4 (09/28/14 - 12/02/14). Then weekly Taxol (12/23/14 - 03/23/15).  05/07/15 - Left breast lumpectomy and SLN study. Surgical Pathology Report - DIAGNOSIS:  A. LEFT BREAST; NEEDLE LOCALIZED PARTIAL MASTECTOMY:  - INVASIVE MAMMARY CARCINOMA OF NO SPECIAL TYPE, 15 MM.  - DUCTAL CARCINOMA IN SITU, NUCLEAR GRADE 1, CRIBRIFORM TYPE.  - BIOPSY SITE CHANGES, MARKER CLIP PRESENT.  - MARGINS OF EXCISION ARE NEGATIVE, CLOSEST MARGIN TO INVASIVE CARCINOMA IS INFERIOR.  - SEE CANCER CASE SUMMARY BELOW.  B. SENTINEL LYMPH NODE; EXCISION:  - THREE OF FOUR SENTINEL LYMPH NODES INVOLVED BY METASTASES (3/4),  LARGEST TUMOR FOCUS MEASURES 7 MM.  C. SENTINEL LYMPH NODES; EXCISION:  - NO TUMOR SEEN IN TWO SENTINEL LYMPH NODES (0/2).    HISTORY OF PRESENT ILLNESS:  Patient returns for continued oncology followup, she is currently receiving radiation and is planned to complete this around November 3. Main complaint today is bothersome symptoms of peripheral neuropathy in both hands  and feet, with some difficulty in using hands and also feeling ground when she walks but denies imbalance or falls. States that she is taking gabapentin 400 mg every morning and none for the rest of the day. Otherwise chronic fatigue and dyspnea on exertion is unchanged. No new orthopnea or PND. No chest pain or new leg swelling. Appetite is steady. No nausea or vomiting.     REVIEW OF SYSTEMS:   ROS As in HPI above. In addition, no fever, chills. No new headaches or focal weakness. No sore throat or dysphagia. No dizziness or palpitation. No abdominal pain, constipation, diarrhea, dysuria or hematuria. No new skin rash or bleeding symptoms. No polyuria polydipsia. PS ECOG 1-2.  PAST MEDICAL HISTORY: Past Medical History  Diagnosis Date  . Hypertension   . Depression   . Hyperlipidemia   . Osteoarthritis   . Insomnia   . Cataracts, bilateral   . UTI (lower urinary tract infection)   . Anxiety   . COPD (chronic obstructive pulmonary disease) (HCC)     mild COPD  . Shortness of breath dyspnea   . Anemia   . Neuropathy due to chemotherapeutic drug ([place])   . Breast cancer ([place])     1990 right breast. 07/2014 left breast cancer    PAST SURGICAL HISTORY: Past Surgical History  Procedure Laterality Date  . Mastectomy Right     lower 1990  . Eye surgery Bilateral     cataract extractions  . Breast biopsy Left 05/07/2015    Procedure: BREAST BIOPSY WITH NEEDLE LOCALIZATION;  Surgeon: Dia Crawford III, MD;  Location: [facility] ORS;  Service: General;  Laterality: Left;  .  Breast lumpectomy with sentinel lymph node biopsy Left 05/07/2015    Procedure: PARTIAL MASTECTOMY WITH SENTINEL LYMPH NODE BX;  Surgeon: Dia Crawford III, MD;  Location: [facility] ORS;  Service: General;  Laterality: Left;    FAMILY HISTORY: Family History  Problem Relation Age of Onset  . Dementia Mother   . Bladder Cancer Father     SOCIAL HISTORY: Social History  Substance Use Topics  . Smoking status: Former Smoker -- 2.00  packs/day for 25 years    Types: Cigarettes    Quit date: 04/28/1992  . Smokeless tobacco: Never Used     Comment: Pt quit 1995  . Alcohol Use: No    Allergies  Allergen Reactions  . Diphenhydramine Other (See Comments)    pt states makes legs very restless and ache  . Hydrochloric Acid Hives    Other reaction(s): Blisters Uncoded Allergy. Allergen: HYDROCHLORLIC ACID.    Current Outpatient Prescriptions  Medication Sig Dispense Refill  . Fluticasone-Salmeterol (ADVAIR DISKUS) 250-50 MCG/DOSE AEPB Inhale 1 puff into the lungs 2 (two) times daily. GARGLE AND RINSE AFTER EACH USE. 60 each 3  . furosemide (LASIX) 40 MG tablet Take 1 tablet (40 mg total) by mouth daily as needed. (Patient taking differently: Take 40 mg by mouth daily. ) 30 tablet 3  . ibuprofen (ADVIL,MOTRIN) 200 MG tablet Take 200 mg by mouth every 6 (six) hours as needed.    Marland Kitchen losartan (COZAAR) 50 MG tablet Take by mouth.    . potassium chloride SA (K-DUR,KLOR-CON) 20 MEQ tablet Take 1 tablet (20 mEq total) by mouth daily as needed. (Patient taking differently: Take 20 mEq by mouth daily. ) 30 tablet 3  . PROAIR HFA 108 (90 BASE) MCG/ACT inhaler Inhale 2 puffs into the lungs every 6 (six) hours as needed.     . sertraline (ZOLOFT) 100 MG tablet Take 100 mg by mouth daily.    Marland Kitchen zolpidem (AMBIEN) 10 MG tablet Take 1 tablet (10 mg total) by mouth at bedtime. 30 tablet 1  . gabapentin (NEURONTIN) 100 MG capsule Take 3 capsules (300 mg total) by mouth 3 (three) times daily. 270 capsule 3   No current facility-administered medications for this visit.   Facility-Administered Medications Ordered in Other Visits  Medication Dose Route Frequency Provider Last Rate Last Dose  . sodium chloride 0.9 % injection 10 mL  10 mL Intravenous PRN Leia Alf, MD      . sodium chloride 0.9 % injection 10 mL  10 mL Intracatheter PRN Leia Alf, MD   10 mL at 03/16/15 1110  . sodium chloride 0.9 % injection 10 mL  10 mL Intravenous  PRN Leia Alf, MD   10 mL at 06/23/15 1236    OBJECTIVE: Filed Vitals:   08/04/15 1044  BP: 117/82  Pulse: 91  Temp: 97 F (36.1 C)  Resp: 18     Body mass index is 31.75 kg/(m^2).      GENERAL: Alert and oriented and in no acute distress. No icterus. HEENT: EOMs intact. No cervical lymphadenopathy. CVS: S1S2, regular LUNGS: Bilaterally clear to auscultation, no rhonchi. ABDOMEN: Soft, nontender. No hepatomegaly clinically.  NEURO: grossly nonfocal, cranial nerves are intact. Gait is slow but unremarkable. EXTREMITIES: No pedal edema.   LAB RESULTS:    Component Value Date/Time   NA 139 04/29/2015 0941   NA 136 02/19/2015 2136   K 4.2 04/29/2015 0941   K 4.0 02/19/2015 2136   CL 103 04/29/2015 0941   CL 104  02/19/2015 2136   CO2 25 04/29/2015 0941   CO2 25 02/19/2015 2136   GLUCOSE 119* 04/29/2015 0941   GLUCOSE 124* 02/19/2015 2136   BUN 17 04/29/2015 0941   BUN 11 02/19/2015 2136   CREATININE 0.66 08/04/2015 1005   CREATININE 0.46 02/19/2015 2136   CALCIUM 9.3 04/29/2015 0941   CALCIUM 8.5* 02/19/2015 2136   PROT 6.9 08/04/2015 1005   PROT 6.3* 02/19/2015 2136   ALBUMIN 3.9 08/04/2015 1005   ALBUMIN 3.8 02/19/2015 2136   AST 22 08/04/2015 1005   AST 21 02/19/2015 2136   ALT 19 08/04/2015 1005   ALT 19 02/19/2015 2136   ALKPHOS 66 08/04/2015 1005   ALKPHOS 78 02/19/2015 2136   BILITOT 0.5 08/04/2015 1005   BILITOT 0.8 02/19/2015 2136   GFRNONAA >60 08/04/2015 1005   GFRNONAA >60 02/19/2015 2136   GFRNONAA >60 11/23/2014 1541   GFRAA >60 08/04/2015 1005   GFRAA >60 02/19/2015 2136   GFRAA >60 11/23/2014 1541   Lab Results  Component Value Date   WBC 4.9 08/04/2015   NEUTROABS 3.4 08/04/2015   HGB 11.6* 08/04/2015   HCT 33.6* 08/04/2015   MCV 95.2 08/04/2015   PLT 250 08/04/2015   06/23/15 - serum CA 27-29 level normal at 31.9.  STUDIES: 05/07/15 - Left breast lumpectomy and SLN study. Surgical Pathology Report -  SPECIMEN SUBMITTED:  A.  Breast, left partial mastectomy. B. Lymph node, sentinel. C. Lymph nodes, sentinel. DIAGNOSIS:  A. LEFT BREAST; NEEDLE LOCALIZED PARTIAL MASTECTOMY:  - INVASIVE MAMMARY CARCINOMA OF NO SPECIAL TYPE, 15 MM.  - DUCTAL CARCINOMA IN SITU, NUCLEAR GRADE 1, CRIBRIFORM TYPE.  - BIOPSY SITE CHANGES, MARKER CLIP PRESENT.  - MARGINS OF EXCISION ARE NEGATIVE, CLOSEST MARGIN TO INVASIVE CARCINOMA IS INFERIOR.  - SEE CANCER CASE SUMMARY BELOW.  B. SENTINEL LYMPH NODE; EXCISION:  - THREE OF FOUR SENTINEL LYMPH NODES INVOLVED BY METASTASES (3/4),  LARGEST TUMOR FOCUS MEASURES 7 MM.  C. SENTINEL LYMPH NODES; EXCISION:  - NO TUMOR SEEN IN TWO SENTINEL LYMPH NODES (0/2).  INVASIVE CARCINOMA OF THE BREAST: Complete Excision (Less Than Total  Mastectomy, Including Specimens Designated Biopsy, Lumpectomy,  Quadrantectomy, and Partial Mastectomy With or Without Axillary  Contents) and Mastectomy (Total, Modified Radical, Radical With or  Without Axillary Contents)Radical, Radical With or Without Axillary  Contents)  Specimens InvolvedA: Breast, left partial mastectomy  Breast Invasive Carcinoma Cancer Case Summary  SPECIMEN  Procedure:   Excision with wire-guided localization  Lymph Node Sampling:   Sentinel lymph node(s)  Specimen Laterality:   Left  TUMOR  Presence of Invasive Carcinoma:  Histologic Type of Invasive  Carcinoma:  Invasive ductal carcinoma (no special type or not otherwise specified)  Histologic Grade: Nottingham Histologic Score  Glandular (Acinar) / Tubular Differentiation:  Score 1: > 75% of tumor area forming glandular / tubular structures  Nuclear Pleomorphism:  Score 1: Nuclei small with little increase in size in comparison with  normal breast epithelial cells, regular outlines, uniform nuclear  chromatin, little variation in size  Mitotic Rate  Score 1 (<=3 mitoses per mm2)  Overall Grade: Grade 1: scores of 3, 4 or 5  Ductal Carcinoma In Situ (DCIS):  DCIS is  present  EXTENT  Tumor Size / Focality:  Tumor Size: Size of Largest Invasive Carcinoma:  Greatest dimension of largest focus of invasion > 1 mm  Greatest Dimension (mm): 1.5cm  MARGINS  Invasive Carcinoma: Margins uninvolved by invasive carcinoma  Distance from Closest Margin (mm):  Distance (specify in mm)  62m  Ductal Carcinoma In Situ (DCIS):  Margins uninvolved by DCIS (DCIS  present in specimen)  Distance of DCIS from Closest Margin (mm):  Distance is > 10 mm  LYMPH NODES  Status of Lymph Nodes:  Total Number of Lymph Nodes Examined:  Specify 6  Micro / Macro Metastases:   Present  Number of Lymph Nodes with Macrometastases (> 2 mm): Specify 2  Number of Lymph Nodes with Micrometastases: Specify 1  Number of Sentinel Lymph Nodes Examined:   Specify 6  ACCESSORY FINDINGS  Lymph-Vascular Invasion:Not Identified  STAGE (pTNM)  TNM Descriptors:  y (post-treatment)  Primary Tumor (Invasive Carcinoma) (pT):  pT1c: Tumor > 10 mm but <= 20 mm in greatest dimension  Regional Lymph Nodes (pN) (choose a category based on lymph nodes  received with the specimen; immunohistochemistry and / or molecular  studies are not required)  Category (pN):  pN1a: Metastases in 1 to 3 axillary lymph nodes, at least 1 metastasis  greater than 2.0 mm##  Distant Metastasis (pM): N/A  Note:  Treatment Effect: Response to Presurgical (Neoadjuvant) Therapy  In the Breast: No definite response to presurgical therapy in the  invasive carcinoma  In the Lymph Nodes: No definite response to presurgical therapy in  metastatic carcinoma.  02/19/15 - CT chest. IMPRESSION:  1. Relatively small pulmonary embolus noted within a segmental branch to the right upper lung lobe. No additional pulmonary embolus seen. 2. Mild patchy atelectasis or scarring within both lower lobes. Minimal pneumonia cannot be excluded at the superior aspect of the right lower lobe. 3. Multiple thoracic vertebral body  hemangiomas incidentally noted.    ASSESSMENT / PLAN:   1. Left breast Invasive Mammary Carcinoma with biopsy-proven left axillary lymph node metastasis. Ultrasound/mammogram on 07/27/14 and reported 10 x 10 x 9 mm mass in the left upper inner breast 9:00 position, multiple thickened left axillary lymph nodes largest measuring 8 x 6 mm. Ultrasound-guided core biopsy of the left breast mass with clip placement, and left axillary lymph node biopsy on 08/17/14 reported as consistent with invasive mammary carcinoma in both biopsies, ER positive (>90%), PR positive (>90%), HER-2/neu negative (was 2+ on IHC, FISH negative with Her2/CEP17 ratio of 1.18).  Clinically stage T1N1M0 (at least stage IIA). Repeat mammogram/ultrasound of the left breast on February 16 indicated positive response to neoadjuvant chemotherapy with decrease in size of the mass which is now 7.5 x 6.6 x 5.6 mm, no enlarged axillary lymph nodes seen and also have decreased in size from the prior study. Got neoadjuvant chemotherapy (AC x 4, then weekly Taxol). Lumpectomy and sentinel lymph node study on 05/07/1959 showed persistent 15 mm tumor in the breast, persistent disease in lymph node  - patient is still receiving radiation, scheduled to complete on November 3. Have explained that given tumor is ER and PR positive, she would benefit from adjuvant hormonal therapy with aromatase inhibitors. Will get her to follow up with Dr.Brahmanday on 09/01/15 and plan adjuvant hormonal therapy if she has completed radiation, could consider screening for SWOG study at that time. 2. 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  3. Continue port flush every 6 weeks. 4. Dyspnea on exertion, small PE diagnosed 02/19/15 - patient is seeing Cardiology and  Pulmonary. Took  Eliquis. 5. In between visits, patient advised to call or come to ER in case of any worsening symptoms or acute sickness. She is agreeable to this plan.     Leia Alf, MD   08/10/2015 10:59 AM

## 2015-08-11 ENCOUNTER — Ambulatory Visit: Payer: Medicare Other

## 2015-08-11 ENCOUNTER — Ambulatory Visit
Admission: RE | Admit: 2015-08-11 | Discharge: 2015-08-11 | Disposition: A | Discharge status: Home / Self Care | Payer: Medicare Other | Source: Ambulatory Visit | Attending: Radiation Oncology | Admitting: Radiation Oncology

## 2015-08-12 ENCOUNTER — Ambulatory Visit
Admission: RE | Admit: 2015-08-12 | Discharge: 2015-08-12 | Disposition: A | Discharge status: Home / Self Care | Payer: Medicare Other | Source: Ambulatory Visit | Attending: Radiation Oncology | Admitting: Radiation Oncology

## 2015-08-12 DIAGNOSIS — Z510 Encounter for antineoplastic radiation therapy: Secondary | ICD-10-CM | POA: Diagnosis not present

## 2015-08-13 ENCOUNTER — Ambulatory Visit
Admission: RE | Admit: 2015-08-13 | Discharge: 2015-08-13 | Disposition: A | Discharge status: Home / Self Care | Payer: Medicare Other | Source: Ambulatory Visit | Attending: Radiation Oncology | Admitting: Radiation Oncology

## 2015-08-13 DIAGNOSIS — Z510 Encounter for antineoplastic radiation therapy: Secondary | ICD-10-CM | POA: Diagnosis not present

## 2015-08-16 ENCOUNTER — Ambulatory Visit
Admission: RE | Admit: 2015-08-16 | Discharge: 2015-08-16 | Disposition: A | Discharge status: Home / Self Care | Payer: Medicare Other | Source: Ambulatory Visit | Attending: Radiation Oncology | Admitting: Radiation Oncology

## 2015-08-16 DIAGNOSIS — Z510 Encounter for antineoplastic radiation therapy: Secondary | ICD-10-CM | POA: Diagnosis not present

## 2015-08-17 ENCOUNTER — Ambulatory Visit
Admission: RE | Admit: 2015-08-17 | Discharge: 2015-08-17 | Disposition: A | Discharge status: Home / Self Care | Payer: Medicare Other | Source: Ambulatory Visit | Attending: Radiation Oncology | Admitting: Radiation Oncology

## 2015-08-17 DIAGNOSIS — Z510 Encounter for antineoplastic radiation therapy: Secondary | ICD-10-CM | POA: Diagnosis not present

## 2015-08-18 ENCOUNTER — Ambulatory Visit
Admission: RE | Admit: 2015-08-18 | Discharge: 2015-08-18 | Disposition: A | Discharge status: Home / Self Care | Payer: Medicare Other | Source: Ambulatory Visit | Attending: Radiation Oncology | Admitting: Radiation Oncology

## 2015-08-18 DIAGNOSIS — Z510 Encounter for antineoplastic radiation therapy: Secondary | ICD-10-CM | POA: Diagnosis not present

## 2015-08-19 ENCOUNTER — Ambulatory Visit: Payer: Medicare Other

## 2015-08-19 ENCOUNTER — Ambulatory Visit
Admission: RE | Admit: 2015-08-19 | Discharge: 2015-08-19 | Disposition: A | Discharge status: Home / Self Care | Payer: Medicare Other | Source: Ambulatory Visit | Attending: Radiation Oncology | Admitting: Radiation Oncology

## 2015-08-19 ENCOUNTER — Inpatient Hospital Stay: Payer: Medicare Other

## 2015-08-19 DIAGNOSIS — C50212 Malignant neoplasm of upper-inner quadrant of left female breast: Secondary | ICD-10-CM | POA: Diagnosis not present

## 2015-08-19 DIAGNOSIS — Z510 Encounter for antineoplastic radiation therapy: Secondary | ICD-10-CM | POA: Diagnosis not present

## 2015-08-19 DIAGNOSIS — C50912 Malignant neoplasm of unspecified site of left female breast: Secondary | ICD-10-CM

## 2015-08-19 LAB — CBC
HEMATOCRIT: 33.9 % — AB (ref 35.0–47.0)
HEMOGLOBIN: 11.3 g/dL — AB (ref 12.0–16.0)
MCH: 31.9 pg (ref 26.0–34.0)
MCHC: 33.4 g/dL (ref 32.0–36.0)
MCV: 95.6 fL (ref 80.0–100.0)
Platelets: 329 10*3/uL (ref 150–440)
RBC: 3.55 MIL/uL — ABNORMAL LOW (ref 3.80–5.20)
RDW: 15.4 % — AB (ref 11.5–14.5)
WBC: 7.2 10*3/uL (ref 3.6–11.0)

## 2015-08-20 ENCOUNTER — Ambulatory Visit: Payer: Medicare Other

## 2015-08-20 ENCOUNTER — Ambulatory Visit
Admission: RE | Admit: 2015-08-20 | Discharge: 2015-08-20 | Disposition: A | Discharge status: Home / Self Care | Payer: Medicare Other | Source: Ambulatory Visit | Attending: Radiation Oncology | Admitting: Radiation Oncology

## 2015-08-20 DIAGNOSIS — Z510 Encounter for antineoplastic radiation therapy: Secondary | ICD-10-CM | POA: Diagnosis not present

## 2015-08-23 ENCOUNTER — Ambulatory Visit
Admission: RE | Admit: 2015-08-23 | Discharge: 2015-08-23 | Disposition: A | Discharge status: Home / Self Care | Payer: Medicare Other | Source: Ambulatory Visit | Attending: Radiation Oncology | Admitting: Radiation Oncology

## 2015-08-23 DIAGNOSIS — Z510 Encounter for antineoplastic radiation therapy: Secondary | ICD-10-CM | POA: Diagnosis not present

## 2015-08-24 ENCOUNTER — Ambulatory Visit
Admission: RE | Admit: 2015-08-24 | Discharge: 2015-08-24 | Disposition: A | Discharge status: Home / Self Care | Payer: Medicare Other | Source: Ambulatory Visit | Attending: Radiation Oncology | Admitting: Radiation Oncology

## 2015-08-24 ENCOUNTER — Inpatient Hospital Stay: Payer: Medicare Other

## 2015-08-24 ENCOUNTER — Ambulatory Visit: Admission: RE | Admit: 2015-08-24 | Payer: Medicare Other | Source: Ambulatory Visit

## 2015-08-24 DIAGNOSIS — C50912 Malignant neoplasm of unspecified site of left female breast: Secondary | ICD-10-CM

## 2015-08-24 DIAGNOSIS — Z510 Encounter for antineoplastic radiation therapy: Secondary | ICD-10-CM | POA: Diagnosis not present

## 2015-08-24 DIAGNOSIS — C50212 Malignant neoplasm of upper-inner quadrant of left female breast: Secondary | ICD-10-CM | POA: Diagnosis not present

## 2015-08-24 MED ORDER — SODIUM CHLORIDE 0.9 % IJ SOLN
10.0000 mL | Freq: Once | INTRAMUSCULAR | Status: AC
  Administered 2015-08-24: 10 mL via INTRAVENOUS
  Filled 2015-08-24: qty 10

## 2015-08-24 MED ORDER — HEPARIN SOD (PORK) LOCK FLUSH 100 UNIT/ML IV SOLN
INTRAVENOUS | Status: AC
  Filled 2015-08-24: qty 5

## 2015-08-24 MED ORDER — HEPARIN SOD (PORK) LOCK FLUSH 100 UNIT/ML IV SOLN
500.0000 [IU] | Freq: Once | INTRAVENOUS | Status: AC
  Administered 2015-08-24: 500 [IU] via INTRAVENOUS

## 2015-08-25 ENCOUNTER — Ambulatory Visit: Payer: Medicare Other

## 2015-08-25 DIAGNOSIS — Z510 Encounter for antineoplastic radiation therapy: Secondary | ICD-10-CM | POA: Diagnosis not present

## 2015-08-26 ENCOUNTER — Ambulatory Visit
Admission: RE | Admit: 2015-08-26 | Discharge: 2015-08-26 | Disposition: A | Discharge status: Home / Self Care | Payer: Medicare Other | Source: Ambulatory Visit | Attending: Radiation Oncology | Admitting: Radiation Oncology

## 2015-08-26 DIAGNOSIS — Z510 Encounter for antineoplastic radiation therapy: Secondary | ICD-10-CM | POA: Diagnosis not present

## 2015-08-27 ENCOUNTER — Ambulatory Visit
Admission: RE | Admit: 2015-08-27 | Discharge: 2015-08-27 | Disposition: A | Discharge status: Home / Self Care | Payer: Medicare Other | Source: Ambulatory Visit | Attending: Radiation Oncology | Admitting: Radiation Oncology

## 2015-08-27 DIAGNOSIS — Z510 Encounter for antineoplastic radiation therapy: Secondary | ICD-10-CM | POA: Diagnosis not present

## 2015-08-29 ENCOUNTER — Ambulatory Visit
Admission: RE | Admit: 2015-08-29 | Discharge: 2015-08-29 | Disposition: A | Discharge status: Home / Self Care | Payer: Medicare Other | Source: Ambulatory Visit | Attending: Radiation Oncology | Admitting: Radiation Oncology

## 2015-08-30 ENCOUNTER — Ambulatory Visit
Admission: RE | Admit: 2015-08-30 | Discharge: 2015-08-30 | Disposition: A | Discharge status: Home / Self Care | Payer: Medicare Other | Source: Ambulatory Visit | Attending: Radiation Oncology | Admitting: Radiation Oncology

## 2015-08-30 ENCOUNTER — Ambulatory Visit: Payer: Medicare Other

## 2015-08-30 DIAGNOSIS — Z510 Encounter for antineoplastic radiation therapy: Secondary | ICD-10-CM | POA: Diagnosis not present

## 2015-08-31 ENCOUNTER — Ambulatory Visit: Payer: Medicare Other

## 2015-08-31 ENCOUNTER — Ambulatory Visit
Admission: RE | Admit: 2015-08-31 | Discharge: 2015-08-31 | Disposition: A | Discharge status: Home / Self Care | Payer: Medicare Other | Source: Ambulatory Visit | Attending: Radiation Oncology | Admitting: Radiation Oncology

## 2015-08-31 ENCOUNTER — Other Ambulatory Visit: Payer: Self-pay | Admitting: *Deleted

## 2015-08-31 DIAGNOSIS — C50912 Malignant neoplasm of unspecified site of left female breast: Secondary | ICD-10-CM

## 2015-08-31 DIAGNOSIS — Z510 Encounter for antineoplastic radiation therapy: Secondary | ICD-10-CM | POA: Diagnosis not present

## 2015-09-01 ENCOUNTER — Inpatient Hospital Stay: Payer: Medicare Other | Attending: Internal Medicine

## 2015-09-01 ENCOUNTER — Encounter: Payer: Self-pay | Admitting: Internal Medicine

## 2015-09-01 ENCOUNTER — Ambulatory Visit
Admission: RE | Admit: 2015-09-01 | Discharge: 2015-09-01 | Disposition: A | Discharge status: Home / Self Care | Payer: Medicare Other | Source: Ambulatory Visit | Attending: Radiation Oncology | Admitting: Radiation Oncology

## 2015-09-01 ENCOUNTER — Inpatient Hospital Stay (HOSPITAL_BASED_OUTPATIENT_CLINIC_OR_DEPARTMENT_OTHER): Payer: Medicare Other | Admitting: Internal Medicine

## 2015-09-01 ENCOUNTER — Ambulatory Visit: Payer: Medicare Other

## 2015-09-01 VITALS — BP 113/73 | HR 84 | Temp 98.4°F | Resp 18 | Ht 68.0 in | Wt 205.0 lb

## 2015-09-01 DIAGNOSIS — L538 Other specified erythematous conditions: Secondary | ICD-10-CM

## 2015-09-01 DIAGNOSIS — G620 Drug-induced polyneuropathy: Secondary | ICD-10-CM | POA: Insufficient documentation

## 2015-09-01 DIAGNOSIS — I10 Essential (primary) hypertension: Secondary | ICD-10-CM | POA: Insufficient documentation

## 2015-09-01 DIAGNOSIS — Z9011 Acquired absence of right breast and nipple: Secondary | ICD-10-CM

## 2015-09-01 DIAGNOSIS — J449 Chronic obstructive pulmonary disease, unspecified: Secondary | ICD-10-CM | POA: Diagnosis not present

## 2015-09-01 DIAGNOSIS — Z79899 Other long term (current) drug therapy: Secondary | ICD-10-CM | POA: Insufficient documentation

## 2015-09-01 DIAGNOSIS — Z452 Encounter for adjustment and management of vascular access device: Secondary | ICD-10-CM | POA: Diagnosis not present

## 2015-09-01 DIAGNOSIS — C773 Secondary and unspecified malignant neoplasm of axilla and upper limb lymph nodes: Secondary | ICD-10-CM | POA: Diagnosis not present

## 2015-09-01 DIAGNOSIS — F418 Other specified anxiety disorders: Secondary | ICD-10-CM | POA: Diagnosis not present

## 2015-09-01 DIAGNOSIS — Z170 Estrogen receptor positive status [ER+]: Secondary | ICD-10-CM | POA: Insufficient documentation

## 2015-09-01 DIAGNOSIS — C50212 Malignant neoplasm of upper-inner quadrant of left female breast: Secondary | ICD-10-CM | POA: Diagnosis not present

## 2015-09-01 DIAGNOSIS — Z9221 Personal history of antineoplastic chemotherapy: Secondary | ICD-10-CM | POA: Insufficient documentation

## 2015-09-01 DIAGNOSIS — E785 Hyperlipidemia, unspecified: Secondary | ICD-10-CM

## 2015-09-01 DIAGNOSIS — Z510 Encounter for antineoplastic radiation therapy: Secondary | ICD-10-CM | POA: Diagnosis not present

## 2015-09-01 DIAGNOSIS — C50912 Malignant neoplasm of unspecified site of left female breast: Secondary | ICD-10-CM

## 2015-09-01 DIAGNOSIS — Z87891 Personal history of nicotine dependence: Secondary | ICD-10-CM | POA: Diagnosis not present

## 2015-09-01 DIAGNOSIS — T451X5S Adverse effect of antineoplastic and immunosuppressive drugs, sequela: Secondary | ICD-10-CM

## 2015-09-01 DIAGNOSIS — C50412 Malignant neoplasm of upper-outer quadrant of left female breast: Secondary | ICD-10-CM

## 2015-09-01 DIAGNOSIS — Z853 Personal history of malignant neoplasm of breast: Secondary | ICD-10-CM | POA: Diagnosis not present

## 2015-09-01 LAB — COMPREHENSIVE METABOLIC PANEL
ALK PHOS: 79 U/L (ref 38–126)
ALT: 13 U/L — AB (ref 14–54)
AST: 15 U/L (ref 15–41)
Albumin: 4 g/dL (ref 3.5–5.0)
Anion gap: 6 (ref 5–15)
BUN: 17 mg/dL (ref 6–20)
CALCIUM: 8.3 mg/dL — AB (ref 8.9–10.3)
CHLORIDE: 103 mmol/L (ref 101–111)
CO2: 26 mmol/L (ref 22–32)
CREATININE: 0.63 mg/dL (ref 0.44–1.00)
Glucose, Bld: 104 mg/dL — ABNORMAL HIGH (ref 65–99)
Potassium: 4 mmol/L (ref 3.5–5.1)
Sodium: 135 mmol/L (ref 135–145)
Total Bilirubin: 0.5 mg/dL (ref 0.3–1.2)
Total Protein: 7.1 g/dL (ref 6.5–8.1)

## 2015-09-01 LAB — CBC WITH DIFFERENTIAL/PLATELET
BASOS ABS: 0 10*3/uL (ref 0–0.1)
Basophils Relative: 0 %
Eosinophils Absolute: 0.1 10*3/uL (ref 0–0.7)
Eosinophils Relative: 2 %
HEMATOCRIT: 35 % (ref 35.0–47.0)
HEMOGLOBIN: 11.7 g/dL — AB (ref 12.0–16.0)
LYMPHS ABS: 0.4 10*3/uL — AB (ref 1.0–3.6)
LYMPHS PCT: 7 %
MCH: 32 pg (ref 26.0–34.0)
MCHC: 33.3 g/dL (ref 32.0–36.0)
MCV: 96.2 fL (ref 80.0–100.0)
Monocytes Absolute: 0.5 10*3/uL (ref 0.2–0.9)
Monocytes Relative: 8 %
NEUTROS ABS: 5.3 10*3/uL (ref 1.4–6.5)
NEUTROS PCT: 83 %
Platelets: 229 10*3/uL (ref 150–440)
RBC: 3.64 MIL/uL — AB (ref 3.80–5.20)
RDW: 14.7 % — ABNORMAL HIGH (ref 11.5–14.5)
WBC: 6.4 10*3/uL (ref 3.6–11.0)

## 2015-09-01 MED ORDER — LETROZOLE 2.5 MG PO TABS: 2.5000 mg | ORAL_TABLET | Freq: Every day | ORAL | Status: DC

## 2015-09-01 NOTE — Progress Notes (Signed)
Approximately 5 minutes spent educating patient on Femara.

## 2015-09-01 NOTE — Progress Notes (Signed)
[place] OFFICE PROGRESS NOTE  Patient Care Team: [name], MD as PCP - General (Family Medicine)   SUMMARY OF ONCOLOGIC HISTORY:  # LEFT BREAST CANCER STAGE II ER- 90%; PR-90%; Her 2 neu- NEG; NEO-ADJ CHEMO-  AC x4- Taxol x12 w;;s/p LUMPEC & SLNBx [ypT1 [1.5cm]; ypsN 3/6 (2 macro & 1 micro met)]  s/p RT [Nov 1st week;finish] NOV 2016- START FEMARA  # PN G-2 on neurontin; Hx PE on eliquis  INTERVAL HISTORY:  A very pleasant 74 year old female patient with above history of stage II breast cancer currently on postlumpectomy radiation is here for follow-up.  Patient admits to redness or erythema around the site of the radiation. Denies any extreme skin desquamation.  She does complain of tingling and numbness in her hands and feet. She has some difficulty with her daily activities/this improving. Continues to be Neurontin.   REVIEW OF SYSTEMS:  A complete 10 point review of system is done which is negative except mentioned above/history of present illness.   PAST MEDICAL HISTORY :  Past Medical History  Diagnosis Date  . Hypertension   . Depression   . Hyperlipidemia   . Osteoarthritis   . Insomnia   . Cataracts, bilateral   . UTI (lower urinary tract infection)   . Anxiety   . COPD (chronic obstructive pulmonary disease) (HCC)     mild COPD  . Shortness of breath dyspnea   . Anemia   . Neuropathy due to chemotherapeutic drug ([place])   . Breast cancer ([place])     1990 right breast. 07/2014 left breast cancer  . C. difficile diarrhea     see dr Vira Agar and this is her second course of vancomycin    PAST SURGICAL HISTORY :   Past Surgical History  Procedure Laterality Date  . Mastectomy Right     lower 1990  . Eye surgery Bilateral     cataract extractions  . Breast biopsy Left 05/07/2015    Procedure: BREAST BIOPSY WITH NEEDLE LOCALIZATION;  Surgeon: Dia Crawford III, MD;  Location: [facility] ORS;  Service: General;  Laterality: Left;  . Breast lumpectomy  with sentinel lymph node biopsy Left 05/07/2015    Procedure: PARTIAL MASTECTOMY WITH SENTINEL LYMPH NODE BX;  Surgeon: Dia Crawford III, MD;  Location: [facility] ORS;  Service: General;  Laterality: Left;    FAMILY HISTORY :   Family History  Problem Relation Age of Onset  . Dementia Mother   . Bladder Cancer Father     SOCIAL HISTORY:   Social History  Substance Use Topics  . Smoking status: Former Smoker -- 2.00 packs/day for 25 years    Types: Cigarettes    Quit date: 04/28/1992  . Smokeless tobacco: Never Used     Comment: Pt quit 1995  . Alcohol Use: No    ALLERGIES:  is allergic to diphenhydramine and hydrochloric acid.  MEDICATIONS:  Current Outpatient Prescriptions  Medication Sig Dispense Refill  . furosemide (LASIX) 40 MG tablet Take 1 tablet (40 mg total) by mouth daily as needed. (Patient taking differently: Take 40 mg by mouth daily. ) 30 tablet 3  . gabapentin (NEURONTIN) 100 MG capsule Take 3 capsules (300 mg total) by mouth 3 (three) times daily. 270 capsule 3  . losartan (COZAAR) 50 MG tablet Take by mouth.    . potassium chloride SA (K-DUR,KLOR-CON) 20 MEQ tablet Take 1 tablet (20 mEq total) by mouth daily as needed. (Patient taking differently: Take 20 mEq by mouth  daily. ) 30 tablet 3  . PROAIR HFA 108 (90 BASE) MCG/ACT inhaler Inhale 2 puffs into the lungs every 6 (six) hours as needed.     . sertraline (ZOLOFT) 100 MG tablet Take 100 mg by mouth daily.    Marland Kitchen zolpidem (AMBIEN) 10 MG tablet Take 1 tablet (10 mg total) by mouth at bedtime. 30 tablet 1  . Fluticasone-Salmeterol (ADVAIR DISKUS) 250-50 MCG/DOSE AEPB Inhale 1 puff into the lungs 2 (two) times daily. GARGLE AND RINSE AFTER EACH USE. (Patient not taking: Reported on 09/01/2015) 60 each 3  . ibuprofen (ADVIL,MOTRIN) 200 MG tablet Take 200 mg by mouth every 6 (six) hours as needed.     No current facility-administered medications for this visit.   Facility-Administered Medications Ordered in Other Visits   Medication Dose Route Frequency Provider Last Rate Last Dose  . sodium chloride 0.9 % injection 10 mL  10 mL Intravenous PRN Leia Alf, MD      . sodium chloride 0.9 % injection 10 mL  10 mL Intracatheter PRN Leia Alf, MD   10 mL at 03/16/15 1110  . sodium chloride 0.9 % injection 10 mL  10 mL Intravenous PRN Leia Alf, MD   10 mL at 06/23/15 1236    PHYSICAL EXAMINATION: ECOG PERFORMANCE STATUS: 1 - Symptomatic but completely ambulatory;  BP 113/73 mmHg  Pulse 84  Temp(Src) 98.4 F (36.9 C)  Resp 18  Ht '5\' 8"'  (1.727 m)  Wt 205 lb 0.4 oz (93 kg)  BMI 31.18 kg/m2  Filed Weights   09/01/15 1024  Weight: 205 lb 0.4 oz (93 kg)    GENERAL: Well-nourished well-developed; Alert, no distress and comfortable.   Alone. Walks with a cane.  EYES: no pallor or icterus OROPHARYNX: no thrush or ulceration; good dentition  NECK: supple, no masses felt LYMPH:  no palpable lymphadenopathy in the cervical, axillary or inguinal regions LUNGS: clear to auscultation and  No wheeze or crackles HEART/CVS: regular rate & rhythm and no murmurs; No lower extremity edema ABDOMEN:abdomen soft, non-tender and normal bowel sounds Musculoskeletal:no cyanosis of digits and no clubbing  PSYCH: alert & oriented x 3 with fluent speech NEURO: no focal motor/sensory deficits SKIN:  Erythema and mild warmth noted in the radiation portal Left breast exam- skin changes as described above; postlumpectomy changes noted.   LABORATORY DATA:  I have reviewed the data as listed    Component Value Date/Time   NA 135 09/01/2015 0942   NA 136 02/19/2015 2136   K 4.0 09/01/2015 0942   K 4.0 02/19/2015 2136   CL 103 09/01/2015 0942   CL 104 02/19/2015 2136   CO2 26 09/01/2015 0942   CO2 25 02/19/2015 2136   GLUCOSE 104* 09/01/2015 0942   GLUCOSE 124* 02/19/2015 2136   BUN 17 09/01/2015 0942   BUN 11 02/19/2015 2136   CREATININE 0.63 09/01/2015 0942   CREATININE 0.46 02/19/2015 2136   CALCIUM  8.3* 09/01/2015 0942   CALCIUM 8.5* 02/19/2015 2136   PROT 7.1 09/01/2015 0942   PROT 6.3* 02/19/2015 2136   ALBUMIN 4.0 09/01/2015 0942   ALBUMIN 3.8 02/19/2015 2136   AST 15 09/01/2015 0942   AST 21 02/19/2015 2136   ALT 13* 09/01/2015 0942   ALT 19 02/19/2015 2136   ALKPHOS 79 09/01/2015 0942   ALKPHOS 78 02/19/2015 2136   BILITOT 0.5 09/01/2015 0942   BILITOT 0.8 02/19/2015 2136   GFRNONAA >60 09/01/2015 0942   GFRNONAA >60 02/19/2015 2136  GFRNONAA >60 11/23/2014 1541   GFRAA >60 09/01/2015 0942   GFRAA >60 02/19/2015 2136   GFRAA >60 11/23/2014 1541    No results found for: SPEP, UPEP  Lab Results  Component Value Date   WBC 6.4 09/01/2015   NEUTROABS 5.3 09/01/2015   HGB 11.7* 09/01/2015   HCT 35.0 09/01/2015   MCV 96.2 09/01/2015   PLT 229 09/01/2015      Chemistry      Component Value Date/Time   NA 135 09/01/2015 0942   NA 136 02/19/2015 2136   K 4.0 09/01/2015 0942   K 4.0 02/19/2015 2136   CL 103 09/01/2015 0942   CL 104 02/19/2015 2136   CO2 26 09/01/2015 0942   CO2 25 02/19/2015 2136   BUN 17 09/01/2015 0942   BUN 11 02/19/2015 2136   CREATININE 0.63 09/01/2015 0942   CREATININE 0.46 02/19/2015 2136      Component Value Date/Time   CALCIUM 8.3* 09/01/2015 0942   CALCIUM 8.5* 02/19/2015 2136   ALKPHOS 79 09/01/2015 0942   ALKPHOS 78 02/19/2015 2136   AST 15 09/01/2015 0942   AST 21 02/19/2015 2136   ALT 13* 09/01/2015 0942   ALT 19 02/19/2015 2136   BILITOT 0.5 09/01/2015 0942   BILITOT 0.8 02/19/2015 2136       RADIOGRAPHIC STUDIES: I have personally reviewed the radiological images as listed and agreed with the findings in the report. No results found.   ASSESSMENT & PLAN:  # LEFT BREAST CANCER STAGE II ER/PR-POS; her 2 neu-NEG status post neoadjuvant chemotherapy currently on radiation. She is due to finish radiation this week. I reviewed the role of adjuvant aromatase inhibitor for 5-10 years. Given that at least 3 lymph nodes  positive noted on the final pathology- my recommendation would be for 10 years.  I discussed the potential side effects including but not limited to arthralgias or flashes and osteoporosis. Patient will start Femara in approximately one to 2 weeks. Patient is not a candidate for the SWOG trial [ [place]] because of time delay since diagnosis. Patient was given a new prescription for Femara. Also recommended take calcium and vitamin D.  # Peripheral neuropathy grade 2- on Neurontin improving. Continue the same.  # Follow the patient up in approximately 2 months/with labs.   No orders of the defined types were placed in this encounter.   All questions were answered. The patient knows to call the clinic with any problems, questions or concerns. No barriers to learning was detected.  I spent 25 minutes counseling the patient face to face. The total time spent in the appointment was 30 minutes and more than 50% was on counseling and review of test results     Cammie Sickle, MD 09/01/2015 10:48 AM

## 2015-09-01 NOTE — Progress Notes (Signed)
Pt still with neuropathy but she states it is better since the inc to 3 pills tid.  She still has it on her fingertips of first 3 fingers and then the whole finger on 4th and 5th.  Her feets numbness and tingling on bottom of foot, the toes and the insole on the side of both feet.  She has 3 more radiation visits before she is finished. Here to start AI.

## 2015-09-01 NOTE — Patient Instructions (Addendum)
Dr. Rogue Bussing has recommended that you start on Femera in 1 week. Attach you will find clinical information on this drug.  With this drug, you will need to take calcium 600 and vitamin D 1200. You can get this calcium and vitamin D supplement over the counter.  We will evaluate you in 2 months to see how well you are tolerating this drug.  Letrozole tablets What is this medicine? LETROZOLE (LET roe zole) blocks the production of estrogen. Certain types of breast cancer grow under the influence of estrogen. Letrozole helps block tumor growth. This medicine is used to treat advanced breast cancer in postmenopausal women. This medicine may be used for other purposes; ask your health care provider or pharmacist if you have questions. What should I tell my health care provider before I take this medicine? They need to know if you have any of these conditions: -liver disease -osteoporosis (weak bones) -an unusual or allergic reaction to letrozole, other medicines, foods, dyes, or preservatives -pregnant or trying to get pregnant -breast-feeding How should I use this medicine? Take this medicine by mouth with a glass of water. You may take it with or without food. Follow the directions on the prescription label. Take your medicine at regular intervals. Do not take your medicine more often than directed. Do not stop taking except on your doctor's advice. Talk to your pediatrician regarding the use of this medicine in children. Special care may be needed. Overdosage: If you think you have taken too much of this medicine contact a poison control center or emergency room at once. NOTE: This medicine is only for you. Do not share this medicine with others. What if I miss a dose? If you miss a dose, take it as soon as you can. If it is almost time for your next dose, take only that dose. Do not take double or extra doses. What may interact with this medicine? Do not take this medicine with any of the  following medications: -estrogens, like hormone replacement therapy or birth control pills This medicine may also interact with the following medications: -dietary supplements such as androstenedione or DHEA -prasterone -tamoxifen This list may not describe all possible interactions. Give your health care provider a list of all the medicines, herbs, non-prescription drugs, or dietary supplements you use. Also tell them if you smoke, drink alcohol, or use illegal drugs. Some items may interact with your medicine. What should I watch for while using this medicine? Visit your doctor or health care professional for regular check-ups to monitor your condition. Do not use this drug if you are pregnant. Serious side effects to an unborn child are possible. Talk to your doctor or pharmacist for more information. You may get drowsy or dizzy. Do not drive, use machinery, or do anything that needs mental alertness until you know how this medicine affects you. Do not stand or sit up quickly, especially if you are an older patient. This reduces the risk of dizzy or fainting spells. What side effects may I notice from receiving this medicine? Side effects that you should report to your doctor or health care professional as soon as possible: -allergic reactions like skin rash, itching, or hives -bone fracture -chest pain -difficulty breathing or shortness of breath -severe pain, swelling, warmth in the leg -unusually weak or tired -vaginal bleeding Side effects that usually do not require medical attention (report to your doctor or health care professional if they continue or are bothersome): -bone, back, joint, or muscle pain -  dizziness -fatigue -fluid retention -headache -hot flashes, night sweats -nausea -weight gain This list may not describe all possible side effects. Call your doctor for medical advice about side effects. You may report side effects to FDA at 1-800-FDA-1088. Where should I keep  my medicine? Keep out of the reach of children. Store between 15 and 30 degrees C (59 and 86 degrees F). Throw away any unused medicine after the expiration date. NOTE: This sheet is a summary. It may not cover all possible information. If you have questions about this medicine, talk to your doctor, pharmacist, or health care provider.    2016, [employer]vier/Gold Standard. (2007-12-27 16:43:44)

## 2015-09-02 ENCOUNTER — Ambulatory Visit
Admission: RE | Admit: 2015-09-02 | Discharge: 2015-09-02 | Disposition: A | Discharge status: Home / Self Care | Payer: Medicare Other | Source: Ambulatory Visit | Attending: Radiation Oncology | Admitting: Radiation Oncology

## 2015-09-02 ENCOUNTER — Ambulatory Visit: Payer: Medicare Other

## 2015-09-02 DIAGNOSIS — Z510 Encounter for antineoplastic radiation therapy: Secondary | ICD-10-CM | POA: Diagnosis not present

## 2015-09-03 ENCOUNTER — Ambulatory Visit
Admission: RE | Admit: 2015-09-03 | Discharge: 2015-09-03 | Disposition: A | Discharge status: Home / Self Care | Payer: Medicare Other | Source: Ambulatory Visit | Attending: Radiation Oncology | Admitting: Radiation Oncology

## 2015-09-03 DIAGNOSIS — Z510 Encounter for antineoplastic radiation therapy: Secondary | ICD-10-CM | POA: Diagnosis not present

## 2015-09-15 ENCOUNTER — Inpatient Hospital Stay: Payer: Medicare Other

## 2015-09-15 DIAGNOSIS — C801 Malignant (primary) neoplasm, unspecified: Secondary | ICD-10-CM

## 2015-09-15 DIAGNOSIS — C50212 Malignant neoplasm of upper-inner quadrant of left female breast: Secondary | ICD-10-CM | POA: Diagnosis not present

## 2015-09-15 MED ORDER — HEPARIN SOD (PORK) LOCK FLUSH 100 UNIT/ML IV SOLN
INTRAVENOUS | Status: AC
  Filled 2015-09-15: qty 5

## 2015-09-15 MED ORDER — SODIUM CHLORIDE 0.9 % IJ SOLN
10.0000 mL | INTRAMUSCULAR | Status: DC | PRN
  Administered 2015-09-15: 10 mL via INTRAVENOUS
  Filled 2015-09-15: qty 10

## 2015-09-15 MED ORDER — HEPARIN SOD (PORK) LOCK FLUSH 100 UNIT/ML IV SOLN
500.0000 [IU] | Freq: Once | INTRAVENOUS | Status: AC
  Administered 2015-09-15: 500 [IU] via INTRAVENOUS

## 2015-10-05 ENCOUNTER — Inpatient Hospital Stay: Payer: Medicare Other

## 2015-10-06 ENCOUNTER — Encounter: Payer: Self-pay | Admitting: Radiation Oncology

## 2015-10-06 ENCOUNTER — Ambulatory Visit
Admission: RE | Admit: 2015-10-06 | Discharge: 2015-10-06 | Disposition: A | Discharge status: Home / Self Care | Payer: Medicare Other | Source: Ambulatory Visit | Attending: Radiation Oncology | Admitting: Radiation Oncology

## 2015-10-06 VITALS — BP 140/74 | HR 103 | Temp 98.3°F | Resp 18 | Wt 210.2 lb

## 2015-10-06 DIAGNOSIS — C50411 Malignant neoplasm of upper-outer quadrant of right female breast: Secondary | ICD-10-CM

## 2015-10-06 NOTE — Progress Notes (Signed)
Radiation Oncology Follow up Note  Name: Heidi [name]   Date:   10/06/2015 MRN:  [id number] DOB: [birth year]    This 74 y.o. female presents to the clinic today for follow-up for left sided breast cancer stage IIa (T1 N1 M0) status post new adjuvant chemotherapy for ER/PR positive HER-2/neu negative disease. Status post ration therapy to her left breast and peripheral lymphatics.  REFERRING PROVIDER: [name], MD  HPI: Patient is a 74 year old female now out 1 month having completed radiation therapy to her left breast and peripheral lymphatics for stage IIa invasive mammary carcinoma (T1 N1 M0) status post new adjuvant chemotherapy than wide local excision and sentinel node biopsy and radiation therapy. She is seen today in routine follow-up and is doing well.. She specifically denies breast tenderness cough or bone pain. She's having no swelling in her left upper extremity. She is status post mastectomy of her right breast remotely. She is currently on Femara tolerating that well without side effect. Her only complaint is some peripheral neuropathy secondary to her Taxol treatment.  COMPLICATIONS OF TREATMENT: none  FOLLOW UP COMPLIANCE: keeps appointments   PHYSICAL EXAM:  BP 140/74 mmHg  Pulse 103  Temp(Src) 98.3 F (36.8 C)  Resp 18  Wt 210 lb 3.3 oz (95.35 kg) Well-developed female in NAD she status post right modified radical mastectomy no chest wall mass or nodularity is noted. Left breast shows no evidence of dominant mass or nodularity in 2 positions examined. No axillary or supraclavicular adenopathy is identified. No evidence of lymphedema of either extremity is noted. Well-developed well-nourished patient in NAD. HEENT reveals PERLA, EOMI, discs not visualized.  Oral cavity is clear. No oral mucosal lesions are identified. Neck is clear without evidence of cervical or supraclavicular adenopathy. Lungs are clear to A&P. Cardiac examination is essentially unremarkable with  regular rate and rhythm without murmur rub or thrill. Abdomen is benign with no organomegaly or masses noted. Motor sensory and DTR levels are equal and symmetric in the upper and lower extremities. Cranial nerves II through XII are grossly intact. Proprioception is intact. No peripheral adenopathy or edema is identified. No motor or sensory levels are noted. Crude visual fields are within normal range.  RADIOLOGY RESULTS: No current films for review  PLAN: Present time she is now 1 month out from left breast radiation doing extremely well without side effect or complaints. I am please were overall progress. She's currently on Femara tolerating that well without side effect or complaint. I've asked to see her back in 4-5 months for follow-up. She continues follow-up care with medical oncology.  I would like to take this opportunity for allowing me to participate in the care of your patient.Armstead Peaks., MD

## 2015-10-20 ENCOUNTER — Other Ambulatory Visit: Payer: Self-pay | Admitting: Internal Medicine

## 2015-10-20 ENCOUNTER — Telehealth: Payer: Self-pay | Admitting: *Deleted

## 2015-10-20 NOTE — Telephone Encounter (Signed)
Started on Letrozole about a month ago and it has flared her arthritis. Her knees, back, neck, shoulder all hurt and she is having leg cramps and cannot sleep at night so she is eating and gaining all the weight she lost.

## 2015-10-20 NOTE — Telephone Encounter (Signed)
Ask patient to hold the letrozole for 1 month and schedule an apt to see md in 1 month to assess symptoms per Dr. Rogue Bussing. I will enter an order for patient to be scheduled in 1 month.

## 2015-10-20 NOTE — Telephone Encounter (Signed)
PC to pt and informed to hold the Letrozole for 1 mionth, she asked what that meant, so I told do not take any more of it but do not throw it away and also that he wants so see her in 1 month to reevaluate. She asked if she is to keep appt for 1/9, I told her I would check on that, but probably would be rescheduled

## 2015-10-27 ENCOUNTER — Inpatient Hospital Stay: Payer: Medicare Other | Attending: Internal Medicine

## 2015-10-27 DIAGNOSIS — C50212 Malignant neoplasm of upper-inner quadrant of left female breast: Secondary | ICD-10-CM | POA: Diagnosis not present

## 2015-10-27 DIAGNOSIS — Z9221 Personal history of antineoplastic chemotherapy: Secondary | ICD-10-CM | POA: Diagnosis not present

## 2015-10-27 DIAGNOSIS — Z170 Estrogen receptor positive status [ER+]: Secondary | ICD-10-CM | POA: Insufficient documentation

## 2015-10-27 DIAGNOSIS — Z452 Encounter for adjustment and management of vascular access device: Secondary | ICD-10-CM | POA: Insufficient documentation

## 2015-10-27 DIAGNOSIS — C801 Malignant (primary) neoplasm, unspecified: Secondary | ICD-10-CM

## 2015-10-27 DIAGNOSIS — C773 Secondary and unspecified malignant neoplasm of axilla and upper limb lymph nodes: Secondary | ICD-10-CM | POA: Insufficient documentation

## 2015-10-27 MED ORDER — HEPARIN SOD (PORK) LOCK FLUSH 100 UNIT/ML IV SOLN
500.0000 [IU] | Freq: Once | INTRAVENOUS | Status: AC
  Administered 2015-10-27: 500 [IU] via INTRAVENOUS

## 2015-10-27 MED ORDER — SODIUM CHLORIDE 0.9 % IJ SOLN
10.0000 mL | INTRAMUSCULAR | Status: DC | PRN
  Administered 2015-10-27: 10 mL
  Filled 2015-10-27: qty 10

## 2015-11-08 ENCOUNTER — Ambulatory Visit: Payer: Self-pay | Admitting: Internal Medicine

## 2015-11-08 ENCOUNTER — Other Ambulatory Visit: Payer: Self-pay

## 2015-11-16 ENCOUNTER — Inpatient Hospital Stay: Payer: Medicare Other

## 2015-11-19 ENCOUNTER — Other Ambulatory Visit: Payer: Self-pay

## 2015-11-19 ENCOUNTER — Ambulatory Visit: Payer: Self-pay | Admitting: Internal Medicine

## 2015-11-19 ENCOUNTER — Telehealth: Payer: Self-pay | Admitting: Cardiovascular Disease

## 2015-11-19 NOTE — Telephone Encounter (Signed)
3 attempts to schedule fu from recall list. lmov to call office .  Deleting recall .   °

## 2015-11-22 ENCOUNTER — Inpatient Hospital Stay: Payer: Medicare Other

## 2015-11-22 ENCOUNTER — Other Ambulatory Visit: Payer: Self-pay

## 2015-11-22 ENCOUNTER — Ambulatory Visit: Payer: Self-pay | Admitting: Internal Medicine

## 2015-11-22 ENCOUNTER — Inpatient Hospital Stay: Payer: Medicare Other | Attending: Internal Medicine | Admitting: Internal Medicine

## 2015-11-22 ENCOUNTER — Encounter: Payer: Self-pay | Admitting: Internal Medicine

## 2015-11-22 VITALS — BP 149/83 | HR 89 | Temp 97.0°F | Resp 18 | Ht 68.0 in | Wt 220.5 lb

## 2015-11-22 DIAGNOSIS — T451X5S Adverse effect of antineoplastic and immunosuppressive drugs, sequela: Secondary | ICD-10-CM | POA: Diagnosis not present

## 2015-11-22 DIAGNOSIS — G620 Drug-induced polyneuropathy: Secondary | ICD-10-CM

## 2015-11-22 DIAGNOSIS — C50212 Malignant neoplasm of upper-inner quadrant of left female breast: Secondary | ICD-10-CM | POA: Insufficient documentation

## 2015-11-22 DIAGNOSIS — Z9221 Personal history of antineoplastic chemotherapy: Secondary | ICD-10-CM

## 2015-11-22 DIAGNOSIS — Z8052 Family history of malignant neoplasm of bladder: Secondary | ICD-10-CM | POA: Diagnosis not present

## 2015-11-22 DIAGNOSIS — Z79899 Other long term (current) drug therapy: Secondary | ICD-10-CM | POA: Insufficient documentation

## 2015-11-22 DIAGNOSIS — J449 Chronic obstructive pulmonary disease, unspecified: Secondary | ICD-10-CM | POA: Diagnosis not present

## 2015-11-22 DIAGNOSIS — Z79811 Long term (current) use of aromatase inhibitors: Secondary | ICD-10-CM

## 2015-11-22 DIAGNOSIS — Z170 Estrogen receptor positive status [ER+]: Secondary | ICD-10-CM | POA: Diagnosis not present

## 2015-11-22 DIAGNOSIS — M791 Myalgia: Secondary | ICD-10-CM | POA: Insufficient documentation

## 2015-11-22 DIAGNOSIS — Z87891 Personal history of nicotine dependence: Secondary | ICD-10-CM | POA: Diagnosis not present

## 2015-11-22 DIAGNOSIS — I10 Essential (primary) hypertension: Secondary | ICD-10-CM | POA: Diagnosis not present

## 2015-11-22 DIAGNOSIS — Z86711 Personal history of pulmonary embolism: Secondary | ICD-10-CM | POA: Diagnosis not present

## 2015-11-22 DIAGNOSIS — M1990 Unspecified osteoarthritis, unspecified site: Secondary | ICD-10-CM | POA: Diagnosis not present

## 2015-11-22 DIAGNOSIS — C773 Secondary and unspecified malignant neoplasm of axilla and upper limb lymph nodes: Secondary | ICD-10-CM | POA: Insufficient documentation

## 2015-11-22 DIAGNOSIS — Z923 Personal history of irradiation: Secondary | ICD-10-CM | POA: Diagnosis not present

## 2015-11-22 DIAGNOSIS — Z7901 Long term (current) use of anticoagulants: Secondary | ICD-10-CM | POA: Insufficient documentation

## 2015-11-22 DIAGNOSIS — C50412 Malignant neoplasm of upper-outer quadrant of left female breast: Secondary | ICD-10-CM

## 2015-11-22 DIAGNOSIS — E785 Hyperlipidemia, unspecified: Secondary | ICD-10-CM | POA: Diagnosis not present

## 2015-11-22 DIAGNOSIS — C50912 Malignant neoplasm of unspecified site of left female breast: Secondary | ICD-10-CM

## 2015-11-22 MED ORDER — GABAPENTIN 300 MG PO CAPS: 600.0000 mg | ORAL_CAPSULE | Freq: Two times a day (BID) | ORAL | Status: DC

## 2015-11-22 NOTE — Progress Notes (Signed)
[facility] OFFICE PROGRESS NOTE  Patient Care Team: [name], MD as PCP - General (Family Medicine)   SUMMARY OF ONCOLOGIC HISTORY:  # LEFT BREAST CANCER STAGE II ER- 90%; PR-90%; Her 2 neu- NEG; NEO-ADJ CHEMO-  AC x4- Taxol x12 w;;s/p LUMPEC & SLNBx [ypT1 [1.5cm]; ypsN 3/6 (2 macro & 1 micro met)]  s/p RT [Nov 1st week;finish] NOV 2016- START FEMARA  # PN G-2 on neurontin; Hx PE on eliquis  INTERVAL HISTORY:  A very pleasant 75 year old female patient with above history of stage II breast cancer currently on adjuvant femara. Stopped sec to side effects of- bodyaches muscle pain; joint pains. This also increased appetite-gaining weight. She stopped approximately a month ago; after taking for approximately month.   She is currently taking gabapentin 600 mg twice a day with improvement of her neuropathy.   REVIEW OF SYSTEMS:  A complete 10 point review of system is done which is negative except mentioned above/history of present illness.   PAST MEDICAL HISTORY :  Past Medical History  Diagnosis Date  . Hypertension   . Depression   . Hyperlipidemia   . Osteoarthritis   . Insomnia   . Cataracts, bilateral   . UTI (lower urinary tract infection)   . Anxiety   . COPD (chronic obstructive pulmonary disease) (H[facility])     mild COPD  . Shortness of breath dyspnea   . Anemia   . Neuropathy due to chemotherapeutic drug ([place])   . Breast cancer ([place])     1990 right breast. 07/2014 left breast cancer  . C. difficile diarrhea     see dr Vira Agar and this is her second course of vancomycin    PAST SURGICAL HISTORY :   Past Surgical History  Procedure Laterality Date  . Mastectomy Right     lower 1990  . Eye surgery Bilateral     cataract extractions  . Breast biopsy Left 05/07/2015    Procedure: BREAST BIOPSY WITH NEEDLE LOCALIZATION;  Surgeon: Dia Crawford III, MD;  Location: [facility] ORS;  Service: General;  Laterality: Left;  . Breast lumpectomy with sentinel lymph  node biopsy Left 05/07/2015    Procedure: PARTIAL MASTECTOMY WITH SENTINEL LYMPH NODE BX;  Surgeon: Dia Crawford III, MD;  Location: [facility] ORS;  Service: General;  Laterality: Left;    FAMILY HISTORY :   Family History  Problem Relation Age of Onset  . Dementia Mother   . Bladder Cancer Father     SOCIAL HISTORY:   Social History  Substance Use Topics  . Smoking status: Former Smoker -- 2.00 packs/day for 25 years    Types: Cigarettes    Quit date: 04/28/1992  . Smokeless tobacco: Never Used     Comment: Pt quit 1995  . Alcohol Use: No    ALLERGIES:  is allergic to diphenhydramine and hydrochloric acid.  MEDICATIONS:  Current Outpatient Prescriptions  Medication Sig Dispense Refill  . Calcium Carb-Cholecalciferol (CALCIUM 600 + D PO) Take 1 tablet by mouth daily.    . Fluticasone-Salmeterol (ADVAIR DISKUS) 250-50 MCG/DOSE AEPB Inhale 1 puff into the lungs 2 (two) times daily. GARGLE AND RINSE AFTER EACH USE. 60 each 3  . furosemide (LASIX) 40 MG tablet Take 1 tablet (40 mg total) by mouth daily as needed. (Patient taking differently: Take 40 mg by mouth daily. ) 30 tablet 3  . gabapentin (NEURONTIN) 100 MG capsule Take 3 capsules (300 mg total) by mouth 3 (three) times daily. (Patient taking differently:  Take 100 mg by mouth. 6 tablets in am and 6 tablets in pm.) 270 capsule 3  . ibuprofen (ADVIL,MOTRIN) 200 MG tablet Take 200 mg by mouth every 6 (six) hours as needed.    Marland Kitchen losartan (COZAAR) 50 MG tablet Take by mouth.    . potassium chloride SA (K-DUR,KLOR-CON) 20 MEQ tablet Take 1 tablet (20 mEq total) by mouth daily as needed. (Patient taking differently: Take 20 mEq by mouth daily. ) 30 tablet 3  . PROAIR HFA 108 (90 BASE) MCG/ACT inhaler Inhale 2 puffs into the lungs every 6 (six) hours as needed.     . sertraline (ZOLOFT) 100 MG tablet Take 100 mg by mouth daily.    Marland Kitchen zolpidem (AMBIEN) 10 MG tablet Take 1 tablet (10 mg total) by mouth at bedtime. 30 tablet 1  . letrozole (FEMARA)  2.5 MG tablet Take 1 tablet (2.5 mg total) by mouth daily. (Patient not taking: Reported on 11/22/2015) 90 tablet 3   No current facility-administered medications for this visit.   Facility-Administered Medications Ordered in Other Visits  Medication Dose Route Frequency Provider Last Rate Last Dose  . sodium chloride 0.9 % injection 10 mL  10 mL Intravenous PRN Leia Alf, MD      . sodium chloride 0.9 % injection 10 mL  10 mL Intracatheter PRN Leia Alf, MD   10 mL at 03/16/15 1110  . sodium chloride 0.9 % injection 10 mL  10 mL Intravenous PRN Leia Alf, MD   10 mL at 06/23/15 1236    PHYSICAL EXAMINATION: ECOG PERFORMANCE STATUS: 1 - Symptomatic but completely ambulatory;  BP 149/83 mmHg  Pulse 89  Temp(Src) 97 F (36.1 C) (Tympanic)  Resp 18  Ht '5\' 8"'  (1.727 m)  Wt 220 lb 7.4 oz (100 kg)  BMI 33.53 kg/m2  Filed Weights   11/22/15 1019  Weight: 220 lb 7.4 oz (100 kg)    GENERAL: Well-nourished well-developed; Alert, no distress and comfortable.   Alone.  EYES: no pallor or icterus OROPHARYNX: no thrush or ulceration; good dentition  NECK: supple, no masses felt LYMPH:  no palpable lymphadenopathy in the cervical, axillary or inguinal regions LUNGS: clear to auscultation and  No wheeze or crackles HEART/CVS: regular rate & rhythm and no murmurs; No lower extremity edema ABDOMEN:abdomen soft, non-tender and normal bowel sounds Musculoskeletal:no cyanosis of digits and no clubbing  PSYCH: alert & oriented x 3 with fluent speech NEURO: no focal motor/sensory deficits SKIN:  Erythema and mild warmth noted in the radiation portal Left breast exam- skin changes as described above; postlumpectomy changes noted.   LABORATORY DATA:  I have reviewed the data as listed    Component Value Date/Time   NA 135 09/01/2015 0942   NA 136 02/19/2015 2136   K 4.0 09/01/2015 0942   K 4.0 02/19/2015 2136   CL 103 09/01/2015 0942   CL 104 02/19/2015 2136   CO2 26  09/01/2015 0942   CO2 25 02/19/2015 2136   GLUCOSE 104* 09/01/2015 0942   GLUCOSE 124* 02/19/2015 2136   BUN 17 09/01/2015 0942   BUN 11 02/19/2015 2136   CREATININE 0.63 09/01/2015 0942   CREATININE 0.46 02/19/2015 2136   CALCIUM 8.3* 09/01/2015 0942   CALCIUM 8.5* 02/19/2015 2136   PROT 7.1 09/01/2015 0942   PROT 6.3* 02/19/2015 2136   ALBUMIN 4.0 09/01/2015 0942   ALBUMIN 3.8 02/19/2015 2136   AST 15 09/01/2015 0942   AST 21 02/19/2015 2136   ALT 13*  09/01/2015 0942   ALT 19 02/19/2015 2136   ALKPHOS 79 09/01/2015 0942   ALKPHOS 78 02/19/2015 2136   BILITOT 0.5 09/01/2015 0942   BILITOT 0.8 02/19/2015 2136   GFRNONAA >60 09/01/2015 0942   GFRNONAA >60 02/19/2015 2136   GFRNONAA >60 11/23/2014 1541   GFRAA >60 09/01/2015 0942   GFRAA >60 02/19/2015 2136   GFRAA >60 11/23/2014 1541    No results found for: SPEP, UPEP  Lab Results  Component Value Date   WBC 6.4 09/01/2015   NEUTROABS 5.3 09/01/2015   HGB 11.7* 09/01/2015   HCT 35.0 09/01/2015   MCV 96.2 09/01/2015   PLT 229 09/01/2015      Chemistry      Component Value Date/Time   NA 135 09/01/2015 0942   NA 136 02/19/2015 2136   K 4.0 09/01/2015 0942   K 4.0 02/19/2015 2136   CL 103 09/01/2015 0942   CL 104 02/19/2015 2136   CO2 26 09/01/2015 0942   CO2 25 02/19/2015 2136   BUN 17 09/01/2015 0942   BUN 11 02/19/2015 2136   CREATININE 0.63 09/01/2015 0942   CREATININE 0.46 02/19/2015 2136      Component Value Date/Time   CALCIUM 8.3* 09/01/2015 0942   CALCIUM 8.5* 02/19/2015 2136   ALKPHOS 79 09/01/2015 0942   ALKPHOS 78 02/19/2015 2136   AST 15 09/01/2015 0942   AST 21 02/19/2015 2136   ALT 13* 09/01/2015 0942   ALT 19 02/19/2015 2136   BILITOT 0.5 09/01/2015 0942   BILITOT 0.8 02/19/2015 2136       RADIOGRAPHIC STUDIES: I have personally reviewed the radiological images as listed and agreed with the findings in the report. No results found.   ASSESSMENT & PLAN:  # LEFT BREAST CANCER  STAGE II ER/PR-POS; her 2 neu-NEG status post neoadjuvant chemotherapy - status post radiation currently on adjuvant Femara. Patient stopped Femara approximately month ago because of intolerance-joint pains/myalgias.  # Recommend vitamin D 10,000 units 5 times a week; and also start exercising every day. Studies have shown that these maneuvers might help with the arthralgias from aromatase inhibitor. If this continues to be a problem than I recommend switching to an alternative Aromatase inhibitors.  # Recommend follow-up in one month/no labs reviewed. Review the labs and through PCP within normal limits.  # Peripheral neuropathy grade 2- on Neurontin improving. Continue Neurontin 300 mg 2 pills twice a day.  # 15 minutes face-to-face with the patient discussing the above plan of care; more than 50% of time spent on prognosis/ natural history; counseling and coordination.     Cammie Sickle, MD 11/22/2015 10:39 AM

## 2015-11-22 NOTE — Progress Notes (Signed)
Patient is requesting a prescription for 6 mastectomy bras and 1 breast prosthesis.

## 2015-12-08 ENCOUNTER — Inpatient Hospital Stay: Payer: Medicare Other | Attending: Internal Medicine

## 2015-12-08 DIAGNOSIS — M1990 Unspecified osteoarthritis, unspecified site: Secondary | ICD-10-CM | POA: Insufficient documentation

## 2015-12-08 DIAGNOSIS — Z923 Personal history of irradiation: Secondary | ICD-10-CM | POA: Insufficient documentation

## 2015-12-08 DIAGNOSIS — M791 Myalgia: Secondary | ICD-10-CM | POA: Diagnosis not present

## 2015-12-08 DIAGNOSIS — Z452 Encounter for adjustment and management of vascular access device: Secondary | ICD-10-CM | POA: Insufficient documentation

## 2015-12-08 DIAGNOSIS — T451X5S Adverse effect of antineoplastic and immunosuppressive drugs, sequela: Secondary | ICD-10-CM | POA: Diagnosis not present

## 2015-12-08 DIAGNOSIS — Z170 Estrogen receptor positive status [ER+]: Secondary | ICD-10-CM | POA: Diagnosis not present

## 2015-12-08 DIAGNOSIS — J449 Chronic obstructive pulmonary disease, unspecified: Secondary | ICD-10-CM | POA: Diagnosis not present

## 2015-12-08 DIAGNOSIS — C50212 Malignant neoplasm of upper-inner quadrant of left female breast: Secondary | ICD-10-CM | POA: Insufficient documentation

## 2015-12-08 DIAGNOSIS — Z95828 Presence of other vascular implants and grafts: Secondary | ICD-10-CM

## 2015-12-08 DIAGNOSIS — G620 Drug-induced polyneuropathy: Secondary | ICD-10-CM | POA: Insufficient documentation

## 2015-12-08 DIAGNOSIS — Z8052 Family history of malignant neoplasm of bladder: Secondary | ICD-10-CM | POA: Diagnosis not present

## 2015-12-08 DIAGNOSIS — Z9221 Personal history of antineoplastic chemotherapy: Secondary | ICD-10-CM | POA: Insufficient documentation

## 2015-12-08 DIAGNOSIS — E785 Hyperlipidemia, unspecified: Secondary | ICD-10-CM | POA: Insufficient documentation

## 2015-12-08 DIAGNOSIS — Z79899 Other long term (current) drug therapy: Secondary | ICD-10-CM | POA: Diagnosis not present

## 2015-12-08 DIAGNOSIS — Z87891 Personal history of nicotine dependence: Secondary | ICD-10-CM | POA: Diagnosis not present

## 2015-12-08 DIAGNOSIS — Z79811 Long term (current) use of aromatase inhibitors: Secondary | ICD-10-CM | POA: Diagnosis not present

## 2015-12-08 DIAGNOSIS — I10 Essential (primary) hypertension: Secondary | ICD-10-CM | POA: Diagnosis not present

## 2015-12-08 MED ORDER — SODIUM CHLORIDE 0.9% FLUSH
10.0000 mL | INTRAVENOUS | Status: DC | PRN
  Administered 2015-12-08: 10 mL via INTRAVENOUS
  Filled 2015-12-08: qty 10

## 2015-12-08 MED ORDER — HEPARIN SOD (PORK) LOCK FLUSH 100 UNIT/ML IV SOLN
500.0000 [IU] | Freq: Once | INTRAVENOUS | Status: AC
  Administered 2015-12-08: 500 [IU] via INTRAVENOUS

## 2015-12-17 ENCOUNTER — Telehealth: Payer: Self-pay | Admitting: *Deleted

## 2015-12-17 ENCOUNTER — Inpatient Hospital Stay (HOSPITAL_BASED_OUTPATIENT_CLINIC_OR_DEPARTMENT_OTHER): Payer: Medicare Other | Admitting: Internal Medicine

## 2015-12-17 VITALS — BP 138/83 | HR 93 | Temp 97.7°F | Ht 69.0 in | Wt 219.6 lb

## 2015-12-17 DIAGNOSIS — Z79899 Other long term (current) drug therapy: Secondary | ICD-10-CM

## 2015-12-17 DIAGNOSIS — J449 Chronic obstructive pulmonary disease, unspecified: Secondary | ICD-10-CM

## 2015-12-17 DIAGNOSIS — T451X5S Adverse effect of antineoplastic and immunosuppressive drugs, sequela: Secondary | ICD-10-CM

## 2015-12-17 DIAGNOSIS — Z9221 Personal history of antineoplastic chemotherapy: Secondary | ICD-10-CM | POA: Diagnosis not present

## 2015-12-17 DIAGNOSIS — I10 Essential (primary) hypertension: Secondary | ICD-10-CM

## 2015-12-17 DIAGNOSIS — Z79811 Long term (current) use of aromatase inhibitors: Secondary | ICD-10-CM | POA: Diagnosis not present

## 2015-12-17 DIAGNOSIS — M1990 Unspecified osteoarthritis, unspecified site: Secondary | ICD-10-CM

## 2015-12-17 DIAGNOSIS — C50212 Malignant neoplasm of upper-inner quadrant of left female breast: Secondary | ICD-10-CM

## 2015-12-17 DIAGNOSIS — M791 Myalgia: Secondary | ICD-10-CM

## 2015-12-17 DIAGNOSIS — E785 Hyperlipidemia, unspecified: Secondary | ICD-10-CM

## 2015-12-17 DIAGNOSIS — Z170 Estrogen receptor positive status [ER+]: Secondary | ICD-10-CM

## 2015-12-17 DIAGNOSIS — Z923 Personal history of irradiation: Secondary | ICD-10-CM

## 2015-12-17 DIAGNOSIS — G620 Drug-induced polyneuropathy: Secondary | ICD-10-CM

## 2015-12-17 MED ORDER — DICLOFENAC SODIUM 75 MG PO TBEC
DELAYED_RELEASE_TABLET | ORAL | Status: DC
Start: 2015-12-17 — End: 2017-02-14

## 2015-12-17 NOTE — Progress Notes (Signed)
[place] OFFICE PROGRESS NOTE  Patient Care Team: [name], MD as PCP - General (Family Medicine)   SUMMARY OF ONCOLOGIC HISTORY:  # LEFT BREAST CANCER STAGE II ER- 90%; PR-90%; Her 2 neu- NEG; NEO-ADJ CHEMO-  AC x4- Taxol x12 w;;s/p LUMPEC & SLNBx [ypT1 [1.5cm]; ypsN 3/6 (2 macro & 1 micro met)]  s/p RT [Nov 1st week;finish] NOV 2016- START FEMARA stop Feb 2017;   # PN G-2 on neurontin; Hx PE on eliquis  INTERVAL HISTORY:  A very pleasant 75 year old female patient with above history of stage II breast cancer currently on adjuvant femara. Patient on Femara noted to have worsening joint pains.Patient in vitamin D. No improvement noted with a joint pains. She is currently taking gabapentin 600 mg twice a day with improvement of her neuropathy.   REVIEW OF SYSTEMS:  A complete 10 point review of system is done which is negative except mentioned above/history of present illness.   PAST MEDICAL HISTORY :  Past Medical History  Diagnosis Date  . Hypertension   . Depression   . Hyperlipidemia   . Osteoarthritis   . Insomnia   . Cataracts, bilateral   . UTI (lower urinary tract infection)   . Anxiety   . COPD (chronic obstructive pulmonary disease) (HCC)     mild COPD  . Shortness of breath dyspnea   . Anemia   . Neuropathy due to chemotherapeutic drug ([place])   . Breast cancer ([place])     1990 right breast. 07/2014 left breast cancer  . C. difficile diarrhea     see dr Vira Agar and this is her second course of vancomycin    PAST SURGICAL HISTORY :   Past Surgical History  Procedure Laterality Date  . Mastectomy Right     lower 1990  . Eye surgery Bilateral     cataract extractions  . Breast biopsy Left 05/07/2015    Procedure: BREAST BIOPSY WITH NEEDLE LOCALIZATION;  Surgeon: Dia Crawford III, MD;  Location: [facility] ORS;  Service: General;  Laterality: Left;  . Breast lumpectomy with sentinel lymph node biopsy Left 05/07/2015    Procedure: PARTIAL MASTECTOMY  WITH SENTINEL LYMPH NODE BX;  Surgeon: Dia Crawford III, MD;  Location: [facility] ORS;  Service: General;  Laterality: Left;    FAMILY HISTORY :   Family History  Problem Relation Age of Onset  . Dementia Mother   . Bladder Cancer Father     SOCIAL HISTORY:   Social History  Substance Use Topics  . Smoking status: Former Smoker -- 2.00 packs/day for 25 years    Types: Cigarettes    Quit date: 04/28/1992  . Smokeless tobacco: Never Used     Comment: Pt quit 1995  . Alcohol Use: No    ALLERGIES:  is allergic to diphenhydramine and hydrochloric acid.  MEDICATIONS:  Current Outpatient Prescriptions  Medication Sig Dispense Refill  . Calcium Carb-Cholecalciferol (CALCIUM 600 + D PO) Take 1 tablet by mouth daily.    . furosemide (LASIX) 40 MG tablet Take 1 tablet (40 mg total) by mouth daily as needed. (Patient taking differently: Take 40 mg by mouth daily. ) 30 tablet 3  . gabapentin (NEURONTIN) 300 MG capsule Take 2 capsules (600 mg total) by mouth 2 (two) times daily. 120 capsule 4  . ibuprofen (ADVIL,MOTRIN) 200 MG tablet Take 200 mg by mouth every 6 (six) hours as needed.    Marland Kitchen letrozole (FEMARA) 2.5 MG tablet Take 1 tablet (2.5 mg total)  by mouth daily. 90 tablet 3  . losartan (COZAAR) 50 MG tablet Take by mouth.    . potassium chloride SA (K-DUR,KLOR-CON) 20 MEQ tablet Take 1 tablet (20 mEq total) by mouth daily as needed. (Patient taking differently: Take 20 mEq by mouth daily. ) 30 tablet 3  . PROAIR HFA 108 (90 BASE) MCG/ACT inhaler Inhale 2 puffs into the lungs every 6 (six) hours as needed.     . sertraline (ZOLOFT) 100 MG tablet Take 100 mg by mouth daily.    Marland Kitchen zolpidem (AMBIEN) 10 MG tablet Take 1 tablet (10 mg total) by mouth at bedtime. 30 tablet 1  . diclofenac (VOLTAREN) 75 MG EC tablet One pill every 12 hours as needed. 60 tablet 0  . Fluticasone-Salmeterol (ADVAIR DISKUS) 250-50 MCG/DOSE AEPB Inhale 1 puff into the lungs 2 (two) times daily. GARGLE AND RINSE AFTER EACH USE.  (Patient not taking: Reported on 12/17/2015) 60 each 3   No current facility-administered medications for this visit.   Facility-Administered Medications Ordered in Other Visits  Medication Dose Route Frequency Provider Last Rate Last Dose  . sodium chloride 0.9 % injection 10 mL  10 mL Intravenous PRN Leia Alf, MD      . sodium chloride 0.9 % injection 10 mL  10 mL Intracatheter PRN Leia Alf, MD   10 mL at 03/16/15 1110  . sodium chloride 0.9 % injection 10 mL  10 mL Intravenous PRN Leia Alf, MD   10 mL at 06/23/15 1236    PHYSICAL EXAMINATION: ECOG PERFORMANCE STATUS: 1 - Symptomatic but completely ambulatory;  BP 138/83 mmHg  Pulse 93  Temp(Src) 97.7 F (36.5 C) (Tympanic)  Ht _0  (1.753 m)  Wt 219 lb 9.3 oz (99.6 kg)  BMI 32.41 kg/m2  SpO2 95%  Filed Weights   12/17/15 1452 12/17/15 1456  Weight: 219 lb 9.3 oz (99.6 kg) 219 lb 9.3 oz (99.6 kg)    GENERAL: Well-nourished well-developed; Alert, no distress and comfortable.   Alone.  EYES: no pallor or icterus OROPHARYNX: no thrush or ulceration; good dentition  NECK: supple, no masses felt LYMPH:  no palpable lymphadenopathy in the cervical, axillary or inguinal regions LUNGS: clear to auscultation and  No wheeze or crackles HEART/CVS: regular rate & rhythm and no murmurs; No lower extremity edema ABDOMEN:abdomen soft, non-tender and normal bowel sounds Musculoskeletal:no cyanosis of digits and no clubbing  PSYCH: alert & oriented x 3 with fluent speech NEURO: no focal motor/sensory deficits SKIN:  Erythema and mild warmth noted in the radiation portal Left breast exam- skin changes as described above; postlumpectomy changes noted.   LABORATORY DATA:  I have reviewed the data as listed    Component Value Date/Time   NA 135 09/01/2015 0942   NA 136 02/19/2015 2136   K 4.0 09/01/2015 0942   K 4.0 02/19/2015 2136   CL 103 09/01/2015 0942   CL 104 02/19/2015 2136   CO2 26 09/01/2015 0942   CO2 25  02/19/2015 2136   GLUCOSE 104* 09/01/2015 0942   GLUCOSE 124* 02/19/2015 2136   BUN 17 09/01/2015 0942   BUN 11 02/19/2015 2136   CREATININE 0.63 09/01/2015 0942   CREATININE 0.46 02/19/2015 2136   CALCIUM 8.3* 09/01/2015 0942   CALCIUM 8.5* 02/19/2015 2136   PROT 7.1 09/01/2015 0942   PROT 6.3* 02/19/2015 2136   ALBUMIN 4.0 09/01/2015 0942   ALBUMIN 3.8 02/19/2015 2136   AST 15 09/01/2015 0942   AST 21 02/19/2015 2136  ALT 13* 09/01/2015 0942   ALT 19 02/19/2015 2136   ALKPHOS 79 09/01/2015 0942   ALKPHOS 78 02/19/2015 2136   BILITOT 0.5 09/01/2015 0942   BILITOT 0.8 02/19/2015 2136   GFRNONAA >60 09/01/2015 0942   GFRNONAA >60 02/19/2015 2136   GFRNONAA >60 11/23/2014 1541   GFRAA >60 09/01/2015 0942   GFRAA >60 02/19/2015 2136   GFRAA >60 11/23/2014 1541    No results found for: SPEP, UPEP  Lab Results  Component Value Date   WBC 6.4 09/01/2015   NEUTROABS 5.3 09/01/2015   HGB 11.7* 09/01/2015   HCT 35.0 09/01/2015   MCV 96.2 09/01/2015   PLT 229 09/01/2015      Chemistry      Component Value Date/Time   NA 135 09/01/2015 0942   NA 136 02/19/2015 2136   K 4.0 09/01/2015 0942   K 4.0 02/19/2015 2136   CL 103 09/01/2015 0942   CL 104 02/19/2015 2136   CO2 26 09/01/2015 0942   CO2 25 02/19/2015 2136   BUN 17 09/01/2015 0942   BUN 11 02/19/2015 2136   CREATININE 0.63 09/01/2015 0942   CREATININE 0.46 02/19/2015 2136      Component Value Date/Time   CALCIUM 8.3* 09/01/2015 0942   CALCIUM 8.5* 02/19/2015 2136   ALKPHOS 79 09/01/2015 0942   ALKPHOS 78 02/19/2015 2136   AST 15 09/01/2015 0942   AST 21 02/19/2015 2136   ALT 13* 09/01/2015 0942   ALT 19 02/19/2015 2136   BILITOT 0.5 09/01/2015 0942   BILITOT 0.8 02/19/2015 2136     ASSESSMENT & PLAN:  # LEFT BREAST CANCER STAGE II ER/PR-POS; her 2 neu-NEG status post neoadjuvant chemotherapy - status post radiation currently on adjuvant Femara. Patient experiencing significant joint pains on Femara.  Discontinue Femara.  # I recommend a follow-up in 6 weeks; we'll plan to restart her on aromatase inhibitor- likely Arimidex. No prescription given at this time.  # given the ongoing joint pains- with a plan for travel/[place]- new prescription for diclofenac given. However she understands that she will need to talk to her PCP regarding future prescriptions.  # 15 minutes face-to-face with the patient discussing the above plan of care; more than 50% of time spent on prognosis/ natural history; counseling and coordination.     Cammie Sickle, MD 12/17/2015 3:07 PM

## 2015-12-17 NOTE — Telephone Encounter (Signed)
md had sent new rx into [facility] and pt said that she no longer uses [facility] and I called [facility] and cancelled order and then called [facility] and called in the diclofenac 75 mg bid as needed , quantity 60 and no refills. Pt was in the office when I did all of this and she knows the rx is at [place].

## 2015-12-17 NOTE — Progress Notes (Signed)
Med rec updated, information provided by patient

## 2015-12-28 ENCOUNTER — Inpatient Hospital Stay: Payer: Medicare Other

## 2016-01-19 ENCOUNTER — Inpatient Hospital Stay: Payer: Medicare Other

## 2016-01-28 ENCOUNTER — Inpatient Hospital Stay: Payer: Medicare Other

## 2016-01-28 ENCOUNTER — Inpatient Hospital Stay: Payer: Medicare Other | Attending: Internal Medicine | Admitting: Internal Medicine

## 2016-01-28 VITALS — BP 135/67 | HR 84 | Temp 98.3°F | Resp 18 | Wt 224.6 lb

## 2016-01-28 DIAGNOSIS — Z9221 Personal history of antineoplastic chemotherapy: Secondary | ICD-10-CM | POA: Diagnosis not present

## 2016-01-28 DIAGNOSIS — C50212 Malignant neoplasm of upper-inner quadrant of left female breast: Secondary | ICD-10-CM | POA: Diagnosis not present

## 2016-01-28 DIAGNOSIS — Z923 Personal history of irradiation: Secondary | ICD-10-CM | POA: Insufficient documentation

## 2016-01-28 DIAGNOSIS — Z9011 Acquired absence of right breast and nipple: Secondary | ICD-10-CM | POA: Insufficient documentation

## 2016-01-28 DIAGNOSIS — J449 Chronic obstructive pulmonary disease, unspecified: Secondary | ICD-10-CM | POA: Insufficient documentation

## 2016-01-28 DIAGNOSIS — Z95828 Presence of other vascular implants and grafts: Secondary | ICD-10-CM

## 2016-01-28 DIAGNOSIS — E785 Hyperlipidemia, unspecified: Secondary | ICD-10-CM | POA: Insufficient documentation

## 2016-01-28 DIAGNOSIS — Z79811 Long term (current) use of aromatase inhibitors: Secondary | ICD-10-CM | POA: Insufficient documentation

## 2016-01-28 DIAGNOSIS — I10 Essential (primary) hypertension: Secondary | ICD-10-CM | POA: Diagnosis not present

## 2016-01-28 DIAGNOSIS — Z87891 Personal history of nicotine dependence: Secondary | ICD-10-CM

## 2016-01-28 DIAGNOSIS — Z86711 Personal history of pulmonary embolism: Secondary | ICD-10-CM | POA: Diagnosis not present

## 2016-01-28 DIAGNOSIS — Z8052 Family history of malignant neoplasm of bladder: Secondary | ICD-10-CM | POA: Insufficient documentation

## 2016-01-28 DIAGNOSIS — Z170 Estrogen receptor positive status [ER+]: Secondary | ICD-10-CM

## 2016-01-28 DIAGNOSIS — Z853 Personal history of malignant neoplasm of breast: Secondary | ICD-10-CM | POA: Diagnosis not present

## 2016-01-28 DIAGNOSIS — Z7901 Long term (current) use of anticoagulants: Secondary | ICD-10-CM | POA: Diagnosis not present

## 2016-01-28 DIAGNOSIS — C50912 Malignant neoplasm of unspecified site of left female breast: Secondary | ICD-10-CM

## 2016-01-28 MED ORDER — ANASTROZOLE 1 MG PO TABS: 1.0000 mg | ORAL_TABLET | Freq: Every day | ORAL | Status: DC

## 2016-01-28 MED ORDER — HEPARIN SOD (PORK) LOCK FLUSH 100 UNIT/ML IV SOLN
500.0000 [IU] | Freq: Once | INTRAVENOUS | Status: AC
  Administered 2016-01-28: 500 [IU] via INTRAVENOUS

## 2016-01-28 MED ORDER — SODIUM CHLORIDE 0.9% FLUSH
10.0000 mL | INTRAVENOUS | Status: DC | PRN
  Administered 2016-01-28: 10 mL via INTRAVENOUS
  Filled 2016-01-28: qty 10

## 2016-01-28 NOTE — Progress Notes (Signed)
[place] OFFICE PROGRESS NOTE  Patient Care Team: [name], MD as PCP - General (Family Medicine)   SUMMARY OF ONCOLOGIC HISTORY:  # march 2016- LEFT BREAST CANCER STAGE II ER- 90%; PR-90%; Her 2 neu- NEG; NEO-ADJ CHEMO-  AC x4- Taxol x12 w;;s/p LUMPEC & SLNBx [ypT1 [1.5cm]; ypsN 3/6 (2 macro & 1 micro met)]  s/p RT [Nov 1st week;finish] NOV 2016- START FEMARA stop Feb 2017; April 2017- start Arimidex  # PN G-2 on neurontin; Hx PE on eliquis  # Mediport  INTERVAL HISTORY:  A very pleasant 75 year old female patient with above history of stage II breast cancer currently on adjuvant femara.  Femara is on hold for the last 6 weeks.   She noted to have some improvement of her  Joint pains however not completely resolved.  She has recently  To return from a trip from [place]. She had a good vacation.    Patient is on vitamin D and also calcium.   She continues to complain of intermittent pain in her small joints.  REVIEW OF SYSTEMS:  A complete 10 point review of system is done which is negative except mentioned above/history of present illness.   PAST MEDICAL HISTORY :  Past Medical History  Diagnosis Date  . Hypertension   . Depression   . Hyperlipidemia   . Osteoarthritis   . Insomnia   . Cataracts, bilateral   . UTI (lower urinary tract infection)   . Anxiety   . COPD (chronic obstructive pulmonary disease) (HCC)     mild COPD  . Shortness of breath dyspnea   . Anemia   . Neuropathy due to chemotherapeutic drug ([place])   . Breast cancer ([place])     1990 right breast. 07/2014 left breast cancer  . C. difficile diarrhea     see dr Vira Agar and this is her second course of vancomycin    PAST SURGICAL HISTORY :   Past Surgical History  Procedure Laterality Date  . Mastectomy Right     lower 1990  . Eye surgery Bilateral     cataract extractions  . Breast biopsy Left 05/07/2015    Procedure: BREAST BIOPSY WITH NEEDLE LOCALIZATION;  Surgeon:  Dia Crawford III, MD;  Location: [facility] ORS;  Service: General;  Laterality: Left;  . Breast lumpectomy with sentinel lymph node biopsy Left 05/07/2015    Procedure: PARTIAL MASTECTOMY WITH SENTINEL LYMPH NODE BX;  Surgeon: Dia Crawford III, MD;  Location: [facility] ORS;  Service: General;  Laterality: Left;    FAMILY HISTORY :   Family History  Problem Relation Age of Onset  . Dementia Mother   . Bladder Cancer Father     SOCIAL HISTORY:   Social History  Substance Use Topics  . Smoking status: Former Smoker -- 2.00 packs/day for 25 years    Types: Cigarettes    Quit date: 04/28/1992  . Smokeless tobacco: Never Used     Comment: Pt quit 1995  . Alcohol Use: No    ALLERGIES:  is allergic to diphenhydramine and hydrochloric acid.  MEDICATIONS:  Current Outpatient Prescriptions  Medication Sig Dispense Refill  . Calcium Carb-Cholecalciferol (CALCIUM 600 + D PO) Take 1 tablet by mouth daily.    . diclofenac (VOLTAREN) 75 MG EC tablet One pill every 12 hours as needed. 60 tablet 0  . Fluticasone-Salmeterol (ADVAIR DISKUS) 250-50 MCG/DOSE AEPB Inhale 1 puff into the lungs 2 (two) times daily. GARGLE AND RINSE AFTER EACH USE. 60 each  3  . furosemide (LASIX) 40 MG tablet Take 1 tablet (40 mg total) by mouth daily as needed. (Patient taking differently: Take 40 mg by mouth daily. ) 30 tablet 3  . gabapentin (NEURONTIN) 300 MG capsule Take 2 capsules (600 mg total) by mouth 2 (two) times daily. 120 capsule 4  . ibuprofen (ADVIL,MOTRIN) 200 MG tablet Take 200 mg by mouth every 6 (six) hours as needed.    Marland Kitchen letrozole (FEMARA) 2.5 MG tablet Take 1 tablet (2.5 mg total) by mouth daily. 90 tablet 3  . losartan (COZAAR) 50 MG tablet Take by mouth.    . potassium chloride SA (K-DUR,KLOR-CON) 20 MEQ tablet Take 1 tablet (20 mEq total) by mouth daily as needed. (Patient taking differently: Take 20 mEq by mouth daily. ) 30 tablet 3  . PROAIR HFA 108 (90 BASE) MCG/ACT inhaler Inhale 2 puffs into the lungs every 6  (six) hours as needed.     . sertraline (ZOLOFT) 100 MG tablet Take 100 mg by mouth daily.    Marland Kitchen zolpidem (AMBIEN) 10 MG tablet Take 1 tablet (10 mg total) by mouth at bedtime. 30 tablet 1   No current facility-administered medications for this visit.   Facility-Administered Medications Ordered in Other Visits  Medication Dose Route Frequency Provider Last Rate Last Dose  . sodium chloride 0.9 % injection 10 mL  10 mL Intravenous PRN Leia Alf, MD      . sodium chloride 0.9 % injection 10 mL  10 mL Intracatheter PRN Leia Alf, MD   10 mL at 03/16/15 1110  . sodium chloride 0.9 % injection 10 mL  10 mL Intravenous PRN Leia Alf, MD   10 mL at 06/23/15 1236  . sodium chloride flush (NS) 0.9 % injection 10 mL  10 mL Intravenous PRN Cammie Sickle, MD   10 mL at 01/28/16 1421    PHYSICAL EXAMINATION: ECOG PERFORMANCE STATUS: 1 - Symptomatic but completely ambulatory;  BP 135/67 mmHg  Pulse 84  Temp(Src) 98.3 F (36.8 C) (Tympanic)  Resp 18  Wt 224 lb 10.4 oz (101.9 kg)  Filed Weights   01/28/16 1501  Weight: 224 lb 10.4 oz (101.9 kg)    GENERAL: Well-nourished well-developed; Alert, no distress and comfortable.   Alone.  EYES: no pallor or icterus OROPHARYNX: no thrush or ulceration; good dentition  NECK: supple, no masses felt LYMPH:  no palpable lymphadenopathy in the cervical, axillary or inguinal regions LUNGS: clear to auscultation and  No wheeze or crackles HEART/CVS: regular rate & rhythm and no murmurs; No lower extremity edema ABDOMEN:abdomen soft, non-tender and normal bowel sounds Musculoskeletal:no cyanosis of digits and no clubbing  PSYCH: alert & oriented x 3 with fluent speech NEURO: no focal motor/sensory deficits SKIN:  Erythema and mild warmth noted in the radiation portal Left breast exam- skin changes as described above; postlumpectomy changes noted.   LABORATORY DATA:  I have reviewed the data as listed    Component Value Date/Time    NA 135 09/01/2015 0942   NA 136 02/19/2015 2136   K 4.0 09/01/2015 0942   K 4.0 02/19/2015 2136   CL 103 09/01/2015 0942   CL 104 02/19/2015 2136   CO2 26 09/01/2015 0942   CO2 25 02/19/2015 2136   GLUCOSE 104* 09/01/2015 0942   GLUCOSE 124* 02/19/2015 2136   BUN 17 09/01/2015 0942   BUN 11 02/19/2015 2136   CREATININE 0.63 09/01/2015 0942   CREATININE 0.46 02/19/2015 2136   CALCIUM 8.3*  09/01/2015 0942   CALCIUM 8.5* 02/19/2015 2136   PROT 7.1 09/01/2015 0942   PROT 6.3* 02/19/2015 2136   ALBUMIN 4.0 09/01/2015 0942   ALBUMIN 3.8 02/19/2015 2136   AST 15 09/01/2015 0942   AST 21 02/19/2015 2136   ALT 13* 09/01/2015 0942   ALT 19 02/19/2015 2136   ALKPHOS 79 09/01/2015 0942   ALKPHOS 78 02/19/2015 2136   BILITOT 0.5 09/01/2015 0942   BILITOT 0.8 02/19/2015 2136   GFRNONAA >60 09/01/2015 0942   GFRNONAA >60 02/19/2015 2136   GFRNONAA >60 11/23/2014 1541   GFRAA >60 09/01/2015 0942   GFRAA >60 02/19/2015 2136   GFRAA >60 11/23/2014 1541    No results found for: SPEP, UPEP  Lab Results  Component Value Date   WBC 6.4 09/01/2015   NEUTROABS 5.3 09/01/2015   HGB 11.7* 09/01/2015   HCT 35.0 09/01/2015   MCV 96.2 09/01/2015   PLT 229 09/01/2015      Chemistry      Component Value Date/Time   NA 135 09/01/2015 0942   NA 136 02/19/2015 2136   K 4.0 09/01/2015 0942   K 4.0 02/19/2015 2136   CL 103 09/01/2015 0942   CL 104 02/19/2015 2136   CO2 26 09/01/2015 0942   CO2 25 02/19/2015 2136   BUN 17 09/01/2015 0942   BUN 11 02/19/2015 2136   CREATININE 0.63 09/01/2015 0942   CREATININE 0.46 02/19/2015 2136      Component Value Date/Time   CALCIUM 8.3* 09/01/2015 0942   CALCIUM 8.5* 02/19/2015 2136   ALKPHOS 79 09/01/2015 0942   ALKPHOS 78 02/19/2015 2136   AST 15 09/01/2015 0942   AST 21 02/19/2015 2136   ALT 13* 09/01/2015 0942   ALT 19 02/19/2015 2136   BILITOT 0.5 09/01/2015 0942   BILITOT 0.8 02/19/2015 2136     ASSESSMENT & PLAN:  # LEFT BREAST  CANCER STAGE II ER/PR-POS; her 2 neu-NEG status post neoadjuvant chemotherapy - status post radiation currently on adjuvant Femara.   Femara on hold for the last 6 weeks-  Improvement noted.  #  I recommended a trial of  Arimidex. Patient is taking 25,000 units of vitamin D A day.  #  Discussed regarding the port-  Decided to keep the port for now. She'll continue port flushes.  #  Follow-up with me in 6 weeks  To evaluate the side effects of Arimidex. No labs.      Cammie Sickle, MD 01/28/2016 3:19 PM

## 2016-01-28 NOTE — Progress Notes (Signed)
Patient states she is having increased neuropathy in her fingers.  Patient states when she closes her hand some of her fingers are locking up when she tries to open her hand.

## 2016-02-08 ENCOUNTER — Inpatient Hospital Stay: Payer: Medicare Other

## 2016-03-01 ENCOUNTER — Inpatient Hospital Stay: Payer: Medicare Other

## 2016-03-10 ENCOUNTER — Inpatient Hospital Stay: Payer: Medicare Other

## 2016-03-10 ENCOUNTER — Inpatient Hospital Stay: Payer: Medicare Other | Attending: Internal Medicine | Admitting: Internal Medicine

## 2016-03-10 VITALS — BP 144/83 | HR 97 | Temp 96.5°F | Resp 18 | Wt 222.2 lb

## 2016-03-10 DIAGNOSIS — Z87891 Personal history of nicotine dependence: Secondary | ICD-10-CM | POA: Insufficient documentation

## 2016-03-10 DIAGNOSIS — F418 Other specified anxiety disorders: Secondary | ICD-10-CM | POA: Insufficient documentation

## 2016-03-10 DIAGNOSIS — Z853 Personal history of malignant neoplasm of breast: Secondary | ICD-10-CM | POA: Insufficient documentation

## 2016-03-10 DIAGNOSIS — Z86711 Personal history of pulmonary embolism: Secondary | ICD-10-CM | POA: Diagnosis not present

## 2016-03-10 DIAGNOSIS — G620 Drug-induced polyneuropathy: Secondary | ICD-10-CM | POA: Insufficient documentation

## 2016-03-10 DIAGNOSIS — Z452 Encounter for adjustment and management of vascular access device: Secondary | ICD-10-CM | POA: Diagnosis not present

## 2016-03-10 DIAGNOSIS — E785 Hyperlipidemia, unspecified: Secondary | ICD-10-CM | POA: Insufficient documentation

## 2016-03-10 DIAGNOSIS — Z79899 Other long term (current) drug therapy: Secondary | ICD-10-CM | POA: Diagnosis not present

## 2016-03-10 DIAGNOSIS — Z7901 Long term (current) use of anticoagulants: Secondary | ICD-10-CM | POA: Diagnosis not present

## 2016-03-10 DIAGNOSIS — J449 Chronic obstructive pulmonary disease, unspecified: Secondary | ICD-10-CM | POA: Insufficient documentation

## 2016-03-10 DIAGNOSIS — T451X5S Adverse effect of antineoplastic and immunosuppressive drugs, sequela: Secondary | ICD-10-CM | POA: Diagnosis not present

## 2016-03-10 DIAGNOSIS — Z79811 Long term (current) use of aromatase inhibitors: Secondary | ICD-10-CM | POA: Diagnosis not present

## 2016-03-10 DIAGNOSIS — Z170 Estrogen receptor positive status [ER+]: Secondary | ICD-10-CM

## 2016-03-10 DIAGNOSIS — C50412 Malignant neoplasm of upper-outer quadrant of left female breast: Secondary | ICD-10-CM

## 2016-03-10 DIAGNOSIS — M1990 Unspecified osteoarthritis, unspecified site: Secondary | ICD-10-CM | POA: Diagnosis not present

## 2016-03-10 DIAGNOSIS — Z9221 Personal history of antineoplastic chemotherapy: Secondary | ICD-10-CM | POA: Insufficient documentation

## 2016-03-10 DIAGNOSIS — C50912 Malignant neoplasm of unspecified site of left female breast: Secondary | ICD-10-CM | POA: Insufficient documentation

## 2016-03-10 DIAGNOSIS — I10 Essential (primary) hypertension: Secondary | ICD-10-CM | POA: Insufficient documentation

## 2016-03-10 DIAGNOSIS — Z8052 Family history of malignant neoplasm of bladder: Secondary | ICD-10-CM | POA: Insufficient documentation

## 2016-03-10 DIAGNOSIS — Z9011 Acquired absence of right breast and nipple: Secondary | ICD-10-CM | POA: Insufficient documentation

## 2016-03-10 DIAGNOSIS — Z95828 Presence of other vascular implants and grafts: Secondary | ICD-10-CM

## 2016-03-10 MED ORDER — HEPARIN SOD (PORK) LOCK FLUSH 100 UNIT/ML IV SOLN
500.0000 [IU] | Freq: Once | INTRAVENOUS | Status: AC
  Administered 2016-03-10: 500 [IU] via INTRAVENOUS

## 2016-03-10 MED ORDER — SODIUM CHLORIDE 0.9% FLUSH
10.0000 mL | INTRAVENOUS | Status: DC | PRN
  Administered 2016-03-10: 10 mL via INTRAVENOUS
  Filled 2016-03-10: qty 10

## 2016-03-10 NOTE — Progress Notes (Signed)
[place] OFFICE PROGRESS NOTE  Patient Care Team: [name], MD as PCP - General (Family Medicine)   SUMMARY OF ONCOLOGIC HISTORY:  # March 2016- LEFT BREAST CANCER STAGE II ER- 90%; PR-90%; Her 2 neu- NEG; NEO-ADJ CHEMO-  AC x4- Taxol x12 w;;s/p LUMPEC & SLNBx [ypT1 [1.5cm]; ypsN 3/6 (2 macro & 1 micro met)]  s/p RT [Nov 1st week;finish] NOV 2016- START FEMARA stop Feb 2017; April 2017- start Arimidex  # Right Breast cancer ? Stage;  s/p Mastec [1990;s/p Chemo; RT ]  # PN G-2 on neurontin; Hx PE on eliquis  # Mediport  INTERVAL HISTORY:  A very pleasant 75 year old female patient with above history of stage II breast cancer currently on adjuvant anastrozole is here for follow-up.   Given the intolerance to Femara; patient started on Arimidex in April 2017. Patient denies any unusual bone pain or joint pains at this time. Denies any new lumps.  She has noted significant improvement in her neuropathy/ status post acupuncture.  Patient is on vitamin D and also calcium.     She denies any hot flashes.  REVIEW OF SYSTEMS:  A complete 10 point review of system is done which is negative except mentioned above/history of present illness.   PAST MEDICAL HISTORY :  Past Medical History  Diagnosis Date  . Hypertension   . Depression   . Hyperlipidemia   . Osteoarthritis   . Insomnia   . Cataracts, bilateral   . UTI (lower urinary tract infection)   . Anxiety   . COPD (chronic obstructive pulmonary disease) (HCC)     mild COPD  . Shortness of breath dyspnea   . Anemia   . Neuropathy due to chemotherapeutic drug ([place])   . Breast cancer ([place])     1990 right breast. 07/2014 left breast cancer  . C. difficile diarrhea     see dr Vira Agar and this is her second course of vancomycin    PAST SURGICAL HISTORY :   Past Surgical History  Procedure Laterality Date  . Mastectomy Right     lower 1990  . Eye surgery Bilateral     cataract extractions  . Breast  biopsy Left 05/07/2015    Procedure: BREAST BIOPSY WITH NEEDLE LOCALIZATION;  Surgeon: Dia Crawford III, MD;  Location: [facility] ORS;  Service: General;  Laterality: Left;  . Breast lumpectomy with sentinel lymph node biopsy Left 05/07/2015    Procedure: PARTIAL MASTECTOMY WITH SENTINEL LYMPH NODE BX;  Surgeon: Dia Crawford III, MD;  Location: [facility] ORS;  Service: General;  Laterality: Left;    FAMILY HISTORY :   Family History  Problem Relation Age of Onset  . Dementia Mother   . Bladder Cancer Father     SOCIAL HISTORY:   Social History  Substance Use Topics  . Smoking status: Former Smoker -- 2.00 packs/day for 25 years    Types: Cigarettes    Quit date: 04/28/1992  . Smokeless tobacco: Never Used     Comment: Pt quit 1995  . Alcohol Use: No    ALLERGIES:  is allergic to diphenhydramine and hydrochloric acid.  MEDICATIONS:  Current Outpatient Prescriptions  Medication Sig Dispense Refill  . anastrozole (ARIMIDEX) 1 MG tablet Take 1 tablet (1 mg total) by mouth daily. 30 tablet 3  . Calcium Carb-Cholecalciferol (CALCIUM 600 + D PO) Take 1 tablet by mouth daily.    . diclofenac (VOLTAREN) 75 MG EC tablet One pill every 12 hours as needed. [place]  tablet 0  . gabapentin (NEURONTIN) 300 MG capsule Take 2 capsules (600 mg total) by mouth 2 (two) times daily. 120 capsule 4  . ibuprofen (ADVIL,MOTRIN) 200 MG tablet Take 200 mg by mouth every 6 (six) hours as needed.    Marland Kitchen losartan (COZAAR) 50 MG tablet Take by mouth.    . potassium chloride SA (K-DUR,KLOR-CON) 20 MEQ tablet Take 1 tablet (20 mEq total) by mouth daily as needed. (Patient taking differently: Take 20 mEq by mouth daily. ) 30 tablet 3  . sertraline (ZOLOFT) 100 MG tablet Take 100 mg by mouth daily.    Marland Kitchen zolpidem (AMBIEN) 10 MG tablet Take 1 tablet (10 mg total) by mouth at bedtime. 30 tablet 1  . Fluticasone-Salmeterol (ADVAIR DISKUS) 250-50 MCG/DOSE AEPB Inhale 1 puff into the lungs 2 (two) times daily. GARGLE AND RINSE AFTER EACH USE. 60  each 3   No current facility-administered medications for this visit.   Facility-Administered Medications Ordered in Other Visits  Medication Dose Route Frequency Provider Last Rate Last Dose  . sodium chloride 0.9 % injection 10 mL  10 mL Intravenous PRN Leia Alf, MD      . sodium chloride 0.9 % injection 10 mL  10 mL Intracatheter PRN Leia Alf, MD   10 mL at 03/16/15 1110  . sodium chloride 0.9 % injection 10 mL  10 mL Intravenous PRN Leia Alf, MD   10 mL at 06/23/15 1236  . sodium chloride flush (NS) 0.9 % injection 10 mL  10 mL Intravenous PRN Cammie Sickle, MD   10 mL at 01/28/16 1421    PHYSICAL EXAMINATION: ECOG PERFORMANCE STATUS: 1 - Symptomatic but completely ambulatory;  BP 144/83 mmHg  Pulse 97  Temp(Src) 96.5 F (35.8 C) (Tympanic)  Resp 18  Wt 222 lb 3.6 oz (100.8 kg)  Filed Weights   03/10/16 1427  Weight: 222 lb 3.6 oz (100.8 kg)    GENERAL: Well-nourished well-developed; Alert, no distress and comfortable.   Alone.  EYES: no pallor or icterus OROPHARYNX: no thrush or ulceration; good dentition  NECK: supple, no masses felt LYMPH:  no palpable lymphadenopathy in the cervical, axillary or inguinal regions LUNGS: clear to auscultation and  No wheeze or crackles HEART/CVS: regular rate & rhythm and no murmurs; No lower extremity edema ABDOMEN:abdomen soft, non-tender and normal bowel sounds Musculoskeletal:no cyanosis of digits and no clubbing  PSYCH: alert & oriented x 3 with fluent speech NEURO: no focal motor/sensory deficits SKIN: No rash.    LABORATORY DATA:  I have reviewed the data as listed    Component Value Date/Time   NA 135 09/01/2015 0942   NA 136 02/19/2015 2136   K 4.0 09/01/2015 0942   K 4.0 02/19/2015 2136   CL 103 09/01/2015 0942   CL 104 02/19/2015 2136   CO2 26 09/01/2015 0942   CO2 25 02/19/2015 2136   GLUCOSE 104* 09/01/2015 0942   GLUCOSE 124* 02/19/2015 2136   BUN 17 09/01/2015 0942   BUN 11  02/19/2015 2136   CREATININE 0.63 09/01/2015 0942   CREATININE 0.46 02/19/2015 2136   CALCIUM 8.3* 09/01/2015 0942   CALCIUM 8.5* 02/19/2015 2136   PROT 7.1 09/01/2015 0942   PROT 6.3* 02/19/2015 2136   ALBUMIN 4.0 09/01/2015 0942   ALBUMIN 3.8 02/19/2015 2136   AST 15 09/01/2015 0942   AST 21 02/19/2015 2136   ALT 13* 09/01/2015 0942   ALT 19 02/19/2015 2136   ALKPHOS 79 09/01/2015 0942  ALKPHOS 78 02/19/2015 2136   BILITOT 0.5 09/01/2015 0942   BILITOT 0.8 02/19/2015 2136   GFRNONAA >60 09/01/2015 0942   GFRNONAA >60 02/19/2015 2136   GFRNONAA >60 11/23/2014 1541   GFRAA >60 09/01/2015 0942   GFRAA >60 02/19/2015 2136   GFRAA >60 11/23/2014 1541    No results found for: SPEP, UPEP  Lab Results  Component Value Date   WBC 6.4 09/01/2015   NEUTROABS 5.3 09/01/2015   HGB 11.7* 09/01/2015   HCT 35.0 09/01/2015   MCV 96.2 09/01/2015   PLT 229 09/01/2015      Chemistry      Component Value Date/Time   NA 135 09/01/2015 0942   NA 136 02/19/2015 2136   K 4.0 09/01/2015 0942   K 4.0 02/19/2015 2136   CL 103 09/01/2015 0942   CL 104 02/19/2015 2136   CO2 26 09/01/2015 0942   CO2 25 02/19/2015 2136   BUN 17 09/01/2015 0942   BUN 11 02/19/2015 2136   CREATININE 0.63 09/01/2015 0942   CREATININE 0.46 02/19/2015 2136      Component Value Date/Time   CALCIUM 8.3* 09/01/2015 0942   CALCIUM 8.5* 02/19/2015 2136   ALKPHOS 79 09/01/2015 0942   ALKPHOS 78 02/19/2015 2136   AST 15 09/01/2015 0942   AST 21 02/19/2015 2136   ALT 13* 09/01/2015 0942   ALT 19 02/19/2015 2136   BILITOT 0.5 09/01/2015 0942   BILITOT 0.8 02/19/2015 2136     ASSESSMENT & PLAN:  # LEFT BREAST CANCER STAGE II ER/PR-POS; her 2 neu-NEG status post neoadjuvant chemotherapy - status post radiation currently on Arimidex. Significant improvement of her arthralgias noted. Patient is taking 10,000 units of vitamin D A day. We will order a mammogram diagnostic of the left side.   # PN- grade 2 from  chemotherapy; significant improvement noted status post acupuncture.   #  Discussed regarding the port-  Decided to keep the port for now. She'll continue port flushes.  # 3 months/no labs.        Cammie Sickle, MD 03/10/2016 2:34 PM

## 2016-03-15 ENCOUNTER — Encounter: Payer: Self-pay | Admitting: Radiation Oncology

## 2016-03-15 ENCOUNTER — Ambulatory Visit
Admission: RE | Admit: 2016-03-15 | Discharge: 2016-03-15 | Disposition: A | Discharge status: Home / Self Care | Payer: Medicare Other | Source: Ambulatory Visit | Attending: Radiation Oncology | Admitting: Radiation Oncology

## 2016-03-15 VITALS — BP 130/76 | HR 95 | Temp 95.7°F | Resp 20 | Wt 221.6 lb

## 2016-03-15 DIAGNOSIS — C50412 Malignant neoplasm of upper-outer quadrant of left female breast: Secondary | ICD-10-CM

## 2016-03-15 NOTE — Progress Notes (Signed)
Radiation Oncology Follow up Note  Name: Heidi [name]   Date:   03/15/2016 MRN:  [id number] DOB: 04-16-1941    This 75 y.o. female presents to the clinic today for six-month follow-up for breast cancer stage IIa status post new adjuvant chemotherapy and adjuvant radiation therapy to her left breast and peripheral lymphatics ER/PR positive HER-2/neu negative.  REFERRING PROVIDER: Leia Alf, MD  HPI: Patient is a 75 year old female now out 6 months having completed radiation therapy to her left breast and peripheral lymphatics for stage IIa and invasive mammary carcinoma (T1 N1 M0) status post neoadjuvant chemotherapy and whole breast peripheral lymphatic radiation. She's had a mastectomy of her right breast remotely. She's currently on aromatase tolerating that well. Her peripheral neuropathy is improving and she has been receiving some acupuncture which is working well. She specifically denies breast tenderness cough or bone pain..  COMPLICATIONS OF TREATMENT: none  FOLLOW UP COMPLIANCE: keeps appointments   PHYSICAL EXAM:  BP 130/76 mmHg  Pulse 95  Temp(Src) 95.7 F (35.4 C)  Resp 20  Wt 221 lb 9 oz (100.5 kg) Patient is status post right mastectomy. No mass or nodularity in mastectomy scar is noted. Left breast is free of dominant mass or nodularity in 2 positions examined. No axillary or supraclavicular adenopathy is noted. Well-developed well-nourished patient in NAD. HEENT reveals PERLA, EOMI, discs not visualized.  Oral cavity is clear. No oral mucosal lesions are identified. Neck is clear without evidence of cervical or supraclavicular adenopathy. Lungs are clear to A&P. Cardiac examination is essentially unremarkable with regular rate and rhythm without murmur rub or thrill. Abdomen is benign with no organomegaly or masses noted. Motor sensory and DTR levels are equal and symmetric in the upper and lower extremities. Cranial nerves II through XII are grossly intact.  Proprioception is intact. No peripheral adenopathy or edema is identified. No motor or sensory levels are noted. Crude visual fields are within normal range.  RADIOLOGY RESULTS: Patient scheduled for mammograms in June.  PLAN: Present time she is doing well with no evidence of disease. I'm please were overall progress. She continues on aromatase without side effect. I've asked to see her back in 1 year for follow-up. She is asking For port removal was be decided by medical oncology. Patient is to call with any concerns.  I would like to take this opportunity for allowing me to participate in the care of your patient.Armstead Peaks., MD

## 2016-03-21 ENCOUNTER — Inpatient Hospital Stay: Payer: Medicare Other

## 2016-03-30 ENCOUNTER — Ambulatory Visit
Admission: RE | Admit: 2016-03-30 | Discharge: 2016-03-30 | Disposition: A | Discharge status: Home / Self Care | Payer: Medicare Other | Source: Ambulatory Visit | Attending: Internal Medicine | Admitting: Internal Medicine

## 2016-03-30 DIAGNOSIS — C50412 Malignant neoplasm of upper-outer quadrant of left female breast: Secondary | ICD-10-CM

## 2016-03-30 DIAGNOSIS — Z9011 Acquired absence of right breast and nipple: Secondary | ICD-10-CM | POA: Diagnosis not present

## 2016-03-30 DIAGNOSIS — Z853 Personal history of malignant neoplasm of breast: Secondary | ICD-10-CM | POA: Diagnosis present

## 2016-04-12 ENCOUNTER — Inpatient Hospital Stay: Payer: Medicare Other

## 2016-05-02 ENCOUNTER — Inpatient Hospital Stay: Payer: Medicare Other

## 2016-06-08 ENCOUNTER — Other Ambulatory Visit: Payer: Self-pay | Admitting: Internal Medicine

## 2016-06-08 DIAGNOSIS — C50912 Malignant neoplasm of unspecified site of left female breast: Secondary | ICD-10-CM

## 2016-06-09 ENCOUNTER — Inpatient Hospital Stay: Payer: Medicare Other | Attending: Internal Medicine | Admitting: Internal Medicine

## 2016-06-09 ENCOUNTER — Inpatient Hospital Stay: Payer: Medicare Other

## 2016-06-09 VITALS — BP 144/61 | HR 92 | Temp 99.2°F | Resp 18 | Wt 223.6 lb

## 2016-06-09 DIAGNOSIS — G629 Polyneuropathy, unspecified: Secondary | ICD-10-CM | POA: Insufficient documentation

## 2016-06-09 DIAGNOSIS — M545 Low back pain, unspecified: Secondary | ICD-10-CM

## 2016-06-09 DIAGNOSIS — C50812 Malignant neoplasm of overlapping sites of left female breast: Secondary | ICD-10-CM | POA: Diagnosis not present

## 2016-06-09 DIAGNOSIS — I10 Essential (primary) hypertension: Secondary | ICD-10-CM | POA: Diagnosis not present

## 2016-06-09 DIAGNOSIS — M1990 Unspecified osteoarthritis, unspecified site: Secondary | ICD-10-CM | POA: Diagnosis not present

## 2016-06-09 DIAGNOSIS — Z86718 Personal history of other venous thrombosis and embolism: Secondary | ICD-10-CM | POA: Diagnosis not present

## 2016-06-09 DIAGNOSIS — R0602 Shortness of breath: Secondary | ICD-10-CM | POA: Insufficient documentation

## 2016-06-09 DIAGNOSIS — G4700 Insomnia, unspecified: Secondary | ICD-10-CM | POA: Diagnosis not present

## 2016-06-09 DIAGNOSIS — C801 Malignant (primary) neoplasm, unspecified: Secondary | ICD-10-CM

## 2016-06-09 DIAGNOSIS — Z87891 Personal history of nicotine dependence: Secondary | ICD-10-CM | POA: Insufficient documentation

## 2016-06-09 DIAGNOSIS — F329 Major depressive disorder, single episode, unspecified: Secondary | ICD-10-CM | POA: Diagnosis not present

## 2016-06-09 DIAGNOSIS — Z79899 Other long term (current) drug therapy: Secondary | ICD-10-CM | POA: Insufficient documentation

## 2016-06-09 DIAGNOSIS — Z79811 Long term (current) use of aromatase inhibitors: Secondary | ICD-10-CM | POA: Insufficient documentation

## 2016-06-09 DIAGNOSIS — R9439 Abnormal result of other cardiovascular function study: Secondary | ICD-10-CM

## 2016-06-09 DIAGNOSIS — Z923 Personal history of irradiation: Secondary | ICD-10-CM | POA: Diagnosis not present

## 2016-06-09 DIAGNOSIS — F419 Anxiety disorder, unspecified: Secondary | ICD-10-CM

## 2016-06-09 DIAGNOSIS — Z87440 Personal history of urinary (tract) infections: Secondary | ICD-10-CM | POA: Diagnosis not present

## 2016-06-09 DIAGNOSIS — Z9221 Personal history of antineoplastic chemotherapy: Secondary | ICD-10-CM | POA: Diagnosis not present

## 2016-06-09 DIAGNOSIS — Z8052 Family history of malignant neoplasm of bladder: Secondary | ICD-10-CM | POA: Diagnosis not present

## 2016-06-09 DIAGNOSIS — Z853 Personal history of malignant neoplasm of breast: Secondary | ICD-10-CM | POA: Insufficient documentation

## 2016-06-09 DIAGNOSIS — J449 Chronic obstructive pulmonary disease, unspecified: Secondary | ICD-10-CM | POA: Insufficient documentation

## 2016-06-09 DIAGNOSIS — E785 Hyperlipidemia, unspecified: Secondary | ICD-10-CM | POA: Diagnosis not present

## 2016-06-09 DIAGNOSIS — R197 Diarrhea, unspecified: Secondary | ICD-10-CM | POA: Diagnosis not present

## 2016-06-09 DIAGNOSIS — Z170 Estrogen receptor positive status [ER+]: Secondary | ICD-10-CM | POA: Diagnosis not present

## 2016-06-09 DIAGNOSIS — M549 Dorsalgia, unspecified: Secondary | ICD-10-CM | POA: Insufficient documentation

## 2016-06-09 DIAGNOSIS — M546 Pain in thoracic spine: Secondary | ICD-10-CM

## 2016-06-09 MED ORDER — SODIUM CHLORIDE 0.9% FLUSH
10.0000 mL | INTRAVENOUS | Status: DC | PRN
  Administered 2016-06-09: 10 mL via INTRAVENOUS
  Filled 2016-06-09: qty 10

## 2016-06-09 MED ORDER — HYDROCODONE-ACETAMINOPHEN 5-325 MG PO TABS: 1.0000 | ORAL_TABLET | Freq: Three times a day (TID) | ORAL | 0 refills | Status: DC | PRN

## 2016-06-09 MED ORDER — HEPARIN SOD (PORK) LOCK FLUSH 100 UNIT/ML IV SOLN
500.0000 [IU] | Freq: Once | INTRAVENOUS | Status: AC
  Administered 2016-06-09: 500 [IU] via INTRAVENOUS
  Filled 2016-06-09: qty 5

## 2016-06-09 NOTE — Progress Notes (Signed)
[place] OFFICE PROGRESS NOTE  Patient Care Team: [name], MD as PCP - General (Family Medicine)   SUMMARY OF ONCOLOGIC HISTORY:  Oncology History   # March 2016- LEFT BREAST CANCER STAGE II ER- 90%; PR-90%; Her 2 neu- NEG; NEO-ADJ CHEMO-  AC x4- Taxol x12 w;;s/p LUMPEC & SLNBx [ypT1 [1.5cm]; ypsN 3/6 (2 macro & 1 micro met)]  s/p RT [Nov 1st week;finish] NOV 2016- START FEMARA stop Feb 2017; April 2017- start Arimidex  # Right Breast cancer ? Stage;  s/p Mastec [1990;s/p Chemo; RT ]  # PN G-2 on neurontin; Hx PE on eliquis  # Mediport     Cancer of overlapping sites of left female breast ([place])   06/09/2016 Initial Diagnosis    Cancer of overlapping sites of left female breast [facility])       INTERVAL HISTORY:  A very pleasant 75 year old female patient with above history of stage II breast cancer currently on adjuvant anastrozole is here for follow-up.   Patient noted to have sudden onset of mid to low back pain approximately 10 days ago which was mechanical. Certainly getting any better. Patient has tried Aleve and also is past.  Otherwise Patient denies any unusual bone pain or joint pains at this time. Denies any new lumps.  She has noted significant improvement in her neuropathy/ status post acupuncture.  Patient is on vitamin D and also calcium.     She denies any hot flashes.  REVIEW OF SYSTEMS:  A complete 10 point review of system is done which is negative except mentioned above/history of present illness.   PAST MEDICAL HISTORY :  Past Medical History:  Diagnosis Date  . Anemia   . Anxiety   . Breast cancer ([place]) 1990   RT MASTECTOMY  . Breast cancer ([place]) 2015   LT LUMPECTOMY  . C. difficile diarrhea    see dr Vira Agar and this is her second course of vancomycin  . Cataracts, bilateral   . COPD (chronic obstructive pulmonary disease) (HCC)    mild COPD  . Depression   . Hyperlipidemia   . Hypertension   . Insomnia   .  Neuropathy due to chemotherapeutic drug ([place])   . Osteoarthritis   . Shortness of breath dyspnea   . UTI (lower urinary tract infection)     PAST SURGICAL HISTORY :   Past Surgical History:  Procedure Laterality Date  . BREAST BIOPSY Left 05/07/2015   Procedure: BREAST BIOPSY WITH NEEDLE LOCALIZATION;  Surgeon: Dia Crawford III, MD;  Location: [facility] ORS;  Service: General;  Laterality: Left;  . BREAST LUMPECTOMY WITH SENTINEL LYMPH NODE BIOPSY Left 05/07/2015   Procedure: PARTIAL MASTECTOMY WITH SENTINEL LYMPH NODE BX;  Surgeon: Dia Crawford III, MD;  Location: [facility] ORS;  Service: General;  Laterality: Left;  . EYE SURGERY Bilateral    cataract extractions  . MASTECTOMY Right    1990's    FAMILY HISTORY :   Family History  Problem Relation Age of Onset  . Dementia Mother   . Bladder Cancer Father   . Breast cancer Neg Hx     SOCIAL HISTORY:   Social History  Substance Use Topics  . Smoking status: Former Smoker    Packs/day: 2.00    Years: 25.00    Types: Cigarettes    Quit date: 04/28/1992  . Smokeless tobacco: Never Used     Comment: Pt quit 1995  . Alcohol use No    ALLERGIES:  is allergic  to diphenhydramine and hydrochloric acid.  MEDICATIONS:  Current Outpatient Prescriptions  Medication Sig Dispense Refill  . anastrozole (ARIMIDEX) 1 MG tablet TAKE 1 TABLET (1 MG TOTAL) BY MOUTH DAILY. 30 tablet 3  . Calcium Carb-Cholecalciferol (CALCIUM 600 + D PO) Take 1 tablet by mouth daily.    . diclofenac (VOLTAREN) 75 MG EC tablet One pill every 12 hours as needed. 60 tablet 0  . gabapentin (NEURONTIN) 300 MG capsule Take 2 capsules (600 mg total) by mouth 2 (two) times daily. 120 capsule 4  . ibuprofen (ADVIL,MOTRIN) 200 MG tablet Take 200 mg by mouth every 6 (six) hours as needed.    . Omega-3 Fatty Acids (FISH OIL) 500 MG CAPS Take by mouth.    . potassium chloride SA (K-DUR,KLOR-CON) 20 MEQ tablet Take 1 tablet (20 mEq total) by mouth daily as needed. (Patient taking  differently: Take 20 mEq by mouth daily. ) 30 tablet 3  . sertraline (ZOLOFT) 100 MG tablet Take 100 mg by mouth daily.    Marland Kitchen zolpidem (AMBIEN) 10 MG tablet Take 1 tablet (10 mg total) by mouth at bedtime. 30 tablet 1  . Fluticasone-Salmeterol (ADVAIR DISKUS) 250-50 MCG/DOSE AEPB Inhale 1 puff into the lungs 2 (two) times daily. GARGLE AND RINSE AFTER EACH USE. 60 each 3  . HYDROcodone-acetaminophen (NORCO/VICODIN) 5-325 MG tablet Take 1 tablet by mouth every 8 (eight) hours as needed for moderate pain. 40 tablet 0  . losartan (COZAAR) 50 MG tablet Take by mouth.     No current facility-administered medications for this visit.    Facility-Administered Medications Ordered in Other Visits  Medication Dose Route Frequency Provider Last Rate Last Dose  . sodium chloride 0.9 % injection 10 mL  10 mL Intravenous PRN Leia Alf, MD      . sodium chloride 0.9 % injection 10 mL  10 mL Intracatheter PRN Leia Alf, MD   10 mL at 03/16/15 1110  . sodium chloride 0.9 % injection 10 mL  10 mL Intravenous PRN Leia Alf, MD   10 mL at 06/23/15 1236  . sodium chloride flush (NS) 0.9 % injection 10 mL  10 mL Intravenous PRN Cammie Sickle, MD   10 mL at 01/28/16 1421    PHYSICAL EXAMINATION: ECOG PERFORMANCE STATUS: 1 - Symptomatic but completely ambulatory;  BP (!) 144/61 (BP Location: Left Arm, Patient Position: Sitting)   Pulse 92   Temp 99.2 F (37.3 C) (Tympanic)   Resp 18   Wt 223 lb 9 oz (101.4 kg)   BMI 33.01 kg/m   Filed Weights   06/09/16 1506  Weight: 223 lb 9 oz (101.4 kg)    GENERAL: Well-nourished well-developed; Alert, no distress and comfortable.   Alone.  EYES: no pallor or icterus OROPHARYNX: no thrush or ulceration; good dentition  NECK: supple, no masses felt LYMPH:  no palpable lymphadenopathy in the cervical, axillary or inguinal regions LUNGS: clear to auscultation and  No wheeze or crackles HEART/CVS: regular rate & rhythm and no murmurs; No lower  extremity edema ABDOMEN:abdomen soft, non-tender and normal bowel sounds Musculoskeletal:no cyanosis of digits and no clubbing  PSYCH: alert & oriented x 3 with fluent speech NEURO: no focal motor/sensory deficits SKIN: No rash.    LABORATORY DATA:  I have reviewed the data as listed    Component Value Date/Time   NA 135 09/01/2015 0942   NA 136 02/19/2015 2136   K 4.0 09/01/2015 0942   K 4.0 02/19/2015 2136   CL  103 09/01/2015 0942   CL 104 02/19/2015 2136   CO2 26 09/01/2015 0942   CO2 25 02/19/2015 2136   GLUCOSE 104 (H) 09/01/2015 0942   GLUCOSE 124 (H) 02/19/2015 2136   BUN 17 09/01/2015 0942   BUN 11 02/19/2015 2136   CREATININE 0.63 09/01/2015 0942   CREATININE 0.46 02/19/2015 2136   CALCIUM 8.3 (L) 09/01/2015 0942   CALCIUM 8.5 (L) 02/19/2015 2136   PROT 7.1 09/01/2015 0942   PROT 6.3 (L) 02/19/2015 2136   ALBUMIN 4.0 09/01/2015 0942   ALBUMIN 3.8 02/19/2015 2136   AST 15 09/01/2015 0942   AST 21 02/19/2015 2136   ALT 13 (L) 09/01/2015 0942   ALT 19 02/19/2015 2136   ALKPHOS 79 09/01/2015 0942   ALKPHOS 78 02/19/2015 2136   BILITOT 0.5 09/01/2015 0942   BILITOT 0.8 02/19/2015 2136   GFRNONAA >60 09/01/2015 0942   GFRNONAA >60 02/19/2015 2136   GFRAA >60 09/01/2015 0942   GFRAA >60 02/19/2015 2136    No results found for: SPEP, UPEP  Lab Results  Component Value Date   WBC 6.4 09/01/2015   NEUTROABS 5.3 09/01/2015   HGB 11.7 (L) 09/01/2015   HCT 35.0 09/01/2015   MCV 96.2 09/01/2015   PLT 229 09/01/2015      Chemistry      Component Value Date/Time   NA 135 09/01/2015 0942   NA 136 02/19/2015 2136   K 4.0 09/01/2015 0942   K 4.0 02/19/2015 2136   CL 103 09/01/2015 0942   CL 104 02/19/2015 2136   CO2 26 09/01/2015 0942   CO2 25 02/19/2015 2136   BUN 17 09/01/2015 0942   BUN 11 02/19/2015 2136   CREATININE 0.63 09/01/2015 0942   CREATININE 0.46 02/19/2015 2136      Component Value Date/Time   CALCIUM 8.3 (L) 09/01/2015 0942   CALCIUM  8.5 (L) 02/19/2015 2136   ALKPHOS 79 09/01/2015 0942   ALKPHOS 78 02/19/2015 2136   AST 15 09/01/2015 0942   AST 21 02/19/2015 2136   ALT 13 (L) 09/01/2015 0942   ALT 19 02/19/2015 2136   BILITOT 0.5 09/01/2015 0942   BILITOT 0.8 02/19/2015 2136     ASSESSMENT & PLAN:  Cancer of overlapping sites of left female breast ([place]) # LEFT BREAST CANCER STAGE II ER/PR-POS; her 2 neu-NEG status post neoadjuvant chemotherapy - status post radiation currently on Arimidex.   # PN- grade 2 from chemotherapy; significant improvement noted status post acupuncture.   # Back pain- mechanical 10 days ago. Check back x-rays.   # port flushes 8 weeks.   # Follow-up in 4 months with labs.      Cammie Sickle, MD 06/09/2016 6:53 PM

## 2016-06-09 NOTE — Progress Notes (Signed)
Patient states she hurt her back while opening a window.  Wants to know if MD will prescribe pain medication.  Appt today related to breast cancer follow up.

## 2016-06-09 NOTE — Assessment & Plan Note (Addendum)
#   LEFT BREAST CANCER STAGE II ER/PR-POS; her 2 neu-NEG status post neoadjuvant chemotherapy - status post radiation currently on Arimidex.   # PN- grade 2 from chemotherapy; significant improvement noted status post acupuncture.   # Back pain- mechanical 10 days ago. Check back x-rays.   # port flushes 8 weeks.   # Follow-up in 4 months with labs.  # 25 minutes face-to-face with the patient discussing the above plan of care; more than 50% of time spent on prognosis/ natural history; counseling and coordination.

## 2016-06-10 DIAGNOSIS — M47816 Spondylosis without myelopathy or radiculopathy, lumbar region: Secondary | ICD-10-CM | POA: Insufficient documentation

## 2016-06-10 DIAGNOSIS — I10 Essential (primary) hypertension: Secondary | ICD-10-CM | POA: Diagnosis not present

## 2016-06-10 DIAGNOSIS — Z853 Personal history of malignant neoplasm of breast: Secondary | ICD-10-CM | POA: Diagnosis not present

## 2016-06-10 DIAGNOSIS — J449 Chronic obstructive pulmonary disease, unspecified: Secondary | ICD-10-CM | POA: Insufficient documentation

## 2016-06-10 DIAGNOSIS — Z87891 Personal history of nicotine dependence: Secondary | ICD-10-CM | POA: Diagnosis not present

## 2016-06-10 DIAGNOSIS — M545 Low back pain: Secondary | ICD-10-CM | POA: Diagnosis present

## 2016-06-10 NOTE — ED Notes (Signed)
Patient to stat registration via EMS by wheelchair by EMS. EMS reports patient complaint of back pain for 10 days, vs with in normal limits.  Per EMS patient has seen PMD and given prescription that patient reports is not working.

## 2016-06-10 NOTE — ED Triage Notes (Signed)
Patient reports having mid lower back pain for approximately 10 days.  Reports she has seen her PMD and recommended to have an x-ray.

## 2016-06-11 ENCOUNTER — Emergency Department
Admission: EM | Admit: 2016-06-11 | Discharge: 2016-06-11 | Disposition: A | Discharge status: Home / Self Care | Payer: Medicare Other | Attending: Emergency Medicine | Admitting: Emergency Medicine

## 2016-06-11 ENCOUNTER — Emergency Department: Payer: Medicare Other

## 2016-06-11 ENCOUNTER — Encounter: Payer: Self-pay | Admitting: Emergency Medicine

## 2016-06-11 DIAGNOSIS — M47816 Spondylosis without myelopathy or radiculopathy, lumbar region: Secondary | ICD-10-CM

## 2016-06-11 MED ORDER — IBUPROFEN 800 MG PO TABS
800.0000 mg | ORAL_TABLET | Freq: Once | ORAL | Status: AC
  Administered 2016-06-11: 800 mg via ORAL

## 2016-06-11 MED ORDER — IBUPROFEN 800 MG PO TABS
ORAL_TABLET | ORAL | Status: AC
  Filled 2016-06-11: qty 1

## 2016-06-11 MED ORDER — KETOROLAC TROMETHAMINE 60 MG/2ML IM SOLN
60.0000 mg | Freq: Once | INTRAMUSCULAR | Status: AC
  Administered 2016-06-11: 60 mg via INTRAMUSCULAR
  Filled 2016-06-11: qty 2

## 2016-06-11 NOTE — ED Notes (Signed)
Dr brown in to follow up 

## 2016-06-11 NOTE — ED Notes (Signed)
Patient transported to X-ray by Ludwig Clarks from radiology

## 2016-06-11 NOTE — ED Notes (Signed)
Pt given water to drink as allowed by MD

## 2016-06-11 NOTE — ED Provider Notes (Signed)
[facility] Emergency Department Provider Note ____________________________________________   First MD Initiated Contact with Patient 06/11/16 0258     (approximate)  I have reviewed the triage vital signs and the nursing notes.   HISTORY  Chief Complaint Back Pain    HPI Heidi [name] is a 75 y.o. female with history of breast cancer presents with 9 out of 10 low back pain for approximately 10 days that is worse with movement. Patient states that she was advised by her oncologist to have a x-ray performed which resulted in her visitation to the emergency department. Patient denies any lower extremity pain or weakness patient denies any gait instability. Patient denies any urinary or bladder habit changes.  Past Medical History:  Diagnosis Date  . Anemia   . Anxiety   . Breast cancer ([place]) 1990   RT MASTECTOMY  . Breast cancer ([place]) 2015   LT LUMPECTOMY  . C. difficile diarrhea    see dr Vira Agar and this is her second course of vancomycin  . Cataracts, bilateral   . COPD (chronic obstructive pulmonary disease) (HCC)    mild COPD  . Depression   . Hyperlipidemia   . Hypertension   . Insomnia   . Neuropathy due to chemotherapeutic drug ([place])   . Osteoarthritis   . Shortness of breath dyspnea   . UTI (lower urinary tract infection)     Patient Active Problem List   Diagnosis Date Noted  . Cancer of overlapping sites of left female breast ([place]) 06/09/2016  . Back pain 06/09/2016  . Acute bilateral thoracic back pain 06/09/2016  . Acute lumbar back pain 06/09/2016  . Preop cardiovascular exam 05/05/2015  . Abnormal stress test 03/01/2015  . Right subsegmental pulmonary embolus 02/23/2015  . COPD type A ([place]) 02/23/2015  . Breast cancer ([place]) 02/16/2015  . Smoking history 02/16/2015  . Essential hypertension 02/16/2015  . Obesity 02/16/2015  . History of chemotherapy 02/16/2015  . DOE (dyspnea on exertion) 01/21/2015    Past Surgical  History:  Procedure Laterality Date  . BREAST BIOPSY Left 05/07/2015   Procedure: BREAST BIOPSY WITH NEEDLE LOCALIZATION;  Surgeon: Dia Crawford III, MD;  Location: [facility] ORS;  Service: General;  Laterality: Left;  . BREAST LUMPECTOMY WITH SENTINEL LYMPH NODE BIOPSY Left 05/07/2015   Procedure: PARTIAL MASTECTOMY WITH SENTINEL LYMPH NODE BX;  Surgeon: Dia Crawford III, MD;  Location: [facility] ORS;  Service: General;  Laterality: Left;  . EYE SURGERY Bilateral    cataract extractions  . MASTECTOMY Right    1990's    Prior to Admission medications   Medication Sig Start Date End Date Taking? Authorizing Provider  anastrozole (ARIMIDEX) 1 MG tablet TAKE 1 TABLET (1 MG TOTAL) BY MOUTH DAILY. 06/08/16   Cammie Sickle, MD  Calcium Carb-Cholecalciferol (CALCIUM 600 + D PO) Take 1 tablet by mouth daily.    Historical Provider, MD  diclofenac (VOLTAREN) 75 MG EC tablet One pill every 12 hours as needed. 12/17/15   Cammie Sickle, MD  Fluticasone-Salmeterol (ADVAIR DISKUS) 250-50 MCG/DOSE AEPB Inhale 1 puff into the lungs 2 (two) times daily. GARGLE AND RINSE AFTER EACH USE. 02/23/15 02/23/16  Vilinda Boehringer, MD  gabapentin (NEURONTIN) 300 MG capsule Take 2 capsules (600 mg total) by mouth 2 (two) times daily. 11/22/15   Cammie Sickle, MD  HYDROcodone-acetaminophen (NORCO/VICODIN) 5-325 MG tablet Take 1 tablet by mouth every 8 (eight) hours as needed for moderate pain. 06/09/16   Cammie Sickle,  MD  ibuprofen (ADVIL,MOTRIN) 200 MG tablet Take 200 mg by mouth every 6 (six) hours as needed.    Historical Provider, MD  losartan (COZAAR) 50 MG tablet Take by mouth. 05/18/15 05/17/16  Historical Provider, MD  Omega-3 Fatty Acids (FISH OIL) 500 MG CAPS Take by mouth.    Historical Provider, MD  potassium chloride SA (K-DUR,KLOR-CON) 20 MEQ tablet Take 1 tablet (20 mEq total) by mouth daily as needed. Patient taking differently: Take 20 mEq by mouth daily.  02/16/15   Minna Merritts, MD  sertraline  (ZOLOFT) 100 MG tablet Take 100 mg by mouth daily.    Historical Provider, MD  zolpidem (AMBIEN) 10 MG tablet Take 1 tablet (10 mg total) by mouth at bedtime. 05/26/15   Forest Gleason, MD    Allergies Diphenhydramine and Hydrochloric acid  Family History  Problem Relation Age of Onset  . Dementia Mother   . Bladder Cancer Father   . Breast cancer Neg Hx     Social History Social History  Substance Use Topics  . Smoking status: Former Smoker    Packs/day: 2.00    Years: 25.00    Types: Cigarettes    Quit date: 04/28/1992  . Smokeless tobacco: Never Used     Comment: Pt quit 1995  . Alcohol use No    Review of Systems Constitutional: No fever/chills Eyes: No visual changes. ENT: No sore throat. Cardiovascular: Denies chest pain. Respiratory: Denies shortness of breath. Gastrointestinal: No abdominal pain.  No nausea, no vomiting.  No diarrhea.  No constipation. Genitourinary: Negative for dysuria. Musculoskeletal: Positive for back pain. Skin: Negative for rash. Neurological: Negative for headaches, focal weakness or numbness.    10-point ROS otherwise negative.  ____________________________________________   PHYSICAL EXAM:  VITAL SIGNS: ED Triage Vitals  Enc Vitals Group     BP 06/10/16 2359 (!) 145/74     Pulse Rate 06/10/16 2359 98     Resp 06/10/16 2359 20     Temp 06/10/16 2359 99 F (37.2 C)     Temp Source 06/10/16 2359 Oral     SpO2 06/10/16 2359 96 %     Weight 06/10/16 2357 224 lb (101.6 kg)     Height 06/10/16 2357 5\' 7"  (1.702 m)     Head Circumference --      Peak Flow --      Pain Score 06/10/16 2358 10     Pain Loc --      Pain Edu? --      Excl. in [place]? --     Constitutional: Alert and oriented. Well appearing and in no acute distress. Eyes: Conjunctivae are normal. PERRL. EOMI. Head: Atraumatic. Ears:  Healthy appearing ear canals and TMs bilaterally Nose: No congestion/rhinnorhea. Mouth/Throat: Mucous membranes are moist.  Oropharynx  non-erythematous. Neck: No stridor.  No meningeal signs.   Cardiovascular: Normal rate, regular rhythm. Good peripheral circulation. Grossly normal heart sounds.   Respiratory: Normal respiratory effort.  No retractions. Lungs CTAB. Gastrointestinal: Soft and nontender. No distention.  Musculoskeletal: No lower extremity tenderness nor edema. No gross deformities of extremities. Neurologic:  Normal speech and language. No gross focal neurologic deficits are appreciated.  Skin:  Skin is warm, dry and intact. No rash noted. Psychiatric: Mood and affect are normal. Speech and behavior are normal.    Labs Reviewed - No data to display   RADIOLOGY I, [place], personally viewed and evaluated these images (plain radiographs) as part of my medical decision making,  as well as reviewing the written report by the radiologist.  Dg Lumbar Spine Complete  Result Date: 06/11/2016 CLINICAL DATA:  Acute onset of lower back pain.  Initial encounter. EXAM: LUMBAR SPINE - COMPLETE 4+ VIEW COMPARISON:  PET/CT performed 09/17/2014 FINDINGS: There is no evidence of acute fracture or subluxation. Vertebral bodies demonstrate normal alignment. Mild disc space narrowing is noted at L5-S1. There is slight chronic loss of height at L1. Mild facet disease is noted at the lower lumbar spine. The visualized bowel gas pattern is unremarkable in appearance; air and stool are noted within the colon. The sacroiliac joints are within normal limits. IMPRESSION: 1. No evidence of acute fracture or subluxation along the lumbar spine. 2. Mild degenerative change at the lower lumbar spine. Electronically Signed   By: Garald Balding M.D.   On: 06/11/2016 02:52    ____________________________________________   Procedures    INITIAL IMPRESSION / ASSESSMENT AND PLAN / ED COURSE  Pertinent labs & imaging results that were available during my care of the patient were reviewed by me and considered in my medical decision  making (see chart for details).  Patient received 30 mg of IV Toradol with resolution of pain.  Clinical Course    ____________________________________________  FINAL CLINICAL IMPRESSION(S) / ED DIAGNOSES  Final diagnoses:  Lumbar spondylosis, unspecified spinal osteoarthritis     MEDICATIONS GIVEN DURING THIS VISIT:  Medications  ketorolac (TORADOL) injection 60 mg (60 mg Intramuscular Given 06/11/16 0153)     NEW OUTPATIENT MEDICATIONS STARTED DURING THIS VISIT:  New Prescriptions   No medications on file      Note:  This document was prepared using Dragon voice recognition software and may include unintentional dictation errors.    Gregor Hams, MD 06/15/16 [phone]

## 2016-06-11 NOTE — ED Notes (Signed)
Pt awake and using call bell; requesting radiology results; understands MD will be in shortly to follow up; denies pain

## 2016-06-11 NOTE — ED Notes (Signed)
Pt assisted onto bedpan by ED tech Sam; when moving pt off bedpan, the urine spilled out when rolled to the side; patient and bed cleaned well; clean sheets and gown on pt; new warm blankets placed; pt appreciative;

## 2016-06-27 ENCOUNTER — Other Ambulatory Visit: Payer: Self-pay | Admitting: Internal Medicine

## 2016-06-27 DIAGNOSIS — C50912 Malignant neoplasm of unspecified site of left female breast: Secondary | ICD-10-CM

## 2016-08-04 ENCOUNTER — Inpatient Hospital Stay: Payer: Medicare Other

## 2016-08-09 ENCOUNTER — Inpatient Hospital Stay: Payer: Medicare Other | Attending: Internal Medicine

## 2016-08-09 DIAGNOSIS — C50812 Malignant neoplasm of overlapping sites of left female breast: Secondary | ICD-10-CM | POA: Diagnosis not present

## 2016-08-09 DIAGNOSIS — Z170 Estrogen receptor positive status [ER+]: Secondary | ICD-10-CM | POA: Diagnosis not present

## 2016-08-09 DIAGNOSIS — Z79811 Long term (current) use of aromatase inhibitors: Secondary | ICD-10-CM | POA: Insufficient documentation

## 2016-08-09 DIAGNOSIS — C50912 Malignant neoplasm of unspecified site of left female breast: Secondary | ICD-10-CM

## 2016-08-09 DIAGNOSIS — Z452 Encounter for adjustment and management of vascular access device: Secondary | ICD-10-CM | POA: Insufficient documentation

## 2016-08-09 DIAGNOSIS — C773 Secondary and unspecified malignant neoplasm of axilla and upper limb lymph nodes: Secondary | ICD-10-CM

## 2016-08-09 MED ORDER — SODIUM CHLORIDE 0.9% FLUSH
10.0000 mL | INTRAVENOUS | Status: DC | PRN
  Administered 2016-08-09: 10 mL via INTRAVENOUS
  Filled 2016-08-09: qty 10

## 2016-08-09 MED ORDER — HEPARIN SOD (PORK) LOCK FLUSH 100 UNIT/ML IV SOLN
500.0000 [IU] | Freq: Once | INTRAVENOUS | Status: AC
  Administered 2016-08-09: 500 [IU] via INTRAVENOUS
  Filled 2016-08-09: qty 5

## 2016-09-11 ENCOUNTER — Other Ambulatory Visit: Payer: Self-pay | Admitting: Internal Medicine

## 2016-09-11 DIAGNOSIS — C50912 Malignant neoplasm of unspecified site of left female breast: Secondary | ICD-10-CM

## 2016-09-13 IMAGING — CT CT ANGIO CHEST
2 of 6 series · 18 of 36 positions shown · IV contrast (APPLIED)
Comparison: CT scan of February 19, 2015.

CLINICAL DATA: Shortness of breath. Current history of left-sided
breast cancer.

EXAM:
CT ANGIOGRAPHY CHEST WITH CONTRAST
TECHNIQUE: Multidetector CT imaging of the chest was performed using the
standard protocol during bolus administration of intravenous
contrast. Multiplanar CT image reconstructions and MIPs were
obtained to evaluate the vascular anatomy.
CONTRAST:  100mL OMNIPAQUE IOHEXOL 350 MG/ML SOLN

[Series 5: pe thins 1.5 · axial · 0.58mm/px · z∈[-827,-578]mm · 17 of 188 slices shown]
[im 11/188  lung]
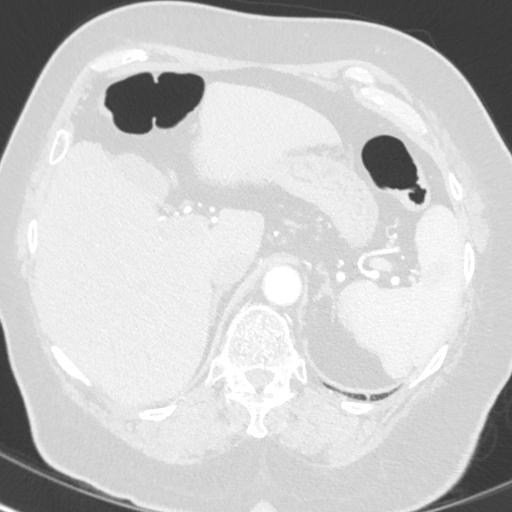
[im 21/188  mediastinal]
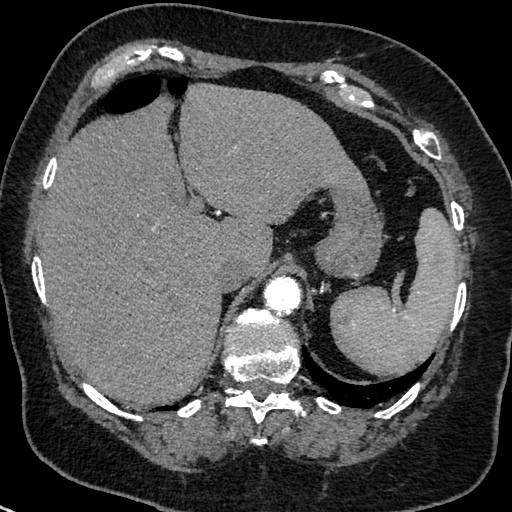
[im 32/188  lung]
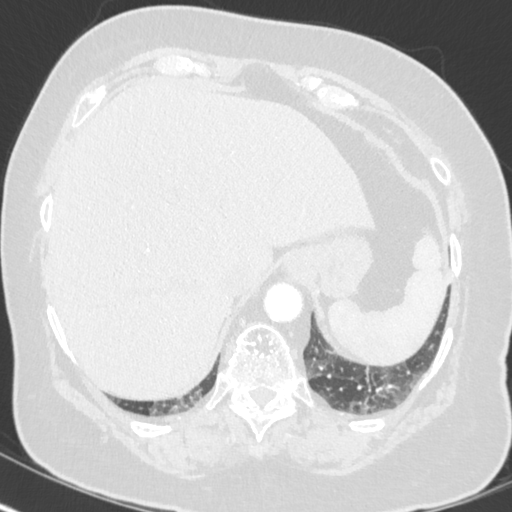
[im 42/188  mediastinal]
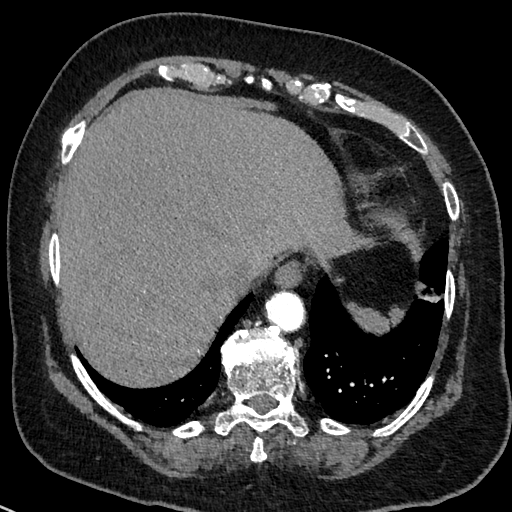
[im 52/188  lung]
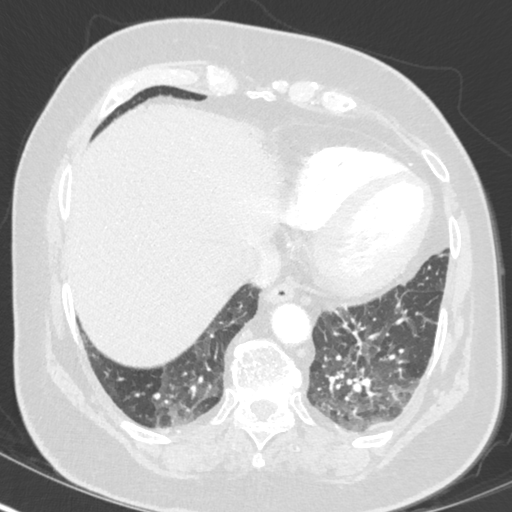
[im 63/188  mediastinal]
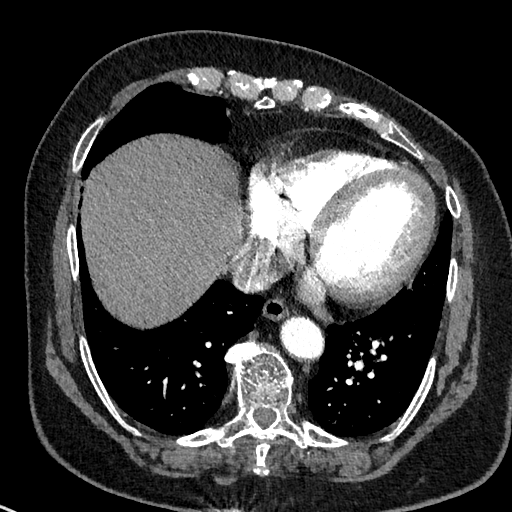
[im 73/188  lung]
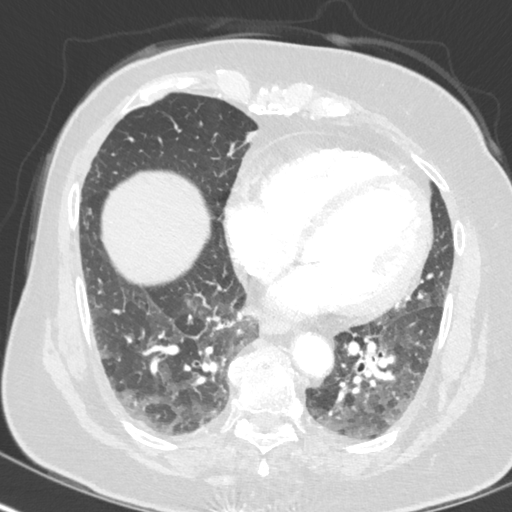
[im 84/188  mediastinal]
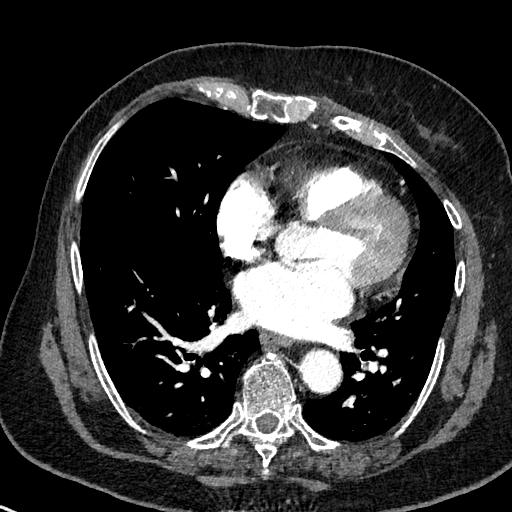
[im 94/188  lung]
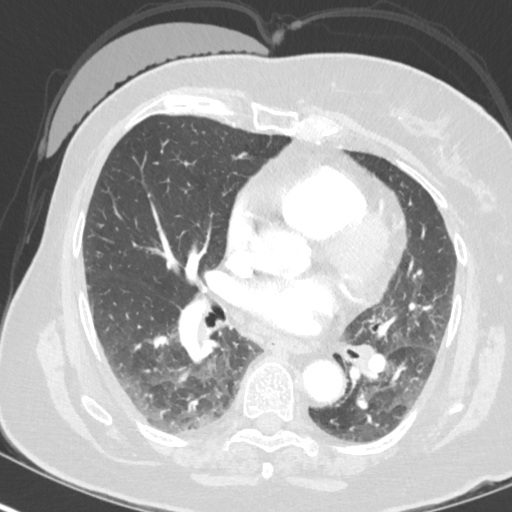
[im 104/188  mediastinal]
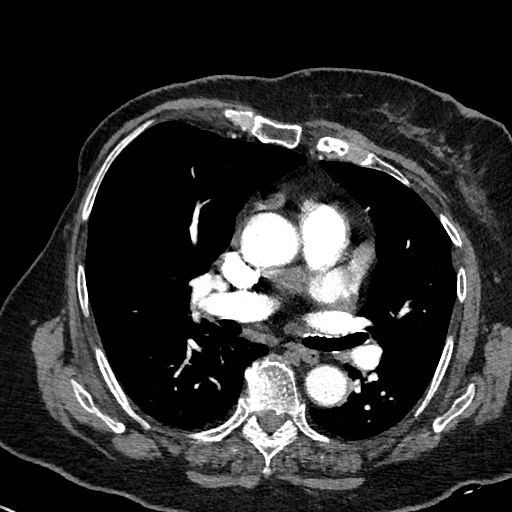
[im 115/188  lung]
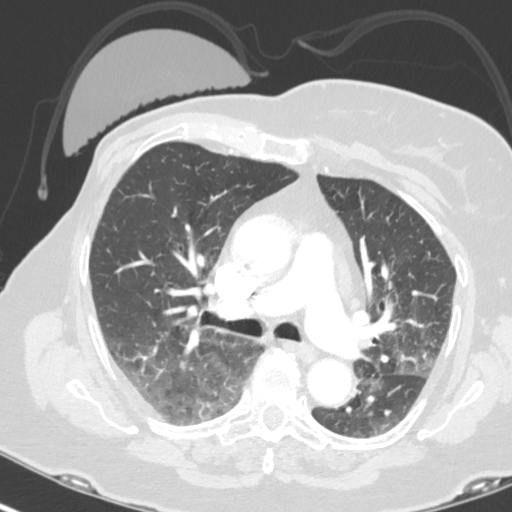
[im 125/188  mediastinal]
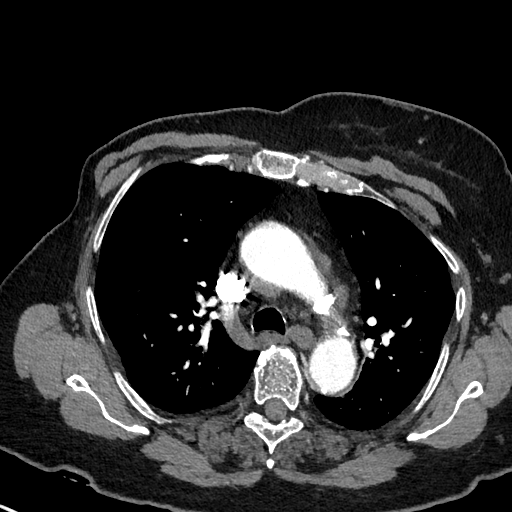
[im 136/188  lung]
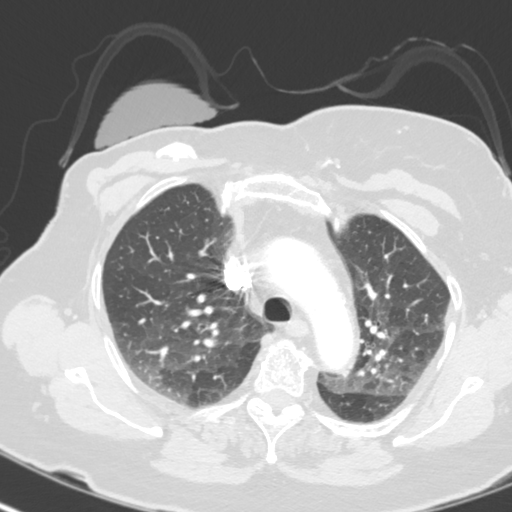
[im 146/188  mediastinal]
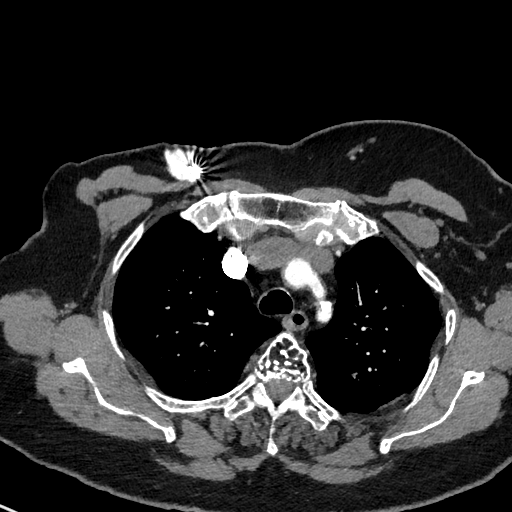
[im 156/188  lung]
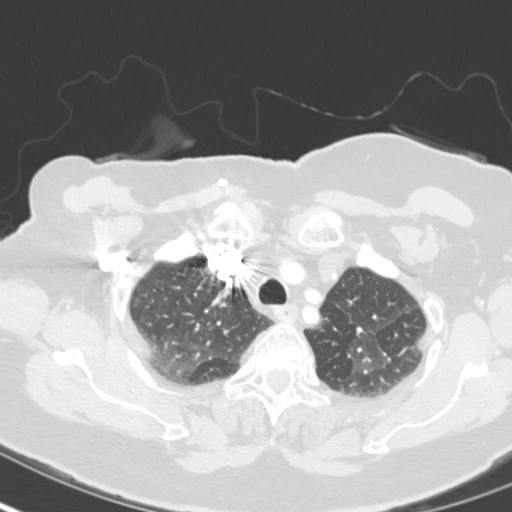
[im 167/188  mediastinal]
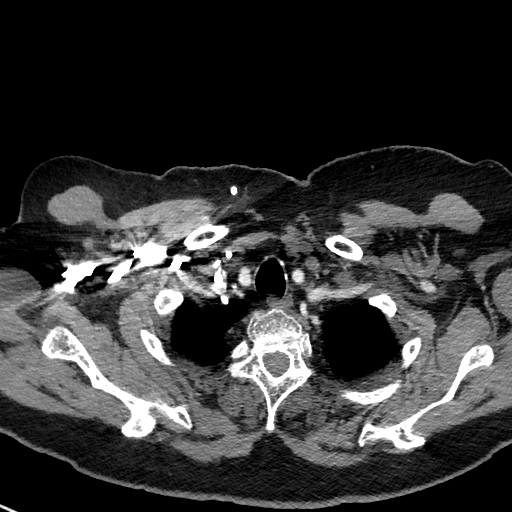
[im 177/188  lung]
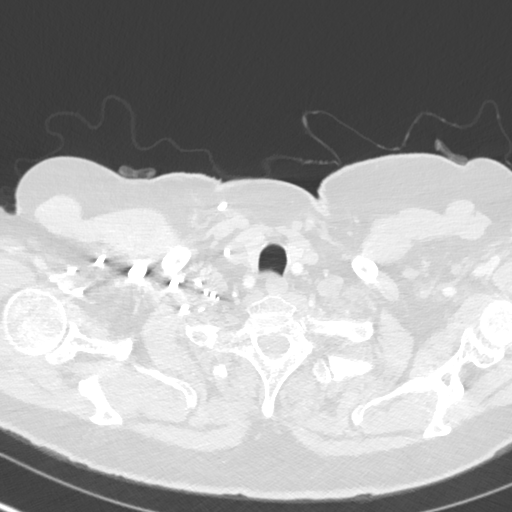

[Series 8: cor pe 2.0 mpr · coronal · 0.55mm/px · 1 of 137 slices shown]
[im 69/137  mediastinal]
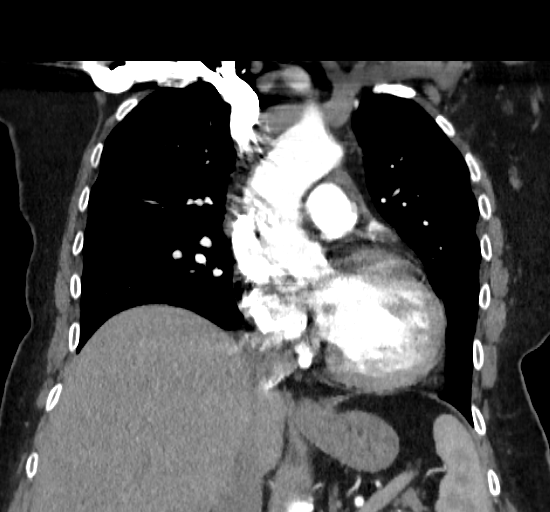

[18 of 36 positions shown; findings below may reference images not displayed]

FINDINGS: No pneumothorax or pleural effusion is noted. Mild bilateral
posterior basilar subsegmental atelectasis is noted. There is no
evidence of thoracic aortic dissection or aneurysm. Atherosclerosis
of thoracic aorta is noted. There is no definite evidence of
pulmonary embolus. Visualized portion of upper abdomen appears
normal. Hemangiomas are noted in multiple vertebral bodies of the
thoracic spine. No mediastinal mass or adenopathy is noted. Right
internal jugular Port-A-Cath is noted.

Review of the MIP images confirms the above findings.
IMPRESSION: No definite evidence of pulmonary embolus.

## 2016-09-29 ENCOUNTER — Inpatient Hospital Stay: Payer: Medicare Other

## 2016-09-29 ENCOUNTER — Inpatient Hospital Stay: Payer: Medicare Other | Admitting: Internal Medicine

## 2016-10-09 ENCOUNTER — Inpatient Hospital Stay: Payer: Medicare Other | Admitting: Internal Medicine

## 2016-10-09 ENCOUNTER — Inpatient Hospital Stay: Payer: Medicare Other

## 2016-10-16 ENCOUNTER — Other Ambulatory Visit: Payer: Self-pay | Admitting: Internal Medicine

## 2016-10-16 DIAGNOSIS — C50912 Malignant neoplasm of unspecified site of left female breast: Secondary | ICD-10-CM

## 2016-10-30 ENCOUNTER — Other Ambulatory Visit: Payer: Self-pay | Admitting: Nurse Practitioner

## 2016-11-10 ENCOUNTER — Other Ambulatory Visit: Payer: Medicare Other

## 2016-11-10 ENCOUNTER — Inpatient Hospital Stay: Payer: Medicare Other

## 2016-11-10 ENCOUNTER — Inpatient Hospital Stay: Payer: Medicare Other | Attending: Internal Medicine | Admitting: Oncology

## 2016-11-10 ENCOUNTER — Ambulatory Visit: Payer: Medicare Other | Admitting: Internal Medicine

## 2016-11-10 VITALS — BP 145/82 | HR 83 | Temp 98.6°F | Resp 18 | Wt 213.8 lb

## 2016-11-10 DIAGNOSIS — F419 Anxiety disorder, unspecified: Secondary | ICD-10-CM | POA: Diagnosis not present

## 2016-11-10 DIAGNOSIS — Z8619 Personal history of other infectious and parasitic diseases: Secondary | ICD-10-CM | POA: Insufficient documentation

## 2016-11-10 DIAGNOSIS — Z853 Personal history of malignant neoplasm of breast: Secondary | ICD-10-CM | POA: Diagnosis not present

## 2016-11-10 DIAGNOSIS — E785 Hyperlipidemia, unspecified: Secondary | ICD-10-CM | POA: Insufficient documentation

## 2016-11-10 DIAGNOSIS — M545 Low back pain: Secondary | ICD-10-CM | POA: Diagnosis not present

## 2016-11-10 DIAGNOSIS — C50412 Malignant neoplasm of upper-outer quadrant of left female breast: Secondary | ICD-10-CM | POA: Diagnosis not present

## 2016-11-10 DIAGNOSIS — I10 Essential (primary) hypertension: Secondary | ICD-10-CM | POA: Diagnosis not present

## 2016-11-10 DIAGNOSIS — Z170 Estrogen receptor positive status [ER+]: Secondary | ICD-10-CM | POA: Insufficient documentation

## 2016-11-10 DIAGNOSIS — Z79899 Other long term (current) drug therapy: Secondary | ICD-10-CM | POA: Diagnosis not present

## 2016-11-10 DIAGNOSIS — G629 Polyneuropathy, unspecified: Secondary | ICD-10-CM | POA: Insufficient documentation

## 2016-11-10 DIAGNOSIS — Z79811 Long term (current) use of aromatase inhibitors: Secondary | ICD-10-CM | POA: Diagnosis not present

## 2016-11-10 DIAGNOSIS — Z95828 Presence of other vascular implants and grafts: Secondary | ICD-10-CM

## 2016-11-10 DIAGNOSIS — Z9221 Personal history of antineoplastic chemotherapy: Secondary | ICD-10-CM | POA: Diagnosis not present

## 2016-11-10 DIAGNOSIS — Z86711 Personal history of pulmonary embolism: Secondary | ICD-10-CM | POA: Insufficient documentation

## 2016-11-10 DIAGNOSIS — Z7901 Long term (current) use of anticoagulants: Secondary | ICD-10-CM

## 2016-11-10 DIAGNOSIS — R251 Tremor, unspecified: Secondary | ICD-10-CM | POA: Diagnosis not present

## 2016-11-10 DIAGNOSIS — Z87891 Personal history of nicotine dependence: Secondary | ICD-10-CM

## 2016-11-10 DIAGNOSIS — Z452 Encounter for adjustment and management of vascular access device: Secondary | ICD-10-CM | POA: Diagnosis not present

## 2016-11-10 DIAGNOSIS — G8929 Other chronic pain: Secondary | ICD-10-CM

## 2016-11-10 DIAGNOSIS — G4700 Insomnia, unspecified: Secondary | ICD-10-CM

## 2016-11-10 DIAGNOSIS — F329 Major depressive disorder, single episode, unspecified: Secondary | ICD-10-CM | POA: Insufficient documentation

## 2016-11-10 DIAGNOSIS — M1990 Unspecified osteoarthritis, unspecified site: Secondary | ICD-10-CM | POA: Insufficient documentation

## 2016-11-10 DIAGNOSIS — J449 Chronic obstructive pulmonary disease, unspecified: Secondary | ICD-10-CM

## 2016-11-10 MED ORDER — SODIUM CHLORIDE 0.9% FLUSH
10.0000 mL | INTRAVENOUS | Status: DC | PRN
  Administered 2016-11-10: 10 mL via INTRAVENOUS
  Filled 2016-11-10: qty 10

## 2016-11-10 MED ORDER — HEPARIN SOD (PORK) LOCK FLUSH 100 UNIT/ML IV SOLN
500.0000 [IU] | Freq: Once | INTRAVENOUS | Status: AC
  Administered 2016-11-10: 500 [IU] via INTRAVENOUS

## 2016-11-10 NOTE — Progress Notes (Signed)
Patient is here for follow up, she mentions her hands are shaky wondering if this is due to the anastrozole. She still has neuropathy in her finger tips and she is feeling depressed.

## 2016-11-10 NOTE — Progress Notes (Signed)
Hematology/Oncology Consult note [facility]  Telephone:([phone] [phone]  Patient Care Team: [name], MD as PCP - General (Family Medicine)   Name of the patient: Heidi [name]  [id number]  1941/09/14   Date of visit: 11/10/16  Diagnosis- stage II left breast cancer ER/PR positive and HER-2/neu negative  Chief complaint/ Reason for visit- routine follow-up of breast cancer  Heme/Onc history:  Oncology History   # March 2016- LEFT BREAST CANCER STAGE II ER- 90%; PR-90%; Her 2 neu- NEG; NEO-ADJ CHEMO-  AC x4- Taxol x12 w;;s/p LUMPEC & SLNBx [ypT1 [1.5cm]; ypsN 3/6 (2 macro & 1 micro met)]  s/p RT [Nov 1st week;finish] NOV 2016- START FEMARA stop Feb 2017; April 2017- start Arimidex  # Right Breast cancer ? Stage;  s/p Mastec [1990;s/p Chemo; RT ]  # PN G-2 on neurontin; Hx PE on eliquis  # Mediport     Cancer of overlapping sites of left female breast ([place])   06/09/2016 Initial Diagnosis    Cancer of overlapping sites of left female breast [facility])        Interval history- Patient is tolerating her Arimidex well and reports no significant arthralgias. She does have chronic back pain which was evaluated by x-ray in the past which did not show any evidence of metastases. She has been going through physical therapy and reports that slowly getting better  ECOG PS- 2 Pain scale- 3-chronic back pain Opioid associated constipation- no  Review of systems- Review of Systems  Constitutional: Negative for chills, fever, malaise/fatigue and weight loss.  HENT: Negative for congestion, ear discharge and nosebleeds.   Eyes: Negative for blurred vision.  Respiratory: Negative for cough, hemoptysis, sputum production, shortness of breath and wheezing.   Cardiovascular: Negative for chest pain, palpitations, orthopnea and claudication.  Gastrointestinal: Negative for abdominal pain, blood in stool, constipation, diarrhea, heartburn, melena,  nausea and vomiting.  Genitourinary: Negative for dysuria, flank pain, frequency, hematuria and urgency.  Musculoskeletal: Positive for back pain. Negative for joint pain and myalgias.  Skin: Negative for rash.  Neurological: Positive for tremors. Negative for dizziness, tingling, focal weakness, seizures, weakness and headaches.  Endo/Heme/Allergies: Does not bruise/bleed easily.  Psychiatric/Behavioral: Negative for depression and suicidal ideas. The patient does not have insomnia.      Current treatment- Arimidex  Allergies  Allergen Reactions  . Diphenhydramine Other (See Comments)    pt states makes legs very restless and ache  . Hydrochloric Acid Hives    Other reaction(s): Blisters Uncoded Allergy. Allergen: HYDROCHLORLIC ACID.     Past Medical History:  Diagnosis Date  . Anemia   . Anxiety   . Breast cancer ([place]) 1990   RT MASTECTOMY  . Breast cancer ([place]) 2015   LT LUMPECTOMY  . C. difficile diarrhea    see dr Vira Agar and this is her second course of vancomycin  . Cataracts, bilateral   . COPD (chronic obstructive pulmonary disease) (HCC)    mild COPD  . Depression   . Hyperlipidemia   . Hypertension   . Insomnia   . Neuropathy due to chemotherapeutic drug ([place])   . Osteoarthritis   . Shortness of breath dyspnea   . UTI (lower urinary tract infection)      Past Surgical History:  Procedure Laterality Date  . BREAST BIOPSY Left 05/07/2015   Procedure: BREAST BIOPSY WITH NEEDLE LOCALIZATION;  Surgeon: Dia Crawford III, MD;  Location: [facility] ORS;  Service: General;  Laterality: Left;  .  BREAST LUMPECTOMY WITH SENTINEL LYMPH NODE BIOPSY Left 05/07/2015   Procedure: PARTIAL MASTECTOMY WITH SENTINEL LYMPH NODE BX;  Surgeon: Ralph Ely III, MD;  Location: [facility] ORS;  Service: General;  Laterality: Left;  . EYE SURGERY Bilateral    cataract extractions  . MASTECTOMY Right    1990's    Social History   Social History  . Marital status: Divorced    Spouse name: N/A    . Number of children: N/A  . Years of education: N/A   Occupational History  . Not on file.   Social History Main Topics  . Smoking status: Former Smoker    Packs/day: 2.00    Years: 25.00    Types: Cigarettes    Quit date: 04/28/1992  . Smokeless tobacco: Never Used     Comment: Pt quit 1995  . Alcohol use No  . Drug use: No  . Sexual activity: Not on file   Other Topics Concern  . Not on file   Social History Narrative  . No narrative on file    Family History  Problem Relation Age of Onset  . Dementia Mother   . Bladder Cancer Father   . Breast cancer Neg Hx      Current Outpatient Prescriptions:  .  anastrozole (ARIMIDEX) 1 MG tablet, TAKE 1 TABLET (1 MG TOTAL) BY MOUTH DAILY., Disp: 30 tablet, Rfl: 0 .  Calcium Carb-Cholecalciferol (CALCIUM 600 + D PO), Take 1 tablet by mouth daily., Disp: , Rfl:  .  diclofenac (VOLTAREN) 75 MG EC tablet, One pill every 12 hours as needed., Disp: 60 tablet, Rfl: 0 .  Fluticasone-Salmeterol (ADVAIR DISKUS) 250-50 MCG/DOSE AEPB, Inhale 1 puff into the lungs 2 (two) times daily. GARGLE AND RINSE AFTER EACH USE., Disp: 60 each, Rfl: 3 .  gabapentin (NEURONTIN) 300 MG capsule, TAKE 2 TABLETS BY MOUTH TWICE DAILY AS DIRECTED., Disp: 120 capsule, Rfl: 1 .  HYDROcodone-acetaminophen (NORCO/VICODIN) 5-325 MG tablet, Take 1 tablet by mouth every 8 (eight) hours as needed for moderate pain., Disp: 40 tablet, Rfl: 0 .  ibuprofen (ADVIL,MOTRIN) 200 MG tablet, Take 200 mg by mouth every 6 (six) hours as needed., Disp: , Rfl:  .  losartan (COZAAR) 50 MG tablet, Take by mouth., Disp: , Rfl:  .  Omega-3 Fatty Acids (FISH OIL) 500 MG CAPS, Take by mouth., Disp: , Rfl:  .  potassium chloride SA (K-DUR,KLOR-CON) 20 MEQ tablet, Take 1 tablet (20 mEq total) by mouth daily as needed. (Patient taking differently: Take 20 mEq by mouth daily. ), Disp: 30 tablet, Rfl: 3 .  sertraline (ZOLOFT) 100 MG tablet, Take 100 mg by mouth daily., Disp: , Rfl:  .   zolpidem (AMBIEN) 10 MG tablet, Take 1 tablet (10 mg total) by mouth at bedtime., Disp: 30 tablet, Rfl: 1 No current facility-administered medications for this visit.   Facility-Administered Medications Ordered in Other Visits:  .  sodium chloride 0.9 % injection 10 mL, 10 mL, Intravenous, PRN, Sandeep Pandit, MD .  sodium chloride 0.9 % injection 10 mL, 10 mL, Intravenous, PRN, Sandeep Pandit, MD, 10 mL at 06/23/15 1236 .  sodium chloride flush (NS) 0.9 % injection 10 mL, 10 mL, Intravenous, PRN, Govinda R Brahmanday, MD, 10 mL at 01/28/16 1421  Physical exam:  Vitals:   11/10/16 1505  BP: (!) 145/82  Pulse: 83  Resp: 18  Temp: 98.6 F (37 C)  TempSrc: Tympanic  Weight: 213 lb 13.5 oz (97 kg)   Physical Exam    Constitutional: She is oriented to person, place, and time and well-developed, well-nourished, and in no distress.  She walks with a stooping gait due to chronic back pain  HENT:  Head: Normocephalic and atraumatic.  Eyes: EOM are normal. Pupils are equal, round, and reactive to light.  Neck: Normal range of motion.  Cardiovascular: Normal rate, regular rhythm and normal heart sounds.   Pulmonary/Chest: Effort normal and breath sounds normal.  Abdominal: Soft. Bowel sounds are normal.  Neurological: She is alert and oriented to person, place, and time.  Bilateral hand tremors are noted  Skin: Skin is warm and dry.   Breast exam is performed in seated and lying down position. Patient is status right mastectomy without reconstruction. There is no evidence of any chest wall recurrence. No palpable masses in the left breast. No evidence of bilateral axillary adenopathy    CMP Latest Ref Rng & Units 09/01/2015  Glucose 65 - 99 mg/dL 104(H)  BUN 6 - 20 mg/dL 17  Creatinine 0.44 - 1.00 mg/dL 0.63  Sodium 135 - 145 mmol/L 135  Potassium 3.5 - 5.1 mmol/L 4.0  Chloride 101 - 111 mmol/L 103  CO2 22 - 32 mmol/L 26  Calcium 8.9 - 10.3 mg/dL 8.3(L)  Total Protein 6.5 - 8.1 g/dL 7.1   Total Bilirubin 0.3 - 1.2 mg/dL 0.5  Alkaline Phos 38 - 126 U/L 79  AST 15 - 41 U/L 15  ALT 14 - 54 U/L 13(L)   CBC Latest Ref Rng & Units 09/01/2015  WBC 3.6 - 11.0 K/uL 6.4  Hemoglobin 12.0 - 16.0 g/dL 11.7(L)  Hematocrit 35.0 - 47.0 % 35.0  Platelets 150 - 440 K/uL 229   Left-sided mammogram from June 2017 revealing no evidence of malignancy. Repeat mammogram was recommended in one year   Assessment and plan- Patient is a 75 y.o. female with a history of Stage II left breast cancer ER/PR positive HER-2/neu negative status post neoadjuvant chemotherapy followed by lumpectomy and radiation. She is currently on Arimidex  1. Breast cancer- continue her Arimidex for at least 5 but ideally 10 years. She will also continue calcium and vitamin D supplements and routine physical activity along with physical therapy. She has not had a baseline DEXA scan and we will obtain one today. I will see her back in 3 months time. If there are any concerns of the DEXA scan I will see her sooner to discuss bisphosphonate therapy. We will also order a left mammogram for June 2018.  2. Chemotherapy-induced peripheral neuropathy- stable continue to monitor  3. Bilateral hand tremors- I do not think that this is related to her Arimidex. This needs to be evaluated by her primary care doctor and consider possible referral to neurology   Visit Diagnosis 1. Malignant neoplasm of upper-outer quadrant of left breast in female, estrogen receptor positive (HCC)      Dr. Archana Rao, MD, MPH [facility] Pager- [phone] 11/10/2016 11:34 AM                

## 2016-11-13 ENCOUNTER — Other Ambulatory Visit: Payer: Self-pay | Admitting: *Deleted

## 2016-11-13 DIAGNOSIS — Z170 Estrogen receptor positive status [ER+]: Principal | ICD-10-CM

## 2016-11-13 DIAGNOSIS — C50812 Malignant neoplasm of overlapping sites of left female breast: Secondary | ICD-10-CM

## 2016-11-22 ENCOUNTER — Other Ambulatory Visit: Payer: Self-pay | Admitting: Internal Medicine

## 2016-11-22 DIAGNOSIS — C50912 Malignant neoplasm of unspecified site of left female breast: Secondary | ICD-10-CM

## 2016-11-27 ENCOUNTER — Other Ambulatory Visit: Payer: Self-pay | Admitting: *Deleted

## 2016-11-27 MED ORDER — GABAPENTIN 300 MG PO CAPS: ORAL_CAPSULE | ORAL | 1 refills | Status: DC

## 2016-12-18 ENCOUNTER — Ambulatory Visit: Payer: Medicare Other

## 2017-01-16 ENCOUNTER — Other Ambulatory Visit: Payer: Self-pay | Admitting: Oncology

## 2017-01-16 DIAGNOSIS — C50912 Malignant neoplasm of unspecified site of left female breast: Secondary | ICD-10-CM

## 2017-01-25 ENCOUNTER — Ambulatory Visit: Payer: Medicare Other

## 2017-02-08 ENCOUNTER — Telehealth: Payer: Self-pay | Admitting: *Deleted

## 2017-02-08 ENCOUNTER — Inpatient Hospital Stay: Payer: Medicare Other | Attending: Oncology | Admitting: Oncology

## 2017-02-08 VITALS — BP 138/84 | HR 88 | Temp 96.4°F | Resp 18 | Wt 216.1 lb

## 2017-02-08 DIAGNOSIS — Z8052 Family history of malignant neoplasm of bladder: Secondary | ICD-10-CM | POA: Diagnosis not present

## 2017-02-08 DIAGNOSIS — C50812 Malignant neoplasm of overlapping sites of left female breast: Secondary | ICD-10-CM | POA: Insufficient documentation

## 2017-02-08 DIAGNOSIS — G8929 Other chronic pain: Secondary | ICD-10-CM | POA: Insufficient documentation

## 2017-02-08 DIAGNOSIS — E785 Hyperlipidemia, unspecified: Secondary | ICD-10-CM | POA: Diagnosis not present

## 2017-02-08 DIAGNOSIS — Z79899 Other long term (current) drug therapy: Secondary | ICD-10-CM | POA: Insufficient documentation

## 2017-02-08 DIAGNOSIS — G620 Drug-induced polyneuropathy: Secondary | ICD-10-CM | POA: Diagnosis not present

## 2017-02-08 DIAGNOSIS — J449 Chronic obstructive pulmonary disease, unspecified: Secondary | ICD-10-CM | POA: Insufficient documentation

## 2017-02-08 DIAGNOSIS — M549 Dorsalgia, unspecified: Secondary | ICD-10-CM | POA: Diagnosis not present

## 2017-02-08 DIAGNOSIS — Z79811 Long term (current) use of aromatase inhibitors: Secondary | ICD-10-CM | POA: Insufficient documentation

## 2017-02-08 DIAGNOSIS — Z170 Estrogen receptor positive status [ER+]: Secondary | ICD-10-CM | POA: Insufficient documentation

## 2017-02-08 DIAGNOSIS — Z7901 Long term (current) use of anticoagulants: Secondary | ICD-10-CM | POA: Diagnosis not present

## 2017-02-08 DIAGNOSIS — Z9011 Acquired absence of right breast and nipple: Secondary | ICD-10-CM | POA: Diagnosis not present

## 2017-02-08 DIAGNOSIS — M1990 Unspecified osteoarthritis, unspecified site: Secondary | ICD-10-CM | POA: Diagnosis not present

## 2017-02-08 DIAGNOSIS — Z86711 Personal history of pulmonary embolism: Secondary | ICD-10-CM | POA: Diagnosis not present

## 2017-02-08 DIAGNOSIS — Z853 Personal history of malignant neoplasm of breast: Secondary | ICD-10-CM | POA: Diagnosis not present

## 2017-02-08 DIAGNOSIS — I10 Essential (primary) hypertension: Secondary | ICD-10-CM | POA: Insufficient documentation

## 2017-02-08 DIAGNOSIS — Z9221 Personal history of antineoplastic chemotherapy: Secondary | ICD-10-CM | POA: Insufficient documentation

## 2017-02-08 DIAGNOSIS — Z87891 Personal history of nicotine dependence: Secondary | ICD-10-CM | POA: Diagnosis not present

## 2017-02-08 DIAGNOSIS — T451X5S Adverse effect of antineoplastic and immunosuppressive drugs, sequela: Secondary | ICD-10-CM | POA: Insufficient documentation

## 2017-02-08 MED ORDER — ANASTROZOLE 1 MG PO TABS: 1.0000 mg | ORAL_TABLET | Freq: Every day | ORAL | 3 refills | Status: DC

## 2017-02-08 NOTE — Telephone Encounter (Signed)
Attempted to call patient to inform her we have made an appointment for her with [facility] to see Dr. Rosana Hoes on Wednesday, April 18 @ 2 PM to have her PAC removed.  She should arrive at 1:45 pm.  No answer and no availability to leave a message.  Will try calling again.

## 2017-02-08 NOTE — Progress Notes (Signed)
Hematology/Oncology Consult note [facility]  Telephone:([phone] [phone]  Patient Care Team: [name], MD as PCP - General (Family Medicine)   Name of the patient: Heidi [name]  [id number]  [birth year]   Date of visit: 02/08/17  Diagnosis- stage II left breast cancer ER/PR positive and HER-2/neu negative  Chief complaint/ Reason for visit- routine follow-up of breast cancer  Heme/Onc history:  Oncology History   # March 2016- LEFT BREAST CANCER STAGE II ER- 90%; PR-90%; Her 2 neu- NEG; NEO-ADJ CHEMO-  AC x4- Taxol x12 w;;s/p LUMPEC & SLNBx [ypT1 [1.5cm]; ypsN 3/6 (2 macro & 1 micro met)]  s/p RT [Nov 1st week;finish] NOV 2016- START FEMARA stop Feb 2017; April 2017- start Arimidex  # Right Breast cancer ? Stage;  s/p Mastec [1990;s/p Chemo; RT ]  # PN G-2 on neurontin; Hx PE on eliquis  # Mediport     Cancer of overlapping sites of left female breast ([place])   06/09/2016 Initial Diagnosis    Cancer of overlapping sites of left female breast [facility])        Interval history- Patient is tolerating her Arimidex well and reports no significant arthralgias. She continues to have chronic back pain which was evaluated by x-ray in the past which did not show any evidence of metastases. She has been going to Healthy Back classes and reports slow improvement.  She reports improved numbness and tingling in her fingertips and both feet after acupuncture.  She reports previous hand tremors have resolved.  ECOG PS- 2 Pain scale- 3-chronic back pain Opioid associated constipation- no  Review of systems- Review of Systems  Constitutional: Negative for chills, fever, malaise/fatigue and weight loss.  HENT: Negative for congestion, ear discharge and nosebleeds.   Eyes: Negative for blurred vision.  Respiratory: Negative for cough, hemoptysis, sputum production, shortness of breath and wheezing.   Cardiovascular: Negative for chest pain,  palpitations, orthopnea and claudication.  Gastrointestinal: Negative for abdominal pain, blood in stool, constipation, diarrhea, heartburn, melena, nausea and vomiting.  Genitourinary: Negative for dysuria, flank pain, frequency, hematuria and urgency.  Musculoskeletal: Positive for back pain. Negative for joint pain and myalgias.  Skin: Negative for rash.  Neurological: Positive for tingling and sensory change. Negative for dizziness, tremors, focal weakness, seizures, weakness and headaches.  Endo/Heme/Allergies: Does not bruise/bleed easily.  Psychiatric/Behavioral: Negative for depression and suicidal ideas. The patient does not have insomnia.      Current treatment- Arimidex  Allergies  Allergen Reactions  . Diphenhydramine Other (See Comments)    pt states makes legs very restless and ache  . Hydrochloric Acid Hives    Other reaction(s): Blisters Uncoded Allergy. Allergen: HYDROCHLORLIC ACID.     Past Medical History:  Diagnosis Date  . Anemia   . Anxiety   . Breast cancer ([place]) 1990   RT MASTECTOMY  . Breast cancer ([place]) 2015   LT LUMPECTOMY  . C. difficile diarrhea    see dr Vira Agar and this is her second course of vancomycin  . Cataracts, bilateral   . COPD (chronic obstructive pulmonary disease) (HCC)    mild COPD  . Depression   . Hyperlipidemia   . Hypertension   . Insomnia   . Neuropathy due to chemotherapeutic drug ([place])   . Osteoarthritis   . Shortness of breath dyspnea   . UTI (lower urinary tract infection)      Past Surgical History:  Procedure Laterality Date  . BREAST BIOPSY Left 05/07/2015  Procedure: BREAST BIOPSY WITH NEEDLE LOCALIZATION;  Surgeon: Dia Crawford III, MD;  Location: [facility] ORS;  Service: General;  Laterality: Left;  . BREAST LUMPECTOMY WITH SENTINEL LYMPH NODE BIOPSY Left 05/07/2015   Procedure: PARTIAL MASTECTOMY WITH SENTINEL LYMPH NODE BX;  Surgeon: Dia Crawford III, MD;  Location: [facility] ORS;  Service: General;  Laterality: Left;  .  EYE SURGERY Bilateral    cataract extractions  . MASTECTOMY Right    1990's    Social History   Social History  . Marital status: Divorced    Spouse name: N/A  . Number of children: N/A  . Years of education: N/A   Occupational History  . Not on file.   Social History Main Topics  . Smoking status: Former Smoker    Packs/day: 2.00    Years: 25.00    Types: Cigarettes    Quit date: 04/28/1992  . Smokeless tobacco: Never Used     Comment: Pt quit 1995  . Alcohol use No  . Drug use: No  . Sexual activity: Not on file   Other Topics Concern  . Not on file   Social History Narrative  . No narrative on file    Family History  Problem Relation Age of Onset  . Dementia Mother   . Bladder Cancer Father   . Breast cancer Neg Hx      Current Outpatient Prescriptions:  .  anastrozole (ARIMIDEX) 1 MG tablet, TAKE 1 TABLET (1 MG TOTAL) BY MOUTH DAILY., Disp: 30 tablet, Rfl: 0 .  Buchu-Junip-K Gluc-Pars-Uva Ur (WATER PILL/POTASSIUM EX ST PO), Take 2 tablets by mouth daily., Disp: , Rfl:  .  Calcium Carb-Cholecalciferol (CALCIUM 600 + D PO), Take 1 tablet by mouth daily., Disp: , Rfl:  .  gabapentin (NEURONTIN) 300 MG capsule, TAKE 2 TABLETS BY MOUTH TWICE DAILY AS DIRECTED., Disp: 120 capsule, Rfl: 1 .  ibuprofen (ADVIL,MOTRIN) 200 MG tablet, Take 200 mg by mouth every 6 (six) hours as needed., Disp: , Rfl:  .  losartan (COZAAR) 50 MG tablet, TAKE 1 TABLET (50 MG TOTAL) BY MOUTH ONCE DAILY., Disp: , Rfl:  .  RED YEAST RICE EXTRACT PO, Take 1,200 mg by mouth daily., Disp: , Rfl:  .  sertraline (ZOLOFT) 100 MG tablet, Take 200 mg by mouth daily. , Disp: , Rfl:  .  zolpidem (AMBIEN) 10 MG tablet, Take 1 tablet (10 mg total) by mouth at bedtime., Disp: 30 tablet, Rfl: 1 .  diclofenac (VOLTAREN) 75 MG EC tablet, One pill every 12 hours as needed. (Patient not taking: Reported on 11/10/2016), Disp: 60 tablet, Rfl: 0 .  Fluticasone-Salmeterol (ADVAIR DISKUS) 250-50 MCG/DOSE AEPB,  Inhale 1 puff into the lungs 2 (two) times daily. GARGLE AND RINSE AFTER EACH USE. (Patient not taking: Reported on 11/10/2016), Disp: 60 each, Rfl: 3 .  HYDROcodone-acetaminophen (NORCO/VICODIN) 5-325 MG tablet, Take 1 tablet by mouth every 8 (eight) hours as needed for moderate pain. (Patient not taking: Reported on 11/10/2016), Disp: 40 tablet, Rfl: 0 .  Omega-3 Fatty Acids (FISH OIL) 500 MG CAPS, Take by mouth., Disp: , Rfl:  .  potassium chloride SA (K-DUR,KLOR-CON) 20 MEQ tablet, Take 1 tablet (20 mEq total) by mouth daily as needed. (Patient not taking: Reported on 11/10/2016), Disp: 30 tablet, Rfl: 3 .  TURMERIC PO, Take by mouth., Disp: , Rfl:  .  vitamin B-12 (CYANOCOBALAMIN) 1000 MCG tablet, Take by mouth., Disp: , Rfl:  No current facility-administered medications for this visit.   Facility-Administered  Medications Ordered in Other Visits:  .  sodium chloride 0.9 % injection 10 mL, 10 mL, Intravenous, PRN, Leia Alf, MD .  sodium chloride 0.9 % injection 10 mL, 10 mL, Intravenous, PRN, Leia Alf, MD, 10 mL at 06/23/15 1236 .  sodium chloride flush (NS) 0.9 % injection 10 mL, 10 mL, Intravenous, PRN, Cammie Sickle, MD, 10 mL at 01/28/16 1421  Physical exam:  Vitals:   02/08/17 1432  BP: 138/84  Pulse: 88  Resp: 18  Temp: (!) 96.4 F (35.8 C)  TempSrc: Tympanic  Weight: 216 lb 2 oz (98 kg)   Physical Exam  Constitutional: She is oriented to person, place, and time and well-developed, well-nourished, and in no distress.  She walks with a stooping gait due to chronic back pain.  Uses a cane.  HENT:  Head: Normocephalic and atraumatic.  Eyes: EOM are normal. Pupils are equal, round, and reactive to light.  Neck: Normal range of motion.  Cardiovascular: Normal rate, regular rhythm and normal heart sounds.   Pulmonary/Chest: Effort normal and breath sounds normal.  Abdominal: Soft. Bowel sounds are normal.  Neurological: She is alert and oriented to person, place,  and time.  Skin: Skin is warm and dry.   Breast exam is performed in seated and lying down position. Patient is status right mastectomy without reconstruction. There is no evidence of any chest wall recurrence. No palpable masses in the left breast. No evidence of bilateral axillary adenopathy    CMP Latest Ref Rng & Units 09/01/2015  Glucose 65 - 99 mg/dL 104(H)  BUN 6 - 20 mg/dL 17  Creatinine 0.44 - 1.00 mg/dL 0.63  Sodium 135 - 145 mmol/L 135  Potassium 3.5 - 5.1 mmol/L 4.0  Chloride 101 - 111 mmol/L 103  CO2 22 - 32 mmol/L 26  Calcium 8.9 - 10.3 mg/dL 8.3(L)  Total Protein 6.5 - 8.1 g/dL 7.1  Total Bilirubin 0.3 - 1.2 mg/dL 0.5  Alkaline Phos 38 - 126 U/L 79  AST 15 - 41 U/L 15  ALT 14 - 54 U/L 13(L)   CBC Latest Ref Rng & Units 09/01/2015  WBC 3.6 - 11.0 K/uL 6.4  Hemoglobin 12.0 - 16.0 g/dL 11.7(L)  Hematocrit 35.0 - 47.0 % 35.0  Platelets 150 - 440 K/uL 229   Left-sided mammogram from June 2017 revealing no evidence of malignancy. Repeat mammogram was recommended in one year   Assessment and plan- Patient is a 76 y.o. female with a history of Stage II left breast cancer ER/PR positive HER-2/neu negative status post neoadjuvant chemotherapy followed by lumpectomy and radiation. She is currently on Arimidex  1. Breast cancer- continue her Arimidex for at least 5 but ideally 10 years.  She started hormone therapy in November 2016.  She will also continue calcium and vitamin D supplements and routine physical activity along with physical therapy.  DEXA scan is scheduled on 03/01/2017.  Left mammogram is scheduled on 04/02/2017.  She will see Dr. Janese Banks again in 6 months. If there are any concerns from the DEXA scan she will be seen sooner to discuss bisphosphonate therapy.   2. Chemotherapy-induced peripheral neuropathy- stable continue to monitor  3. Bilateral hand tremors- resolved.   Visit Diagnosis Malignant neoplasm of overlapping sites of left breast in female, estrogen  receptor positive  Lucendia Herrlich, NP  02/08/17 3:20 PM

## 2017-02-08 NOTE — Progress Notes (Signed)
Patient her today for 3 month f/u regarding breast cancer.

## 2017-02-09 ENCOUNTER — Telehealth: Payer: Self-pay | Admitting: *Deleted

## 2017-02-09 ENCOUNTER — Encounter: Payer: Self-pay | Admitting: Oncology

## 2017-02-09 NOTE — Telephone Encounter (Signed)
Pt was contacted about her appt to have port removed at [facility] on 4/18 at 2 pm  I gave her the address and telephone # and explained she needed to arrive there 15 min. Early. She is agreeable to the plan.

## 2017-02-14 ENCOUNTER — Ambulatory Visit (INDEPENDENT_AMBULATORY_CARE_PROVIDER_SITE_OTHER): Payer: Medicare Other | Admitting: Surgery

## 2017-02-14 ENCOUNTER — Encounter: Payer: Self-pay | Admitting: Surgery

## 2017-02-14 VITALS — BP 152/82 | HR 105 | Temp 97.4°F | Ht 67.0 in | Wt 216.0 lb

## 2017-02-14 DIAGNOSIS — Z08 Encounter for follow-up examination after completed treatment for malignant neoplasm: Secondary | ICD-10-CM

## 2017-02-14 DIAGNOSIS — Z853 Personal history of malignant neoplasm of breast: Secondary | ICD-10-CM | POA: Diagnosis not present

## 2017-02-14 NOTE — Patient Instructions (Signed)
We have seen you today to discuss port removal.   We have scheduled your port removal for 03/07/17 with Dr. Rosana Hoes at [facility].  Please see your blue surgical sheet for additional information.

## 2017-02-14 NOTE — Progress Notes (Signed)
Surgical Clinic Progress/Follow-up Note   HPI:  76 y.o. yo Female presents to clinic for follow-up evaluation and removal of Right IJ Power Port, placed 09/21/2014 by Dr. Pat Patrick for breast cancer chemotherapy, also s/p Left partial mastectomy with sentinel node biopsy (05/07/2015). Patient reports the port was last used for chemotherapy in 2016 and has since continued to be flushed every 6 weeks. She states that she was recently told she no longer required the port and denies any peri-incisional/port pain, erythema, drainage or any fever/chills, CP, or SOB.  Review of Systems:  Constitutional: denies any other weight loss, fever, chills, or sweats  Eyes: denies any other vision changes, history of eye injury  ENT: denies sore throat, hearing problems  Respiratory: denies shortness of breath, wheezing  Cardiovascular: denies chest pain, palpitations  Gastrointestinal: denies abdominal pain, N/V, or diarrhea Musculoskeletal: denies any other joint pains or cramps  Skin: Denies any other rashes or skin discolorations  Neurological: denies any other headache, dizziness, weakness  Psychiatric: denies any other depression, anxiety  All other review of systems: otherwise negative   Vital Signs:  BP (!) 152/82   Pulse (!) 105   Temp 97.4 F (36.3 C) (Oral)   Ht 5\' 7"  (1.702 m)   Wt 216 lb (98 kg)   BMI 33.83 kg/m    Physical Exam:  Constitutional:  -- Normal body habitus  -- Awake, alert, and oriented x3  Eyes:  -- Pupils equally round and reactive to light  -- No scleral icterus  Ear, nose, throat:  -- No jugular venous distension  -- No nasal drainage, bleeding Pulmonary:  -- No crackles  -- No dullness to percussion  Cardiovascular:  -- S1, S2 present  -- No pericardial rubs  Gastrointestinal:  -- Soft, nontender, nondistended, no guarding/rebound  -- No abdominal masses appreciated, pulsatile or otherwise  Musculoskeletal / Integumentary:  -- Wounds or skin discoloration:  Well-healed Right chest and Right neck surgical port insertion incisions without erythema or drainage  -- Extremities: B/L UE and LE FROM, hands and feet warm, no edema  Neurologic:  -- Motor function: intact and symmetric  -- Sensation: intact and symmetric   Laboratory studies:  CBC:  Lab Results  Component Value Date   WBC 6.4 09/01/2015   RBC 3.64 (L) 09/01/2015   BMP:  Lab Results  Component Value Date   GLUCOSE 104 (H) 09/01/2015   GLUCOSE 124 (H) 02/19/2015   CO2 26 09/01/2015   CO2 25 02/19/2015   BUN 17 09/01/2015   BUN 11 02/19/2015   CREATININE 0.63 09/01/2015   CREATININE 0.46 02/19/2015   CALCIUM 8.3 (L) 09/01/2015   CALCIUM 8.5 (L) 02/19/2015     Imaging: No new pertinent imaging studies  Assessment:  76 y.o. yo Female with a problem list including...  Patient Active Problem List   Diagnosis Date Noted  . Cancer of overlapping sites of left female breast ([place]) 06/09/2016  . Back pain 06/09/2016  . Acute bilateral thoracic back pain 06/09/2016  . Acute lumbar back pain 06/09/2016  . Insomnia 07/22/2015  . Osteoarthrosis involving lower leg 07/22/2015  . Preop cardiovascular exam 05/05/2015  . Abnormal stress test 03/01/2015  . Right subsegmental pulmonary embolus 02/23/2015  . COPD type A ([place]) 02/23/2015  . Breast cancer ([place]) 02/16/2015  . Smoking history 02/16/2015  . Essential hypertension 02/16/2015  . Obesity 02/16/2015  . History of chemotherapy 02/16/2015  . DOE (dyspnea on exertion) 01/21/2015  . S/P YAG capsulotomy 02/12/2013  .  History of breast cancer 11/04/2012  . Recurrent major depressive disorder, in remission ([place]) 06/01/2011    presents to clinic for follow-up evaluation and removal of Right IJ Power Port, complicated by comorbidities including COPD, HTN, obesity (BMI 34), breast cancer, prior PE, chronic back pain, osteoarthritis, and major depression disorder.  Plan:   - all risks, benefits, and alternatives to removal of no  longer required tunneled Right IJ central venous catheter with subcutaneous port were discussed with the patient, who elects to proceed, and informed consent was accordingly obtained   - will plan for removal of indwelling Right IJ central venous catheter with subcutaneous port  - surgical follow-up 2 weeks after above planned procedure  All of the above recommendations were discussed with patient, and all of patient's questions were answered to her expressed satisfaction.  -- Marilynne Drivers Rosana Hoes, MD, [place]: [facility] - Partnering for exceptional care. Office: [phone]

## 2017-02-16 ENCOUNTER — Telehealth: Payer: Self-pay

## 2017-02-16 NOTE — Telephone Encounter (Signed)
error 

## 2017-02-16 NOTE — Telephone Encounter (Signed)
Patient has been advised of Surgery Date as well as Pre-Admission appointment date, time, and location.  Surgery Date: 03/07/17  Pre-admit Appointment: 02/26/17 at 0945am  Patient has been advised to call [phone] the day before surgery between 1-3pm to obtain arrival time.

## 2017-02-16 NOTE — Telephone Encounter (Signed)
No authorization required for CPT code [phone] per [facility].

## 2017-02-17 ENCOUNTER — Other Ambulatory Visit: Payer: Self-pay | Admitting: Oncology

## 2017-02-26 ENCOUNTER — Encounter
Admission: RE | Admit: 2017-02-26 | Discharge: 2017-02-26 | Disposition: A | Discharge status: Home / Self Care | Payer: Medicare Other | Source: Ambulatory Visit | Attending: Surgery | Admitting: Surgery

## 2017-02-26 DIAGNOSIS — C50812 Malignant neoplasm of overlapping sites of left female breast: Secondary | ICD-10-CM | POA: Diagnosis not present

## 2017-02-26 DIAGNOSIS — M810 Age-related osteoporosis without current pathological fracture: Secondary | ICD-10-CM | POA: Diagnosis not present

## 2017-02-26 DIAGNOSIS — Z170 Estrogen receptor positive status [ER+]: Secondary | ICD-10-CM | POA: Diagnosis not present

## 2017-02-26 DIAGNOSIS — I10 Essential (primary) hypertension: Secondary | ICD-10-CM | POA: Diagnosis not present

## 2017-02-26 DIAGNOSIS — R9431 Abnormal electrocardiogram [ECG] [EKG]: Secondary | ICD-10-CM | POA: Diagnosis not present

## 2017-02-26 HISTORY — DX: Dyspnea, unspecified: R06.00

## 2017-02-26 HISTORY — DX: Inflammatory liver disease, unspecified: K75.9

## 2017-02-26 NOTE — Patient Instructions (Signed)
  Your procedure is scheduled on:Wednesday May, 9 , 2018. Report to Same Day Surgery. To find out your arrival time please call [phone] between 1PM - 3PM on Tuesday Mar 06, 2017.  Remember: Instructions that are not followed completely may result in serious medical risk, up to and including death, or upon the discretion of your surgeon and anesthesiologist your surgery may need to be rescheduled.    _x___ 1. Do not eat food or drink liquids after midnight. No gum chewing or hard candies.     ____ 2. No Alcohol for 24 hours before or after surgery.   ____ 3. Bring all medications with you on the day of surgery if instructed.    __x__ 4. Notify your doctor if there is any change in your medical condition     (cold, fever, infections).    _____ 5. No smoking 24 hours prior to surgery.     Do not wear jewelry, make-up, hairpins, clips or nail polish.  Do not wear lotions, powders, or perfumes.   Do not shave 48 hours prior to surgery. Men may shave face and neck.  Do not bring valuables to the hospital.    [facility] is not responsible for any belongings or valuables.               Contacts, dentures or bridgework may not be worn into surgery.  Leave your suitcase in the car. After surgery it may be brought to your room.  For patients admitted to the hospital, discharge time is determined by your treatment team.   Patients discharged the day of surgery will not be allowed to drive home.    Please read over the following fact sheets that you were given:   [facility] Preparing for Surgery  __x__ Take these medicines the morning of surgery with A SIP OF WATER:    1. losartan (COZAAR)    ____ Fleet Enema (as directed)   _x___ Use CHG Soap as directed on instruction sheet  ____ Use inhalers on the day of surgery and bring to hospital day of surgery  ____ Stop metformin 2 days prior to surgery    ____ Take 1/2 of usual insulin dose the night before surgery and none on the  morning of surgery.   ____ Stop Coumadin/Plavix/aspirin on does not apply.  __x__ Stop Anti-inflammatories such as Advil, Aleve, Ibuprofen, Motrin, Naproxen, Naprosyn, Goodies powders or aspirin products. OK to take  Tylenol.   _x_ Stop supplements: Coenzyme Q10-Red Yeast Rice:  until after surgery.    ____ Bring C-Pap to the hospital.

## 2017-02-26 NOTE — Pre-Procedure Instructions (Signed)
Compared today's EKG to one done on 04/29/15, no change except septal infarct no longer noted.

## 2017-03-01 ENCOUNTER — Ambulatory Visit
Admission: RE | Admit: 2017-03-01 | Discharge: 2017-03-01 | Disposition: A | Discharge status: Home / Self Care | Payer: Medicare Other | Source: Ambulatory Visit | Attending: Oncology | Admitting: Oncology

## 2017-03-01 DIAGNOSIS — M810 Age-related osteoporosis without current pathological fracture: Secondary | ICD-10-CM | POA: Insufficient documentation

## 2017-03-01 DIAGNOSIS — C50812 Malignant neoplasm of overlapping sites of left female breast: Secondary | ICD-10-CM | POA: Diagnosis not present

## 2017-03-01 DIAGNOSIS — Z170 Estrogen receptor positive status [ER+]: Secondary | ICD-10-CM | POA: Insufficient documentation

## 2017-03-01 DIAGNOSIS — R9431 Abnormal electrocardiogram [ECG] [EKG]: Secondary | ICD-10-CM | POA: Insufficient documentation

## 2017-03-01 DIAGNOSIS — I10 Essential (primary) hypertension: Secondary | ICD-10-CM | POA: Insufficient documentation

## 2017-03-03 ENCOUNTER — Encounter: Payer: Self-pay | Admitting: Surgery

## 2017-03-03 ENCOUNTER — Encounter: Payer: Self-pay | Admitting: Internal Medicine

## 2017-03-06 MED ORDER — CEFAZOLIN SODIUM-DEXTROSE 2-4 GM/100ML-% IV SOLN
2.0000 g | INTRAVENOUS | Status: AC
  Administered 2017-03-07: 2 g via INTRAVENOUS

## 2017-03-07 ENCOUNTER — Ambulatory Visit: Payer: Medicare Other | Admitting: Certified Registered Nurse Anesthetist

## 2017-03-07 ENCOUNTER — Ambulatory Visit
Admission: RE | Admit: 2017-03-07 | Discharge: 2017-03-07 | Disposition: A | Discharge status: Home / Self Care | Payer: Medicare Other | Source: Ambulatory Visit | Attending: Surgery | Admitting: Surgery

## 2017-03-07 ENCOUNTER — Encounter
Admission: RE | Disposition: A | Discharge status: Home / Self Care | Payer: Self-pay | Source: Ambulatory Visit | Attending: Surgery

## 2017-03-07 ENCOUNTER — Encounter: Payer: Self-pay | Admitting: *Deleted

## 2017-03-07 DIAGNOSIS — Z9221 Personal history of antineoplastic chemotherapy: Secondary | ICD-10-CM | POA: Insufficient documentation

## 2017-03-07 DIAGNOSIS — Z853 Personal history of malignant neoplasm of breast: Secondary | ICD-10-CM | POA: Diagnosis not present

## 2017-03-07 DIAGNOSIS — Z791 Long term (current) use of non-steroidal anti-inflammatories (NSAID): Secondary | ICD-10-CM | POA: Diagnosis not present

## 2017-03-07 DIAGNOSIS — E669 Obesity, unspecified: Secondary | ICD-10-CM | POA: Diagnosis not present

## 2017-03-07 DIAGNOSIS — Z86711 Personal history of pulmonary embolism: Secondary | ICD-10-CM | POA: Insufficient documentation

## 2017-03-07 DIAGNOSIS — F339 Major depressive disorder, recurrent, unspecified: Secondary | ICD-10-CM | POA: Diagnosis not present

## 2017-03-07 DIAGNOSIS — Z923 Personal history of irradiation: Secondary | ICD-10-CM | POA: Diagnosis not present

## 2017-03-07 DIAGNOSIS — Z79899 Other long term (current) drug therapy: Secondary | ICD-10-CM | POA: Diagnosis not present

## 2017-03-07 DIAGNOSIS — Z452 Encounter for adjustment and management of vascular access device: Secondary | ICD-10-CM | POA: Diagnosis not present

## 2017-03-07 DIAGNOSIS — Z79811 Long term (current) use of aromatase inhibitors: Secondary | ICD-10-CM | POA: Insufficient documentation

## 2017-03-07 DIAGNOSIS — Z6832 Body mass index (BMI) 32.0-32.9, adult: Secondary | ICD-10-CM | POA: Diagnosis not present

## 2017-03-07 DIAGNOSIS — I10 Essential (primary) hypertension: Secondary | ICD-10-CM | POA: Insufficient documentation

## 2017-03-07 HISTORY — PX: PORT-A-CATH REMOVAL: SHX5289

## 2017-03-07 SURGERY — REMOVAL PORT-A-CATH
Anesthesia: General | Laterality: Right

## 2017-03-07 MED ORDER — CHLORHEXIDINE GLUCONATE CLOTH 2 % EX PADS: 6.0000 | MEDICATED_PAD | Freq: Once | CUTANEOUS | Status: DC

## 2017-03-07 MED ORDER — LIDOCAINE HCL (PF) 2 % IJ SOLN
INTRAMUSCULAR | Status: AC
  Filled 2017-03-07: qty 2

## 2017-03-07 MED ORDER — FAMOTIDINE 20 MG PO TABS
20.0000 mg | ORAL_TABLET | Freq: Once | ORAL | Status: AC
  Administered 2017-03-07: 20 mg via ORAL

## 2017-03-07 MED ORDER — MIDAZOLAM HCL 2 MG/2ML IJ SOLN
INTRAMUSCULAR | Status: AC
  Filled 2017-03-07: qty 2

## 2017-03-07 MED ORDER — VASOPRESSIN 20 UNIT/ML IV SOLN
INTRAVENOUS | Status: DC | PRN
  Administered 2017-03-07 (×2): 1 [IU] via INTRAVENOUS

## 2017-03-07 MED ORDER — CEFAZOLIN SODIUM-DEXTROSE 2-4 GM/100ML-% IV SOLN
INTRAVENOUS | Status: AC
  Filled 2017-03-07: qty 100

## 2017-03-07 MED ORDER — GLYCOPYRROLATE 0.2 MG/ML IJ SOLN
INTRAMUSCULAR | Status: AC
  Filled 2017-03-07: qty 1

## 2017-03-07 MED ORDER — PROPOFOL 10 MG/ML IV BOLUS
INTRAVENOUS | Status: AC
  Filled 2017-03-07: qty 20

## 2017-03-07 MED ORDER — KETAMINE HCL 50 MG/ML IJ SOLN
INTRAMUSCULAR | Status: DC | PRN
  Administered 2017-03-07: 25 mg via INTRAVENOUS

## 2017-03-07 MED ORDER — BUPIVACAINE HCL (PF) 0.5 % IJ SOLN
INTRAMUSCULAR | Status: AC
  Filled 2017-03-07: qty 30

## 2017-03-07 MED ORDER — LACTATED RINGERS IV SOLN
INTRAVENOUS | Status: DC
  Administered 2017-03-07: 13:00:00 via INTRAVENOUS

## 2017-03-07 MED ORDER — LIDOCAINE HCL (PF) 1 % IJ SOLN
INTRAMUSCULAR | Status: AC
  Filled 2017-03-07: qty 30

## 2017-03-07 MED ORDER — FAMOTIDINE 20 MG PO TABS
ORAL_TABLET | ORAL | Status: AC
  Administered 2017-03-07: 20 mg via ORAL
  Filled 2017-03-07: qty 1

## 2017-03-07 MED ORDER — ONDANSETRON HCL 4 MG/2ML IJ SOLN: 4.0000 mg | Freq: Once | INTRAMUSCULAR | Status: DC | PRN

## 2017-03-07 MED ORDER — FENTANYL CITRATE (PF) 100 MCG/2ML IJ SOLN
INTRAMUSCULAR | Status: AC
Start: 2017-03-07 — End: 2017-03-07
  Filled 2017-03-07: qty 2

## 2017-03-07 MED ORDER — LIDOCAINE HCL 1 % IJ SOLN
INTRAMUSCULAR | Status: DC | PRN
  Administered 2017-03-07: 9 mL via INTRAMUSCULAR

## 2017-03-07 MED ORDER — FENTANYL CITRATE (PF) 100 MCG/2ML IJ SOLN: 25.0000 ug | INTRAMUSCULAR | Status: DC | PRN

## 2017-03-07 MED ORDER — PROPOFOL 500 MG/50ML IV EMUL
INTRAVENOUS | Status: DC | PRN
  Administered 2017-03-07: 80 ug/kg/min via INTRAVENOUS

## 2017-03-07 MED ORDER — MIDAZOLAM HCL 2 MG/2ML IJ SOLN
INTRAMUSCULAR | Status: DC | PRN
  Administered 2017-03-07: 1 mg via INTRAVENOUS

## 2017-03-07 MED ORDER — PROPOFOL 10 MG/ML IV BOLUS
INTRAVENOUS | Status: DC | PRN
  Administered 2017-03-07 (×3): 16 mg via INTRAVENOUS

## 2017-03-07 SURGICAL SUPPLY — 34 items
APPLIER CLIP 9.375 SM OPEN (CLIP)
BAG DECANTER FOR FLEXI CONT (MISCELLANEOUS) IMPLANT
BAG HAMPER (MISCELLANEOUS) IMPLANT
BLADE SURG 15 STRL LF DISP TIS (BLADE) ×1 IMPLANT
BLADE SURG 15 STRL SS (BLADE) ×2
BLADE SURG SZ11 CARB STEEL (BLADE) ×3 IMPLANT
CHLORAPREP W/TINT 10.5 ML (MISCELLANEOUS) ×3 IMPLANT
CHLORAPREP W/TINT 26ML (MISCELLANEOUS) ×3 IMPLANT
CLIP APPLIE 9.375 SM OPEN (CLIP) IMPLANT
COVER LIGHT HANDLE STERIS (MISCELLANEOUS) IMPLANT
DECANTER SPIKE VIAL GLASS SM (MISCELLANEOUS) ×6 IMPLANT
DERMABOND ADVANCED (GAUZE/BANDAGES/DRESSINGS) ×2
DERMABOND ADVANCED .7 DNX12 (GAUZE/BANDAGES/DRESSINGS) ×1 IMPLANT
DRAPE C-ARM 42X70 (DRAPES) IMPLANT
DRAPE LAPAROTOMY 77X122 PED (DRAPES) ×3 IMPLANT
ELECT REM PT RETURN 9FT ADLT (ELECTROSURGICAL) ×3
ELECTRODE REM PT RTRN 9FT ADLT (ELECTROSURGICAL) ×1 IMPLANT
GLOVE BIOGEL PI IND STRL 7.0 (GLOVE) ×1 IMPLANT
GLOVE BIOGEL PI IND STRL 7.5 (GLOVE) ×1 IMPLANT
GLOVE BIOGEL PI INDICATOR 7.0 (GLOVE) ×2
GLOVE BIOGEL PI INDICATOR 7.5 (GLOVE) ×2
GLOVE ECLIPSE 7.0 STRL STRAW (GLOVE) IMPLANT
GOWN STRL REUS W/TWL LRG LVL3 (GOWN DISPOSABLE) ×6 IMPLANT
IV NS 500ML (IV SOLUTION)
IV NS 500ML BAXH (IV SOLUTION) IMPLANT
KIT RM TURNOVER STRD PROC AR (KITS) ×3 IMPLANT
NEEDLE HYPO 25X1 1.5 SAFETY (NEEDLE) ×3 IMPLANT
PACK BASIN MINOR ARMC (MISCELLANEOUS) ×3 IMPLANT
PAD ARMBOARD 7.5X6 YLW CONV (MISCELLANEOUS) IMPLANT
SUT MNCRL AB 4-0 PS2 18 (SUTURE) ×3 IMPLANT
SUT VIC AB 3-0 SH 27 (SUTURE) ×2
SUT VIC AB 3-0 SH 27X BRD (SUTURE) ×1 IMPLANT
SYR 10ML LL (SYRINGE) ×3 IMPLANT
SYR 20CC LL (SYRINGE) IMPLANT

## 2017-03-07 NOTE — Anesthesia Preprocedure Evaluation (Signed)
Anesthesia Evaluation  Patient identified by MRN, date of birth, ID band Patient awake    Reviewed: Allergy & Precautions, H&P , NPO status , Patient's Chart, lab work & pertinent test results, reviewed documented beta blocker date and time   History of Anesthesia Complications Negative for: history of anesthetic complications  Airway Mallampati: III  TM Distance: >3 FB Neck ROM: full    Dental  (+) Missing, Caps, Dental Advidsory Given, Poor Dentition   Pulmonary shortness of breath and with exertion, neg sleep apnea, COPD (mild), neg recent URI, former smoker,           Cardiovascular Exercise Tolerance: Good hypertension, (-) angina+ DOE  (-) CAD, (-) Past MI, (-) Cardiac Stents and (-) CABG (-) dysrhythmias (-) Valvular Problems/Murmurs     Neuro/Psych neg Seizures PSYCHIATRIC DISORDERS (Depression)  Neuromuscular disease (neuropathy from chemo)    GI/Hepatic negative GI ROS, Neg liver ROS,   Endo/Other  negative endocrine ROS  Renal/GU negative Renal ROS  negative genitourinary   Musculoskeletal   Abdominal   Peds  Hematology negative hematology ROS (+)   Anesthesia Other Findings Past Medical History: No date: Anemia No date: Anxiety 1990: Breast cancer ([place])     Comment: RT MASTECTOMY 2015: Breast cancer ([place])     Comment: LT LUMPECTOMY No date: C. difficile diarrhea     Comment: see dr Vira Agar and this is her second course               of vancomycin No date: Cataracts, bilateral No date: COPD (chronic obstructive pulmonary disease) (*     Comment: mild COPD No date: Depression No date: Dyspnea     Comment: with exersion 1960's: Hepatitis     Comment: does not know which type No date: Hyperlipidemia No date: Hypertension No date: Insomnia No date: Neuropathy due to chemotherapeutic drug (HCC) No date: Osteoarthritis No date: UTI (lower urinary tract infection)    Reproductive/Obstetrics negative OB ROS                             Anesthesia Physical Anesthesia Plan  ASA: II  Anesthesia Plan: General   Post-op Pain Management:    Induction:   Airway Management Planned:   Additional Equipment:   Intra-op Plan:   Post-operative Plan:   Informed Consent: I have reviewed the patients History and Physical, chart, labs and discussed the procedure including the risks, benefits and alternatives for the proposed anesthesia with the patient or authorized representative who has indicated his/her understanding and acceptance.   Dental Advisory Given  Plan Discussed with: Anesthesiologist, CRNA and Surgeon  Anesthesia Plan Comments:         Anesthesia Quick Evaluation

## 2017-03-07 NOTE — Anesthesia Post-op Follow-up Note (Cosign Needed)
Anesthesia QCDR form completed.        

## 2017-03-07 NOTE — Discharge Instructions (Addendum)
AMBULATORY SURGERY  DISCHARGE INSTRUCTIONS   1) The drugs that you were given will stay in your system until tomorrow so for the next 24 hours you should not:  A) Drive an automobile B) Make any legal decisions C) Drink any alcoholic beverage   2) You may resume regular meals tomorrow.  Today it is better to start with liquids and gradually work up to solid foods.  You may eat anything you prefer, but it is better to start with liquids, then soup and crackers, and gradually work up to solid foods.   3) Please notify your doctor immediately if you have any unusual bleeding, trouble breathing, redness and pain at the surgery site, drainage, fever, or pain not relieved by medication. 4)   5) Your post-operative visit with Dr.                                     is: Date:                        Time:    Please call to schedule your post-operative visit.  6) Additional Instructions:     In addition to included general post-operative instructions for Central Venous Catheter (removal),  Diet: Resume home heart healthy diet.   Activity: No heavy lifting >20 pounds (children, pets, laundry, garbage) or strenuous activity until follow-up, but light activity and walking are encouraged. Do not drive or drink alcohol if taking narcotic pain medications.  Wound care: 2 days after surgery (Friday, 5/11), may shower/get incision wet with soapy water and pat dry (do not rub incisions), but no baths or submerging incision underwater until follow-up.   Medications: Resume all home medications. For mild to moderate pain: acetaminophen (Tylenol) or ibuprofen (if no kidney disease). Combining Tylenol with alcohol can substantially increase your risk of causing liver disease. Narcotic pain medications, if prescribed, can be used for severe pain, though may cause nausea, constipation, and drowsiness. Do not combine Tylenol and Percocet within a 6 hour period as Percocet contains Tylenol. If you do not  need the narcotic pain medication, you do not need to fill the prescription.  Call office [phone]) at any time if any questions, worsening pain, fevers/chills, bleeding, drainage from incision site, or other concerns.

## 2017-03-07 NOTE — Op Note (Signed)
SURGICAL OPERATIVE REPORT  DATE: 03/07/2017  ATTENDING Surgeon(s): Vickie Epley, MD  ANESTHESIA: Local with light IV sedation  PRE-OPERATIVE DIAGNOSIS: Tunneled Right IJ central venous catheter with subcutaneous port inserted for Left breast cancer chemotherapy, no longer needed (icd-10's: Z45.2, C50.922)  POST-OPERATIVE DIAGNOSIS: Tunneled Right IJ central venous catheter with subcutaenous port inserted for Left breast cancer chemotherapy, no longer needed (icd-10's: Z45.2, C50.922)  PROCEDURE(S):  1.) Removal of tunneled Right IJ central venous catheter with subcutaneous port (cpt: 36590)  INTRAOPERATIVE FINDINGS: Well-incorporated tunneled Right subclavian central venous catheter with subcutaneous port without erythema or purulence  INTRAVENOUS FLUIDS: 400 mL crystalloid   ESTIMATED BLOOD LOSS: Minimal (< 20 mL)  URINE OUTPUT: No foley   SPECIMENS: No Specimen  IMPLANTS: None  DRAINS: none  COMPLICATIONS: None apparent  CONDITION AT END OF PROCEDURE: Hemodynamically stable and awake  DISPOSITION OF PATIENT: Recovery  INDICATIONS FOR PROCEDURE:  76 year old Female s/p Left partial mastectomy with radiation and chemotherapy via Right IJ central venous catheter with subcutaneous port presented for evaluation and removal of her no longer needed tunneled Right IJ central venous catheter with subcutaneous port. Her chemotherapy port was last used for chemotherapy 2016, and it has since continued to be flushed every 6 weeks. She reports the port was never infected and always functioned well, but since no longer needed, she wishes to have the port removed. All risks, benefits, and alternatives to above procedure were discussed with the patient, all of patient's questions were answered to his expressed satisfaction, and informed consent was obtained and documented.  DETAILS OF PROCEDURE: Patient was brought to the operating suite and appropriately identified. In supine position,  operative site was prepped and draped in the usual sterile fashion, and following a brief time out, mixed lidocaine and marcaine were injected in the subcutaneous tissue overlying the port, a 2 cm transverse linear incision was made and extended to 2.5 cm to accommodate the port. A combination of blunt dissection, sharp dissection, and electrocautery were used to free the port and attached catheter from its surrounding capsule and tissues. The port was then able to be removed from its subcutaneous pocket, the catheter was confirmed to slide freely, and the catheter was removed with immediate focal pressure applied to the venous insertion site for hemostasis. Hemostasis was then confirmed, the subcutaneous pocket was copiously irrigated, and the wound was then closed in layers, using 3-0 Vicryl buried interrupted suture to re-approximate dermis. Additional local anesthetic was injected subcutaneously along the length of the incision, and a running subcuticular 4-0 Monocryl suture was used to re-approximate epidermis. Skin was then cleaned, dried, and sterile Dermabond skin glue was applied and allowed to dry. Patient was then safely able to be transferred to recovery for post-operative monitoring and care.  I was present for all aspects of the above procedure, and there were no complications apparent.

## 2017-03-07 NOTE — Transfer of Care (Signed)
Immediate Anesthesia Transfer of Care Note  Patient: Heidi [name]  Procedure(s) Performed: Procedure(s): REMOVAL PORT-A-CATH (Right)  Patient Location: PACU  Anesthesia Type:General  Level of Consciousness: awake, alert , oriented and patient cooperative  Airway & Oxygen Therapy: Patient Spontanous Breathing  Post-op Assessment: Report given to RN and Post -op Vital signs reviewed and stable  Post vital signs: Reviewed and stable  Last Vitals:  Vitals:   03/07/17 1120  BP: (!) 144/79  Pulse: 85  Resp: 18  Temp: 37 C    Last Pain:  Vitals:   03/07/17 1120  TempSrc: Oral  PainSc: 1          Complications: No apparent anesthesia complications

## 2017-03-07 NOTE — Interval H&P Note (Signed)
History and Physical Interval Note:  03/07/2017 12:47 PM  Heidi [name]  has presented today for surgery, with the diagnosis of Breast cancer  The various methods of treatment have been discussed with the patient and family. After consideration of risks, benefits and other options for treatment, the patient has consented to  Procedure(s): REMOVAL PORT-A-CATH (Right) as a surgical intervention .  The patient's history has been reviewed, patient examined, no change in status, stable for surgery.  I have reviewed the patient's chart and labs.  Questions were answered to the patient's satisfaction.     Vickie Epley

## 2017-03-07 NOTE — H&P (View-Only) (Signed)
Surgical Clinic Progress/Follow-up Note   HPI:  76 y.o. yo Female presents to clinic for follow-up evaluation and removal of Right IJ Power Port, placed 09/21/2014 by Dr. Pat Patrick for breast cancer chemotherapy, also s/p Left partial mastectomy with sentinel node biopsy (05/07/2015). Patient reports the port was last used for chemotherapy in 2016 and has since continued to be flushed every 6 weeks. She states that she was recently told she no longer required the port and denies any peri-incisional/port pain, erythema, drainage or any fever/chills, CP, or SOB.  Review of Systems:  Constitutional: denies any other weight loss, fever, chills, or sweats  Eyes: denies any other vision changes, history of eye injury  ENT: denies sore throat, hearing problems  Respiratory: denies shortness of breath, wheezing  Cardiovascular: denies chest pain, palpitations  Gastrointestinal: denies abdominal pain, N/V, or diarrhea Musculoskeletal: denies any other joint pains or cramps  Skin: Denies any other rashes or skin discolorations  Neurological: denies any other headache, dizziness, weakness  Psychiatric: denies any other depression, anxiety  All other review of systems: otherwise negative   Vital Signs:  BP (!) 152/82   Pulse (!) 105   Temp 97.4 F (36.3 C) (Oral)   Ht 5\' 7"  (1.702 m)   Wt 216 lb (98 kg)   BMI 33.83 kg/m    Physical Exam:  Constitutional:  -- Normal body habitus  -- Awake, alert, and oriented x3  Eyes:  -- Pupils equally round and reactive to light  -- No scleral icterus  Ear, nose, throat:  -- No jugular venous distension  -- No nasal drainage, bleeding Pulmonary:  -- No crackles  -- No dullness to percussion  Cardiovascular:  -- S1, S2 present  -- No pericardial rubs  Gastrointestinal:  -- Soft, nontender, nondistended, no guarding/rebound  -- No abdominal masses appreciated, pulsatile or otherwise  Musculoskeletal / Integumentary:  -- Wounds or skin discoloration:  Well-healed Right chest and Right neck surgical port insertion incisions without erythema or drainage  -- Extremities: B/L UE and LE FROM, hands and feet warm, no edema  Neurologic:  -- Motor function: intact and symmetric  -- Sensation: intact and symmetric   Laboratory studies:  CBC:  Lab Results  Component Value Date   WBC 6.4 09/01/2015   RBC 3.64 (L) 09/01/2015   BMP:  Lab Results  Component Value Date   GLUCOSE 104 (H) 09/01/2015   GLUCOSE 124 (H) 02/19/2015   CO2 26 09/01/2015   CO2 25 02/19/2015   BUN 17 09/01/2015   BUN 11 02/19/2015   CREATININE 0.63 09/01/2015   CREATININE 0.46 02/19/2015   CALCIUM 8.3 (L) 09/01/2015   CALCIUM 8.5 (L) 02/19/2015     Imaging: No new pertinent imaging studies  Assessment:  76 y.o. yo Female with a problem list including...  Patient Active Problem List   Diagnosis Date Noted  . Cancer of overlapping sites of left female breast ([place]) 06/09/2016  . Back pain 06/09/2016  . Acute bilateral thoracic back pain 06/09/2016  . Acute lumbar back pain 06/09/2016  . Insomnia 07/22/2015  . Osteoarthrosis involving lower leg 07/22/2015  . Preop cardiovascular exam 05/05/2015  . Abnormal stress test 03/01/2015  . Right subsegmental pulmonary embolus 02/23/2015  . COPD type A ([place]) 02/23/2015  . Breast cancer ([place]) 02/16/2015  . Smoking history 02/16/2015  . Essential hypertension 02/16/2015  . Obesity 02/16/2015  . History of chemotherapy 02/16/2015  . DOE (dyspnea on exertion) 01/21/2015  . S/P YAG capsulotomy 02/12/2013  .  History of breast cancer 11/04/2012  . Recurrent major depressive disorder, in remission ([place]) 06/01/2011    presents to clinic for follow-up evaluation and removal of Right IJ Power Port, complicated by comorbidities including COPD, HTN, obesity (BMI 34), breast cancer, prior PE, chronic back pain, osteoarthritis, and major depression disorder.  Plan:   - all risks, benefits, and alternatives to removal of no  longer required tunneled Right IJ central venous catheter with subcutaneous port were discussed with the patient, who elects to proceed, and informed consent was accordingly obtained   - will plan for removal of indwelling Right IJ central venous catheter with subcutaneous port  - surgical follow-up 2 weeks after above planned procedure  All of the above recommendations were discussed with patient, and all of patient's questions were answered to her expressed satisfaction.  -- Marilynne Drivers Rosana Hoes, MD, [place]: [facility] - Partner[facility] for exceptional care. Office: [phone]

## 2017-03-08 NOTE — Transfer of Care (Signed)
Immediate Anesthesia Transfer of Care Note  Patient: Heidi [name]  Procedure(s) Performed: Procedure(s): REMOVAL PORT-A-CATH (Right)  Patient Location: PACU  Anesthesia Type:General  Level of Consciousness: awake  Airway & Oxygen Therapy: Patient Spontanous Breathing and Patient connected to face mask oxygen  Post-op Assessment: Report given to RN and Post -op Vital signs reviewed and stable  Post vital signs: Reviewed and stable  Last Vitals:  Vitals:   03/07/17 1445 03/07/17 1518  BP: (!) 137/91 (!) 159/72  Pulse: 77 79  Resp: 16   Temp: 36.4 C     Last Pain:  Vitals:   03/07/17 1445  TempSrc: Temporal  PainSc:          Complications: No apparent anesthesia complications

## 2017-03-08 NOTE — Anesthesia Post-op Follow-up Note (Cosign Needed)
Anesthesia QCDR form completed.        

## 2017-03-09 ENCOUNTER — Inpatient Hospital Stay: Payer: Medicare Other | Attending: Oncology | Admitting: Oncology

## 2017-03-09 ENCOUNTER — Encounter: Payer: Self-pay | Admitting: Surgery

## 2017-03-09 VITALS — BP 129/83 | HR 89 | Temp 98.9°F | Resp 18 | Ht 67.0 in | Wt 212.0 lb

## 2017-03-09 DIAGNOSIS — G8929 Other chronic pain: Secondary | ICD-10-CM | POA: Diagnosis not present

## 2017-03-09 DIAGNOSIS — T451X5S Adverse effect of antineoplastic and immunosuppressive drugs, sequela: Secondary | ICD-10-CM | POA: Insufficient documentation

## 2017-03-09 DIAGNOSIS — Z79811 Long term (current) use of aromatase inhibitors: Secondary | ICD-10-CM | POA: Diagnosis not present

## 2017-03-09 DIAGNOSIS — G620 Drug-induced polyneuropathy: Secondary | ICD-10-CM | POA: Insufficient documentation

## 2017-03-09 DIAGNOSIS — M810 Age-related osteoporosis without current pathological fracture: Secondary | ICD-10-CM | POA: Insufficient documentation

## 2017-03-09 DIAGNOSIS — C50812 Malignant neoplasm of overlapping sites of left female breast: Secondary | ICD-10-CM | POA: Diagnosis not present

## 2017-03-09 DIAGNOSIS — Z79899 Other long term (current) drug therapy: Secondary | ICD-10-CM | POA: Insufficient documentation

## 2017-03-09 DIAGNOSIS — Z8052 Family history of malignant neoplasm of bladder: Secondary | ICD-10-CM | POA: Diagnosis not present

## 2017-03-09 DIAGNOSIS — Z853 Personal history of malignant neoplasm of breast: Secondary | ICD-10-CM | POA: Diagnosis not present

## 2017-03-09 DIAGNOSIS — J449 Chronic obstructive pulmonary disease, unspecified: Secondary | ICD-10-CM | POA: Insufficient documentation

## 2017-03-09 DIAGNOSIS — M1990 Unspecified osteoarthritis, unspecified site: Secondary | ICD-10-CM | POA: Diagnosis not present

## 2017-03-09 DIAGNOSIS — I10 Essential (primary) hypertension: Secondary | ICD-10-CM | POA: Insufficient documentation

## 2017-03-09 DIAGNOSIS — E785 Hyperlipidemia, unspecified: Secondary | ICD-10-CM

## 2017-03-09 DIAGNOSIS — Z9221 Personal history of antineoplastic chemotherapy: Secondary | ICD-10-CM

## 2017-03-09 DIAGNOSIS — F329 Major depressive disorder, single episode, unspecified: Secondary | ICD-10-CM | POA: Diagnosis not present

## 2017-03-09 DIAGNOSIS — Z87891 Personal history of nicotine dependence: Secondary | ICD-10-CM | POA: Insufficient documentation

## 2017-03-09 DIAGNOSIS — R5383 Other fatigue: Secondary | ICD-10-CM | POA: Insufficient documentation

## 2017-03-09 DIAGNOSIS — M549 Dorsalgia, unspecified: Secondary | ICD-10-CM | POA: Diagnosis not present

## 2017-03-09 DIAGNOSIS — Z9011 Acquired absence of right breast and nipple: Secondary | ICD-10-CM | POA: Insufficient documentation

## 2017-03-09 DIAGNOSIS — Z923 Personal history of irradiation: Secondary | ICD-10-CM | POA: Insufficient documentation

## 2017-03-09 DIAGNOSIS — Z170 Estrogen receptor positive status [ER+]: Secondary | ICD-10-CM | POA: Insufficient documentation

## 2017-03-09 DIAGNOSIS — Z86711 Personal history of pulmonary embolism: Secondary | ICD-10-CM | POA: Diagnosis not present

## 2017-03-09 MED ORDER — DULOXETINE HCL 30 MG PO CPEP: 30.0000 mg | ORAL_CAPSULE | Freq: Every day | ORAL | 0 refills | Status: DC

## 2017-03-09 MED ORDER — ALENDRONATE SODIUM 70 MG PO TABS: 70.0000 mg | ORAL_TABLET | ORAL | 5 refills | Status: DC

## 2017-03-09 NOTE — Progress Notes (Signed)
Pt would like to speak to md for changing from lexapro to cymbalta for help with peripheral neuropathy. She states it is better but it is still there. Also her back hurts which is nothing new for her. She goes to back class.

## 2017-03-09 NOTE — Anesthesia Postprocedure Evaluation (Signed)
Anesthesia Post Note  Patient: Heidi [name]  Procedure(s) Performed: Procedure(s) (LRB): REMOVAL PORT-A-CATH (Right)  Patient location during evaluation: PACU Anesthesia Type: General Level of consciousness: awake and alert Pain management: pain level controlled Vital Signs Assessment: post-procedure vital signs reviewed and stable Respiratory status: spontaneous breathing, nonlabored ventilation, respiratory function stable and patient connected to nasal cannula oxygen Cardiovascular status: blood pressure returned to baseline and stable Postop Assessment: no signs of nausea or vomiting Anesthetic complications: no     Last Vitals:  Vitals:   03/07/17 1445 03/07/17 1518  BP: (!) 137/91 (!) 159/72  Pulse: 77 79  Resp: 16   Temp: 36.4 C     Last Pain:  Vitals:   03/07/17 1445  TempSrc: Temporal  PainSc:                  Martha Clan

## 2017-03-09 NOTE — Progress Notes (Signed)
Hematology/Oncology Consult note [facility]  [phone] [phone]  Patient Care Team: [name], MD as PCP - General (Family Medicine)   Name of the patient: Heidi [name]  [id number]  03-11-1941   Date of visit: 03/09/17  Diagnosis- stage II left breast cancer ER/PR positive and HER-2/neu negative  Chief complaint/ Reason for visit- discuss dexa scan results  Heme/Onc history:  Oncology History   # March 2016- LEFT BREAST CANCER STAGE II ER- 90%; PR-90%; Her 2 neu- NEG; NEO-ADJ CHEMO-  AC x4- Taxol x12 w;;s/p LUMPEC & SLNBx [ypT1 [1.5cm]; ypsN 3/6 (2 macro & 1 micro met)]  s/p RT [Nov 1st week;finish] NOV 2016- START FEMARA stop Feb 2017; April 2017- start Arimidex  # Right Breast cancer ? Stage;  s/p Mastec [1990;s/p Chemo; RT ]  # PN G-2 on neurontin; Hx PE on eliquis  # Mediport     Cancer of overlapping sites of left female breast ([place])   06/09/2016 Initial Diagnosis    Cancer of overlapping sites of left female breast ([place])        Interval history- feels fatigued. Has chronic back pain  Review of systems- Review of Systems  Constitutional: Negative for chills, fever, malaise/fatigue and weight loss.  HENT: Negative for congestion, ear discharge and nosebleeds.   Eyes: Negative for blurred vision.  Respiratory: Negative for cough, hemoptysis, sputum production, shortness of breath and wheezing.   Cardiovascular: Negative for chest pain, palpitations, orthopnea and claudication.  Gastrointestinal: Negative for abdominal pain, blood in stool, constipation, diarrhea, heartburn, melena, nausea and vomiting.  Genitourinary: Negative for dysuria, flank pain, frequency, hematuria and urgency.  Musculoskeletal: Positive for back pain. Negative for joint pain and myalgias.  Skin: Negative for rash.  Neurological: Negative for dizziness, tingling, focal weakness, seizures, weakness and headaches.  Endo/Heme/Allergies:  Does not bruise/bleed easily.  Psychiatric/Behavioral: Negative for depression and suicidal ideas. The patient does not have insomnia.       Allergies  Allergen Reactions  . Diphenhydramine Other (See Comments)    pt states makes legs very restless and ache  . Hydrochloric Acid Hives    Other reaction(s): Blisters Uncoded Allergy. Allergen: HYDROCHLORLIC ACID.     Past Medical History:  Diagnosis Date  . Anemia   . Anxiety   . Breast cancer ([place]) 1990   RT MASTECTOMY  . Breast cancer ([place]) 2015   LT LUMPECTOMY  . C. difficile diarrhea    see dr Vira Agar and this is her second course of vancomycin  . Cataracts, bilateral   . COPD (chronic obstructive pulmonary disease) ([facility])    mild COPD  . Depression   . Dyspnea    with exersion  . Hepatitis 1960's   does not know which type  . Hyperlipidemia   . Hypertension   . Insomnia   . Neuropathy due to chemotherapeutic drug ([place])   . Osteoarthritis   . UTI (lower urinary tract infection)      Past Surgical History:  Procedure Laterality Date  . APPENDECTOMY  1970's  . BREAST BIOPSY Left 05/07/2015   Procedure: BREAST BIOPSY WITH NEEDLE LOCALIZATION;  Surgeon: Dia Crawford III, MD;  Location: [facility] ORS;  Service: General;  Laterality: Left;  . BREAST LUMPECTOMY WITH SENTINEL LYMPH NODE BIOPSY Left 05/07/2015   Procedure: PARTIAL MASTECTOMY WITH SENTINEL LYMPH NODE BX;  Surgeon: Dia Crawford III, MD;  Location: [facility] ORS;  Service: General;  Laterality: Left;  . BREAST SURGERY Right 1990  mastectomy  . EYE SURGERY Bilateral    cataract extractions  . MASTECTOMY Right    1990's  . PORT-A-CATH REMOVAL Right 03/07/2017   Procedure: REMOVAL PORT-A-CATH;  Surgeon: Vickie Epley, MD;  Location: [facility] ORS;  Service: General;  Laterality: Right;    Social History   Social History  . Marital status: Divorced    Spouse name: N/A  . Number of children: N/A  . Years of education: N/A   Occupational History  . Not on file.   Social  History Main Topics  . Smoking status: Former Smoker    Packs/day: 2.00    Years: 25.00    Types: Cigarettes    Quit date: 04/28/1992  . Smokeless tobacco: Never Used     Comment: Pt quit 1995  . Alcohol use No  . Drug use: No  . Sexual activity: Not on file   Other Topics Concern  . Not on file   Social History Narrative  . No narrative on file    Family History  Problem Relation Age of Onset  . Dementia Mother   . Bladder Cancer Father   . Diabetes Sister   . Breast cancer Neg Hx      Current Outpatient Prescriptions:  .  anastrozole (ARIMIDEX) 1 MG tablet, Take 1 tablet (1 mg total) by mouth daily. (Patient taking differently: Take 1 mg by mouth every evening. ), Disp: 90 tablet, Rfl: 3 .  BucAlfAspKGlucCouchParsUvaUrJu (WATER PILLS PO), Take 1-2 tablets by mouth daily as needed (for fluid retention)., Disp: , Rfl:  .  Cholecalciferol (VITAMIN D3) 10000 units TABS, Take 10,000 Units by mouth every evening., Disp: , Rfl:  .  Coenzyme Q10-Red Yeast Rice 60-600 MG CAPS, Take 2 capsules by mouth every evening., Disp: , Rfl:  .  gabapentin (NEURONTIN) 300 MG capsule, TAKE 2 CAPSULES TWICE A DAY AS DIRECTED (Patient taking differently: TAKE 2 CAPSULES (600 MG) TWICE A DAY), Disp: 120 capsule, Rfl: 1 .  losartan (COZAAR) 50 MG tablet, Take 50 mg by mouth daily., Disp: , Rfl:  .  Multiple Vitamin (MULTIVITAMIN WITH MINERALS) TABS tablet, Take 1 tablet by mouth at bedtime. Senior Multivitamin, Disp: , Rfl:  .  naproxen sodium (ANAPROX) 220 MG tablet, Take 220 mg by mouth 2 (two) times daily as needed (for pain.)., Disp: , Rfl:  .  Oyster Shell (OYSTER CALCIUM) 500 MG TABS tablet, Take 500 mg of elemental calcium by mouth 2 (two) times daily., Disp: , Rfl:  .  sertraline (ZOLOFT) 100 MG tablet, Take 100 mg by mouth 2 (two) times daily., Disp: , Rfl:  .  zolpidem (AMBIEN) 10 MG tablet, Take 1 tablet (10 mg total) by mouth at bedtime., Disp: 30 tablet, Rfl: 1 No current  facility-administered medications for this visit.   Facility-Administered Medications Ordered in Other Visits:  .  sodium chloride 0.9 % injection 10 mL, 10 mL, Intravenous, PRN, Ma Hillock, Sandeep, MD .  sodium chloride 0.9 % injection 10 mL, 10 mL, Intravenous, PRN, Leia Alf, MD, 10 mL at 06/23/15 1236 .  sodium chloride flush (NS) 0.9 % injection 10 mL, 10 mL, Intravenous, PRN, Cammie Sickle, MD, 10 mL at 01/28/16 1421  Physical exam:  Vitals:   03/09/17 1550  BP: 129/83  Pulse: 89  Resp: 18  Temp: 98.9 F (37.2 C)  TempSrc: Tympanic  Weight: 212 lb (96.2 kg)  Height: _0  (1.702 m)   Physical Exam  Constitutional: She is oriented to person, place, and  time and well-developed, well-nourished, and in no distress.  HENT:  Head: Normocephalic and atraumatic.  Eyes: EOM are normal. Pupils are equal, round, and reactive to light.  Neck: Normal range of motion.  Cardiovascular: Normal rate, regular rhythm and normal heart sounds.   Pulmonary/Chest: Effort normal and breath sounds normal.  Abdominal: Soft. Bowel sounds are normal.  Neurological: She is alert and oriented to person, place, and time.  Skin: Skin is warm and dry.     CMP Latest Ref Rng & Units 09/01/2015  Glucose 65 - 99 mg/dL 104(H)  BUN 6 - 20 mg/dL 17  Creatinine 0.44 - 1.00 mg/dL 0.63  Sodium 135 - 145 mmol/L 135  Potassium 3.5 - 5.1 mmol/L 4.0  Chloride 101 - 111 mmol/L 103  CO2 22 - 32 mmol/L 26  Calcium 8.9 - 10.3 mg/dL 8.3(L)  Total Protein 6.5 - 8.1 g/dL 7.1  Total Bilirubin 0.3 - 1.2 mg/dL 0.5  Alkaline Phos 38 - 126 U/L 79  AST 15 - 41 U/L 15  ALT 14 - 54 U/L 13(L)   CBC Latest Ref Rng & Units 09/01/2015  WBC 3.6 - 11.0 K/uL 6.4  Hemoglobin 12.0 - 16.0 g/dL 11.7(L)  Hematocrit 35.0 - 47.0 % 35.0  Platelets 150 - 440 K/uL 229    No images are attached to the encounter.  Dg Bone Density  Result Date: 03/01/2017 EXAM: DUAL X-RAY ABSORPTIOMETRY (DXA) FOR BONE MINERAL DENSITY  IMPRESSION: Dear Dr. Sindy Guadeloupe, Your patient Arena Lindahl completed a BMD test on 03/01/2017 using the Susan Moore (analysis version: 14.10) manufactured by [employer]. The following summarizes the results of our evaluation. PATIENT BIOGRAPHICAL: Name: Ahava, Kissoon Patient ID: [id number] Birth Date: [birth year] Height: 67.0 in. Gender: Female Exam Date: 03/01/2017 Weight: 209.0 lbs. Indications: Advanced Age, Breast CA, Caucasian, Height Loss, Postmenopausal Fractures: Treatments: Arimidex, Calcium, Vitamin D ASSESSMENT: The BMD measured at Femur Neck Right is 0.615 g/cm2 with a T-score of -3.0. This patient is considered osteoporotic according to [facility]) criteria. Lumbar spine was not utilized due to advanced degenerative changes. Site Region Measured Measured WHO Young Adult BMD Date       Age      Classification T-score DualFemur Neck Right 03/01/2017 74.9 Osteoporosis -3.0 0.615 g/cm2 Left Forearm Radius 33% 03/01/2017 74.9 Normal -0.7 0.814 g/cm2 World Health Organization [facility]) criteria for post-menopausal, Caucasian Women: Normal:       T-score at or above -1 SD Osteopenia:   T-score between -1 and -2.5 SD Osteoporosis: T-score at or below -2.5 SD RECOMMENDATIONS: [place] recommends that FDA-approved medical therapies be considered in postmenopausal women and men age 60 or older with a: 1. Hip or vertebral (clinical or morphometric) fracture. 2. T-score of < -2.5 at the spine or hip. 3. Ten-year fracture probability by FRAX of 3% or greater for hip fracture or 20% or greater for major osteoporotic fracture. All treatment decisions require clinical judgment and consideration of individual patient factors, including patient preferences, co-morbidities, previous drug use, risk factors not captured in the FRAX model (e.g. falls, vitamin D deficiency, increased bone turnover, interval significant decline in bone density) and possible under - or  over-estimation of fracture risk by FRAX. All patients should ensure an adequate intake of dietary calcium (1200 mg/d) and vitamin D (800 IU daily) unless contraindicated. FOLLOW-UP: People with diagnosed cases of osteoporosis or at high risk for fracture should have regular bone mineral density tests. For patients eligible for  Medicare, routine testing is allowed once every 2 years. The testing frequency can be increased to one year for patients who have rapidly progressing disease, those who are receiving or discontinuing medical therapy to restore bone mass, or have additional risk factors. I have reviewed this report, and agree with the above findings. [facility] Electronically Signed   By: Lajean Manes M.D.   On: 03/01/2017 13:29     Assessment and plan- Patient is a 76 y.o. female with a history of Stage II left breast cancer ER/PR positive HER-2/neu negative status post neoadjuvant chemotherapy followed by lumpectomy and radiation. She is currently on Arimidex   1. Breast cancer- discussed results of dexa scan which show osteoporosis with a T score of -3.0 in the right femur neck. This can potentially worsen with Arimidex and patient has at least 3 more years of treatment remaining if not 8.  She started hormone therapy in November 2016.  She will also continue calcium and vitamin D supplements and routine physical activity along with physical therapy.  I discussed benefit of starting bisphosphonates which has been associated with 5-10 yr risk reduction in fractures. Discussed oral versus parenteral bisphosphonates and risks and benefits including all but not limited to fatigue, hypocalcemia and risk of osteonecrosis of jaw. Discussed continuing AI and monitoring her bone density closely versus switching to tamoxifen. Patient would like to continue AI as she is interested in switching to cymbalta from lexapro for her depression which interacts with tamoxifen. Also interested in weekly  fosamax which we will prescribe. Bone density again next year   2. Chemotherapy-induced peripheral neuropathy- stable continue to monitor   Visit Diagnosis 1. Malignant neoplasm of overlapping sites of left female breast, unspecified estrogen receptor status ([place])   2. Osteoporosis without current pathological fracture, unspecified osteoporosis type      Dr. Randa Evens, MD, MPH [facility] Pager- [phone] 03/09/2017 12:18 PM

## 2017-03-14 ENCOUNTER — Encounter: Payer: Self-pay | Admitting: Radiation Oncology

## 2017-03-14 ENCOUNTER — Ambulatory Visit
Admission: RE | Admit: 2017-03-14 | Discharge: 2017-03-14 | Disposition: A | Discharge status: Home / Self Care | Payer: Medicare Other | Source: Ambulatory Visit | Attending: Radiation Oncology | Admitting: Radiation Oncology

## 2017-03-14 VITALS — BP 170/90 | HR 90 | Temp 98.7°F | Wt 220.9 lb

## 2017-03-14 DIAGNOSIS — C50412 Malignant neoplasm of upper-outer quadrant of left female breast: Secondary | ICD-10-CM | POA: Insufficient documentation

## 2017-03-14 DIAGNOSIS — Z923 Personal history of irradiation: Secondary | ICD-10-CM | POA: Insufficient documentation

## 2017-03-14 DIAGNOSIS — Z9012 Acquired absence of left breast and nipple: Secondary | ICD-10-CM | POA: Diagnosis not present

## 2017-03-14 DIAGNOSIS — M818 Other osteoporosis without current pathological fracture: Secondary | ICD-10-CM | POA: Diagnosis not present

## 2017-03-14 DIAGNOSIS — Z170 Estrogen receptor positive status [ER+]: Secondary | ICD-10-CM | POA: Diagnosis not present

## 2017-03-14 DIAGNOSIS — Z79811 Long term (current) use of aromatase inhibitors: Secondary | ICD-10-CM | POA: Diagnosis not present

## 2017-03-14 NOTE — Progress Notes (Signed)
Radiation Oncology Follow up Note  Name: Heidi [name]   Date:   03/14/2017 MRN:  [id number] DOB: Jun 17, 1941    This 76 y.o. female presents to the clinic today for 18 month follow-up status post whole breast radiation for stage IIa invasive mammary carcinoma ER/PR positive HER-2/neu negative.  REFERRING PROVIDER: Leia Alf, MD  HPI: Patient is a 75 year old female now out 18 months having completed whole breast and peripheral lymphatic radiation to her left breast for stage IIa ER/PR positive HER-2/neu negative invasive mammary carcinoma. Seen today in routine follow-up she is doing well. She specifically denies breast tenderness cough or bone pain. She's had a mastectomy on the right side. She's currently on Aromasin tolerating that well without side effect.. Left sided diagnostic mammogram back in June 2017 was benign BI-RADS 2. She's recently diagnosed on bone density study with osteoporosis and is receiving medication for that.  COMPLICATIONS OF TREATMENT: none  FOLLOW UP COMPLIANCE: keeps appointments   PHYSICAL EXAM:  BP (!) 170/90   Pulse 90   Temp 98.7 F (37.1 C)   Wt 220 lb 14.4 oz (100.2 kg)   BMI 34.60 kg/m  Patient is status post right modified radical mastectomy. No chest wall mass or nodularity is noted. Left breast is free of dominant mass or nodularity in 2 positions examined. Cosmetic result is poor secondary to surgical management. No axillary or supra clavicular adenopathy is identified. No evidence of lymphedema of either upper extremity is noted. Well-developed well-nourished patient in NAD. HEENT reveals PERLA, EOMI, discs not visualized.  Oral cavity is clear. No oral mucosal lesions are identified. Neck is clear without evidence of cervical or supraclavicular adenopathy. Lungs are clear to A&P. Cardiac examination is essentially unremarkable with regular rate and rhythm without murmur rub or thrill. Abdomen is benign with no organomegaly or masses noted. Motor  sensory and DTR levels are equal and symmetric in the upper and lower extremities. Cranial nerves II through XII are grossly intact. Proprioception is intact. No peripheral adenopathy or edema is identified. No motor or sensory levels are noted. Crude visual fields are within normal range.  RADIOLOGY RESULTS: Mammogram back in June is reviewed and compatible with the above-stated findings bone density studies also reviewed  PLAN: Present time patient continues to do well with no evidence of disease. I am please were overall progress. I've asked to see her back in 1 year for follow-up. She continues on Aromasin without side effect. She scheduled for repeat mammograms in June.  I would like to take this opportunity to thank you for allowing me to participate in the care of your patient.Armstead Peaks., MD

## 2017-03-21 ENCOUNTER — Encounter: Payer: Self-pay | Admitting: Surgery

## 2017-03-21 ENCOUNTER — Ambulatory Visit (INDEPENDENT_AMBULATORY_CARE_PROVIDER_SITE_OTHER): Payer: Medicare Other | Admitting: Surgery

## 2017-03-21 VITALS — BP 152/85 | HR 88 | Temp 99.2°F | Ht 67.0 in | Wt 216.8 lb

## 2017-03-21 DIAGNOSIS — Z4889 Encounter for other specified surgical aftercare: Secondary | ICD-10-CM

## 2017-03-21 NOTE — Progress Notes (Signed)
Surgical Clinic Progress/Follow-up Note   HPI:  76 y.o. Female presents to clinic for post-op follow-up evaluation 2 weeks s/p removal of Right IJ central venous catheter with subcutaneous port for breast carcinoma chemotherapy. Patient reports she's been happy to have the port removed and denies having had any pain, incisional redness/drainage/swelling, fever/chills, CP, or SOB.  Review of Systems:  Constitutional: denies any other weight loss, fever, chills, or sweats  Eyes: denies any other vision changes, history of eye injury  ENT: denies sore throat, hearing problems  Respiratory: denies shortness of breath, wheezing  Cardiovascular: denies chest pain, palpitations  Gastrointestinal: denies abdominal pain, N/V, or diarrhea Musculoskeletal: denies any other joint pains or cramps  Skin: Denies any other rashes or skin discolorations except as per HPI Neurological: denies any other headache, dizziness, weakness  Psychiatric: denies any other depression, anxiety  All other review of systems: otherwise negative   Vital Signs:  BP (!) 152/85   Pulse 88   Temp 99.2 F (37.3 C) (Oral)   Ht 5\' 7"  (1.702 m)   Wt 216 lb 12.8 oz (98.3 kg)   BMI 33.96 kg/m    Physical Exam:  Constitutional:  -- Normal body habitus  -- Awake, alert, and oriented x3  Eyes:  -- Pupils equally round and reactive to light  -- No scleral icterus  Ear, nose, throat:  -- No jugular venous distension  -- No nasal drainage, bleeding Pulmonary:  -- No crackles  -- No dullness to percussion  Cardiovascular:  -- S1, S2 present  -- No pericardial rubs  Gastrointestinal:  -- Soft, nontender, nondistended, no guarding/rebound  -- No abdominal masses appreciated, pulsatile or otherwise  Musculoskeletal / Integumentary:  -- Wounds or skin discoloration: 1.5 cm transverse Right upper chest incision well-approximated and NT with no surrounding erythema or drainage  -- Extremities: B/L UE and LE FROM, hands  and feet warm, no edema  Neurologic:  -- Motor function: intact and symmetric  -- Sensation: intact and symmetric   Assessment:  76 y.o. yo Female with a problem list including...  Patient Active Problem List   Diagnosis Date Noted  . Cancer of overlapping sites of left female breast ([place]) 06/09/2016  . Back pain 06/09/2016  . Acute bilateral thoracic back pain 06/09/2016  . Acute lumbar back pain 06/09/2016  . Insomnia 07/22/2015  . Osteoarthrosis involving lower leg 07/22/2015  . Preop cardiovascular exam 05/05/2015  . Abnormal stress test 03/01/2015  . Right subsegmental pulmonary embolus 02/23/2015  . COPD type A ([place]) 02/23/2015  . Breast cancer ([place]) 02/16/2015  . Smoking history 02/16/2015  . Essential hypertension 02/16/2015  . Obesity 02/16/2015  . History of chemotherapy 02/16/2015  . DOE (dyspnea on exertion) 01/21/2015  . S/P YAG capsulotomy 02/12/2013  . History of breast cancer 11/04/2012  . Recurrent major depressive disorder, in remission ([place]) 06/01/2011    presents to clinic for post-op follow-up evaluation, doing well 2 weeks s/p removal of chronic indwelling Right IJ central venous catheter with subcutaneous port.  Plan:   - okay to shower and submerge incision under water (bath/pool)   - return to clinic as needed, instructed to call office if any questions or concerns  All of the above recommendations were discussed with the patient, and all of patient's questions were answered to her expressed satisfaction.  -- Marilynne Drivers Rosana Hoes, MD, [place]: [facility] - Partnering for exceptional care. Office: [phone]

## 2017-03-21 NOTE — Patient Instructions (Signed)
Please call our office with any questions or concerns.  Please do not submerge in a tub, hot tub, or pool until incisions are completely sealed.  Use sun block to incision area over the next year if this area will be exposed to sun. This helps decrease scarring.  If you develop redness, drainage, or pain at incision sites- call our office immediately and speak with a nurse.  

## 2017-03-29 DIAGNOSIS — Z452 Encounter for adjustment and management of vascular access device: Secondary | ICD-10-CM

## 2017-04-02 ENCOUNTER — Other Ambulatory Visit: Payer: Self-pay

## 2017-04-06 ENCOUNTER — Other Ambulatory Visit: Payer: Self-pay | Admitting: *Deleted

## 2017-04-06 MED ORDER — DULOXETINE HCL 60 MG PO CPEP: 60.0000 mg | ORAL_CAPSULE | Freq: Every day | ORAL | 1 refills | Status: DC

## 2017-04-06 NOTE — Telephone Encounter (Signed)
Patient wanted MD to know that this is really helping her neuropathy a lot.  Patient advised new rx sent and that she will only need to take 1 capsule because it is a higher strength capsule. She verbalized understanding

## 2017-04-08 ENCOUNTER — Other Ambulatory Visit: Payer: Self-pay | Admitting: Oncology

## 2017-04-09 ENCOUNTER — Other Ambulatory Visit: Payer: Self-pay | Admitting: Oncology

## 2017-04-18 ENCOUNTER — Other Ambulatory Visit: Payer: Self-pay | Admitting: Oncology

## 2017-05-01 ENCOUNTER — Ambulatory Visit
Admission: RE | Admit: 2017-05-01 | Discharge: 2017-05-01 | Disposition: A | Discharge status: Home / Self Care | Payer: Medicare Other | Source: Ambulatory Visit | Attending: Oncology | Admitting: Oncology

## 2017-05-01 ENCOUNTER — Other Ambulatory Visit: Payer: Self-pay | Admitting: Oncology

## 2017-05-01 DIAGNOSIS — Z853 Personal history of malignant neoplasm of breast: Secondary | ICD-10-CM | POA: Insufficient documentation

## 2017-05-01 DIAGNOSIS — Z170 Estrogen receptor positive status [ER+]: Principal | ICD-10-CM

## 2017-05-01 DIAGNOSIS — Z9011 Acquired absence of right breast and nipple: Secondary | ICD-10-CM | POA: Diagnosis not present

## 2017-05-01 DIAGNOSIS — C50812 Malignant neoplasm of overlapping sites of left female breast: Secondary | ICD-10-CM

## 2017-07-02 ENCOUNTER — Other Ambulatory Visit: Payer: Self-pay | Admitting: Oncology

## 2017-08-03 ENCOUNTER — Other Ambulatory Visit: Payer: Self-pay | Admitting: Oncology

## 2017-08-08 ENCOUNTER — Telehealth: Payer: Self-pay | Admitting: Oncology

## 2017-08-08 ENCOUNTER — Inpatient Hospital Stay: Payer: Medicare Other | Attending: Oncology | Admitting: Oncology

## 2017-08-08 ENCOUNTER — Encounter: Payer: Self-pay | Admitting: Oncology

## 2017-08-08 VITALS — BP 148/84 | HR 95 | Temp 98.4°F | Resp 18 | Ht 65.0 in | Wt 215.0 lb

## 2017-08-08 DIAGNOSIS — E785 Hyperlipidemia, unspecified: Secondary | ICD-10-CM | POA: Insufficient documentation

## 2017-08-08 DIAGNOSIS — Z79811 Long term (current) use of aromatase inhibitors: Secondary | ICD-10-CM | POA: Insufficient documentation

## 2017-08-08 DIAGNOSIS — R51 Headache: Secondary | ICD-10-CM | POA: Diagnosis not present

## 2017-08-08 DIAGNOSIS — Z79899 Other long term (current) drug therapy: Secondary | ICD-10-CM | POA: Diagnosis not present

## 2017-08-08 DIAGNOSIS — Z170 Estrogen receptor positive status [ER+]: Secondary | ICD-10-CM | POA: Diagnosis not present

## 2017-08-08 DIAGNOSIS — T451X5S Adverse effect of antineoplastic and immunosuppressive drugs, sequela: Secondary | ICD-10-CM | POA: Diagnosis not present

## 2017-08-08 DIAGNOSIS — R0981 Nasal congestion: Secondary | ICD-10-CM | POA: Diagnosis not present

## 2017-08-08 DIAGNOSIS — Z9221 Personal history of antineoplastic chemotherapy: Secondary | ICD-10-CM | POA: Diagnosis not present

## 2017-08-08 DIAGNOSIS — Z853 Personal history of malignant neoplasm of breast: Secondary | ICD-10-CM | POA: Insufficient documentation

## 2017-08-08 DIAGNOSIS — J449 Chronic obstructive pulmonary disease, unspecified: Secondary | ICD-10-CM | POA: Diagnosis not present

## 2017-08-08 DIAGNOSIS — M810 Age-related osteoporosis without current pathological fracture: Secondary | ICD-10-CM

## 2017-08-08 DIAGNOSIS — M1990 Unspecified osteoarthritis, unspecified site: Secondary | ICD-10-CM | POA: Insufficient documentation

## 2017-08-08 DIAGNOSIS — Z7901 Long term (current) use of anticoagulants: Secondary | ICD-10-CM | POA: Insufficient documentation

## 2017-08-08 DIAGNOSIS — I10 Essential (primary) hypertension: Secondary | ICD-10-CM

## 2017-08-08 DIAGNOSIS — Z8052 Family history of malignant neoplasm of bladder: Secondary | ICD-10-CM | POA: Insufficient documentation

## 2017-08-08 DIAGNOSIS — Z86711 Personal history of pulmonary embolism: Secondary | ICD-10-CM | POA: Insufficient documentation

## 2017-08-08 DIAGNOSIS — C50412 Malignant neoplasm of upper-outer quadrant of left female breast: Secondary | ICD-10-CM | POA: Diagnosis not present

## 2017-08-08 DIAGNOSIS — G620 Drug-induced polyneuropathy: Secondary | ICD-10-CM | POA: Diagnosis not present

## 2017-08-08 DIAGNOSIS — Z9011 Acquired absence of right breast and nipple: Secondary | ICD-10-CM | POA: Insufficient documentation

## 2017-08-08 DIAGNOSIS — Z87891 Personal history of nicotine dependence: Secondary | ICD-10-CM | POA: Insufficient documentation

## 2017-08-08 DIAGNOSIS — Z923 Personal history of irradiation: Secondary | ICD-10-CM | POA: Insufficient documentation

## 2017-08-08 NOTE — Telephone Encounter (Signed)
Mammo and [place] appts scheduled per added order to 08/08/17 visit. Mailed patient a copy of the updated appointment schedule with mammo and U/S appointments.

## 2017-08-08 NOTE — Progress Notes (Signed)
Hematology/Oncology Consult note [facility]  [phone] [phone]  Patient Care Team: [name], MD as PCP - General (Family Medicine)   Name of the patient: Heidi [name]  [id number]  11/24/1940   Date of visit: 08/08/17  Diagnosis- stage II left breast cancer ER/PR positive and HER-2/neu negative  Chief complaint/ Reason for visit- routine f/u of breast cancer  Heme/Onc history:  Oncology History   # March 2016- LEFT BREAST CANCER STAGE II ER- 90%; PR-90%; Her 2 neu- NEG; NEO-ADJ CHEMO-  AC x4- Taxol x12 w;;s/p LUMPEC & SLNBx [ypT1 [1.5cm]; ypsN 3/6 (2 macro & 1 micro met)]  s/p RT [Nov 1st week;finish] NOV 2016- START FEMARA stop Feb 2017; April 2017- start Arimidex  # Right Breast cancer ? Stage;  s/p Mastec [1990;s/p Chemo; RT ]  # PN G-2 on neurontin; Hx PE on eliquis  # Mediport     Cancer of overlapping sites of left female breast ([place])   06/09/2016 Initial Diagnosis    Cancer of overlapping sites of left female breast ([place])        Interval history- she is tolerating arimidex and foasamax well. Reports no significant side effects. She is also taking her calcium and vit D. Reports some headaches, bachaches and bodyaches and nasal congestion for past few days. No fever. Appetite is good. Denies other complaints  ECOG PS- 1 Pain scale- 0   Review of systems- Review of Systems  Constitutional: Negative for chills, fever, malaise/fatigue and weight loss.       Bodyaches  HENT: Negative for congestion, ear discharge and nosebleeds.   Eyes: Negative for blurred vision.  Respiratory: Negative for cough, hemoptysis, sputum production, shortness of breath and wheezing.   Cardiovascular: Negative for chest pain, palpitations, orthopnea and claudication.  Gastrointestinal: Negative for abdominal pain, blood in stool, constipation, diarrhea, heartburn, melena, nausea and vomiting.  Genitourinary: Negative for dysuria,  flank pain, frequency, hematuria and urgency.  Musculoskeletal: Positive for back pain. Negative for joint pain and myalgias.  Skin: Negative for rash.  Neurological: Positive for headaches. Negative for dizziness, tingling, focal weakness, seizures and weakness.  Endo/Heme/Allergies: Does not bruise/bleed easily.  Psychiatric/Behavioral: Negative for depression and suicidal ideas. The patient does not have insomnia.       Allergies  Allergen Reactions  . Diphenhydramine Other (See Comments)    pt states makes legs very restless and ache  . Hydrochloric Acid Hives    Other reaction(s): Blisters Uncoded Allergy. Allergen: HYDROCHLORLIC ACID.     Past Medical History:  Diagnosis Date  . Anemia   . Anxiety   . Breast cancer ([place]) 1990   RT MASTECTOMY  . Breast cancer ([place]) 2015   LT LUMPECTOMY  . C. difficile diarrhea    see dr Vira Agar and this is her second course of vancomycin  . Cataracts, bilateral   . COPD (chronic obstructive pulmonary disease) ([facility])    mild COPD  . Depression   . Dyspnea    with exersion  . Hepatitis 1960's   does not know which type  . Hyperlipidemia   . Hypertension   . Insomnia   . Neuropathy due to chemotherapeutic drug ([place])   . Osteoarthritis   . Osteoporosis   . UTI (lower urinary tract infection)      Past Surgical History:  Procedure Laterality Date  . APPENDECTOMY  1970's  . BREAST BIOPSY Left 05/07/2015   Procedure: BREAST BIOPSY WITH NEEDLE LOCALIZATION;  Surgeon: Deidre Ala  Malon Kindle, MD;  Location: [facility] ORS;  Service: General;  Laterality: Left;  . BREAST LUMPECTOMY WITH SENTINEL LYMPH NODE BIOPSY Left 05/07/2015   Procedure: PARTIAL MASTECTOMY WITH SENTINEL LYMPH NODE BX;  Surgeon: Dia Crawford III, MD;  Location: [facility] ORS;  Service: General;  Laterality: Left;  . BREAST SURGERY Right 1990   mastectomy  . EYE SURGERY Bilateral    cataract extractions  . MASTECTOMY Right    1990's  . PORT-A-CATH REMOVAL Right 03/07/2017   Procedure:  REMOVAL PORT-A-CATH;  Surgeon: Vickie Epley, MD;  Location: [facility] ORS;  Service: General;  Laterality: Right;    Social History   Social History  . Marital status: Divorced    Spouse name: N/A  . Number of children: N/A  . Years of education: N/A   Occupational History  . Not on file.   Social History Main Topics  . Smoking status: Former Smoker    Packs/day: 2.00    Years: 25.00    Types: Cigarettes    Quit date: 04/28/1992  . Smokeless tobacco: Never Used     Comment: Pt quit 1995  . Alcohol use No  . Drug use: No  . Sexual activity: No   Other Topics Concern  . Not on file   Social History Narrative  . No narrative on file    Family History  Problem Relation Age of Onset  . Dementia Mother   . Bladder Cancer Father   . Diabetes Sister   . Breast cancer Neg Hx      Current Outpatient Prescriptions:  .  acetaminophen (TYLENOL) 500 MG tablet, Take 500 mg by mouth 3 (three) times daily as needed for pain., Disp: , Rfl:  .  alendronate (FOSAMAX) 70 MG tablet, Take 1 tablet (70 mg total) by mouth once a week. Take with a full glass of water on an empty stomach., Disp: 4 tablet, Rfl: 5 .  anastrozole (ARIMIDEX) 1 MG tablet, Take 1 tablet (1 mg total) by mouth daily., Disp: 90 tablet, Rfl: 3 .  BucAlfAspKGlucCouchParsUvaUrJu (WATER PILLS PO), Take 1-2 tablets by mouth daily as needed (for fluid retention)., Disp: , Rfl:  .  calcium citrate (CALCITRATE - DOSED IN MG ELEMENTAL CALCIUM) 950 MG tablet, Take 200 mg of elemental calcium by mouth daily., Disp: , Rfl:  .  Cholecalciferol (VITAMIN D3) 5000 units CAPS, Take 5,000 Units by mouth every evening. , Disp: , Rfl:  .  Coenzyme Q10-Red Yeast Rice 60-600 MG CAPS, Take 2 capsules by mouth every evening., Disp: , Rfl:  .  DULoxetine (CYMBALTA) 60 MG capsule, Take 1 capsule (60 mg total) by mouth daily. Please note change in strength, Disp: 90 capsule, Rfl: 1 .  gabapentin (NEURONTIN) 300 MG capsule, TAKE TWO CAPSULES BY  MOUTH TWICE A DAY, Disp: 120 capsule, Rfl: 0 .  losartan (COZAAR) 50 MG tablet, Take 50 mg by mouth daily., Disp: , Rfl:  .  Multiple Vitamin (MULTIVITAMIN WITH MINERALS) TABS tablet, Take 1 tablet by mouth at bedtime. Senior Multivitamin, Disp: , Rfl:  .  zolpidem (AMBIEN) 10 MG tablet, Take 1 tablet (10 mg total) by mouth at bedtime., Disp: 30 tablet, Rfl: 1 No current facility-administered medications for this visit.   Facility-Administered Medications Ordered in Other Visits:  .  sodium chloride 0.9 % injection 10 mL, 10 mL, Intravenous, PRN, Ma Hillock, Sandeep, MD .  sodium chloride 0.9 % injection 10 mL, 10 mL, Intravenous, PRN, Leia Alf, MD, 10 mL at 06/23/15 1236 .  sodium chloride flush (NS) 0.9 % injection 10 mL, 10 mL, Intravenous, PRN, Cammie Sickle, MD, 10 mL at 01/28/16 1421  Physical exam:  Vitals:   08/08/17 1511 08/08/17 1547  BP:  (!) 148/84  Pulse:  95  Resp:  18  Temp:  98.4 F (36.9 C)  TempSrc:  Tympanic  Weight: 215 lb (97.5 kg)   Height: 5' 5" (1.651 m)    Physical Exam  Constitutional: She is oriented to person, place, and time and well-developed, well-nourished, and in no distress.  HENT:  Head: Normocephalic and atraumatic.  Eyes: Pupils are equal, round, and reactive to light. EOM are normal.  Neck: Normal range of motion.  Cardiovascular: Normal rate, regular rhythm and normal heart sounds.   Pulmonary/Chest: Effort normal and breath sounds normal.  Abdominal: Soft. Bowel sounds are normal.  Neurological: She is alert and oriented to person, place, and time.  Skin: Skin is warm and dry.     CMP Latest Ref Rng & Units 09/01/2015  Glucose 65 - 99 mg/dL 104(H)  BUN 6 - 20 mg/dL 17  Creatinine 0.44 - 1.00 mg/dL 0.63  Sodium 135 - 145 mmol/L 135  Potassium 3.5 - 5.1 mmol/L 4.0  Chloride 101 - 111 mmol/L 103  CO2 22 - 32 mmol/L 26  Calcium 8.9 - 10.3 mg/dL 8.3(L)  Total Protein 6.5 - 8.1 g/dL 7.1  Total Bilirubin 0.3 - 1.2 mg/dL 0.5    Alkaline Phos 38 - 126 U/L 79  AST 15 - 41 U/L 15  ALT 14 - 54 U/L 13(L)   CBC Latest Ref Rng & Units 09/01/2015  WBC 3.6 - 11.0 K/uL 6.4  Hemoglobin 12.0 - 16.0 g/dL 11.7(L)  Hematocrit 35.0 - 47.0 % 35.0  Platelets 150 - 440 K/uL 229      Assessment and plan- Patient is a 76 y.o. female with a history of Stage II left breast cancer ER/PR positive HER-2/neu negative status post neoadjuvant chemotherapy followed by lumpectomy and radiation. She is currently on Arimidex  1. Breast cancer- Mammogram from July 2018 showed no evidence of malignancy in the left breast. Repeat mammogram next year. Status post right mastectomy  2. Osteoporosis- continue fosamax. Will get bone denisty in 2020. May 2018 scan showed osteoporosis with a T score of -3.   3. Chemo induced peripheral neuropathy- improved after starting cymbalta  RTC in 6 months   Visit Diagnosis 1. Malignant neoplasm of upper-outer quadrant of left breast in female, estrogen receptor positive ([place])      Dr. Randa Evens, MD, MPH [facility] at [facility] Pager- [phone] 08/08/2017 8:08 AM

## 2017-08-08 NOTE — Progress Notes (Signed)
Patient here for follow up no changes since her last appointment. 

## 2017-08-09 ENCOUNTER — Ambulatory Visit: Payer: Self-pay | Admitting: Oncology

## 2017-08-28 ENCOUNTER — Other Ambulatory Visit: Payer: Self-pay | Admitting: Oncology

## 2017-08-30 ENCOUNTER — Other Ambulatory Visit: Payer: Self-pay | Admitting: Oncology

## 2017-09-25 ENCOUNTER — Other Ambulatory Visit: Payer: Self-pay | Admitting: Oncology

## 2017-09-30 ENCOUNTER — Other Ambulatory Visit: Payer: Self-pay | Admitting: Oncology

## 2017-10-03 ENCOUNTER — Other Ambulatory Visit: Payer: Self-pay | Admitting: *Deleted

## 2017-10-03 NOTE — Telephone Encounter (Signed)
Duplicate request

## 2017-12-04 DIAGNOSIS — F411 Generalized anxiety disorder: Secondary | ICD-10-CM | POA: Insufficient documentation

## 2017-12-05 ENCOUNTER — Other Ambulatory Visit: Payer: Self-pay | Admitting: Oncology

## 2017-12-16 ENCOUNTER — Other Ambulatory Visit: Payer: Self-pay | Admitting: Oncology

## 2017-12-19 ENCOUNTER — Telehealth: Payer: Self-pay | Admitting: *Deleted

## 2017-12-19 NOTE — Telephone Encounter (Signed)
Called patient and asked her about the jaw pain. She states it has been hurting for a few days and she does not have pain inside her mouth like her teeth. When she opens her mouth her jaw  Hurts and sends pain to ear.  She was thinking it may be from the fosamax.  Dr. Melissa Noon we could see her if it does not get better.  Patient would like to come next week on tues and wed. In afternoon. I offered her 2/26 at 3 pm. Pt agreeable to this

## 2017-12-19 NOTE — Telephone Encounter (Signed)
-----   Message from Wilburn Cornelia sent at 12/19/2017  1:10 PM EST ----- Regarding: jaw pain Contact: [phone] Pt left VM about 3 episodes of jaw pain-asking if it is coming from ant meds?

## 2017-12-25 ENCOUNTER — Inpatient Hospital Stay: Payer: Medicare Other | Attending: Oncology | Admitting: Oncology

## 2017-12-25 VITALS — BP 148/78 | HR 91 | Temp 97.8°F | Resp 18 | Ht 65.0 in | Wt 237.1 lb

## 2017-12-25 DIAGNOSIS — Z79811 Long term (current) use of aromatase inhibitors: Secondary | ICD-10-CM | POA: Diagnosis not present

## 2017-12-25 DIAGNOSIS — E785 Hyperlipidemia, unspecified: Secondary | ICD-10-CM | POA: Diagnosis not present

## 2017-12-25 DIAGNOSIS — Z9221 Personal history of antineoplastic chemotherapy: Secondary | ICD-10-CM | POA: Insufficient documentation

## 2017-12-25 DIAGNOSIS — Z8052 Family history of malignant neoplasm of bladder: Secondary | ICD-10-CM | POA: Insufficient documentation

## 2017-12-25 DIAGNOSIS — J449 Chronic obstructive pulmonary disease, unspecified: Secondary | ICD-10-CM | POA: Insufficient documentation

## 2017-12-25 DIAGNOSIS — C50812 Malignant neoplasm of overlapping sites of left female breast: Secondary | ICD-10-CM | POA: Insufficient documentation

## 2017-12-25 DIAGNOSIS — Z923 Personal history of irradiation: Secondary | ICD-10-CM | POA: Diagnosis not present

## 2017-12-25 DIAGNOSIS — M1990 Unspecified osteoarthritis, unspecified site: Secondary | ICD-10-CM | POA: Diagnosis not present

## 2017-12-25 DIAGNOSIS — Z87891 Personal history of nicotine dependence: Secondary | ICD-10-CM | POA: Diagnosis not present

## 2017-12-25 DIAGNOSIS — M810 Age-related osteoporosis without current pathological fracture: Secondary | ICD-10-CM | POA: Insufficient documentation

## 2017-12-25 DIAGNOSIS — Z86711 Personal history of pulmonary embolism: Secondary | ICD-10-CM | POA: Diagnosis not present

## 2017-12-25 DIAGNOSIS — R6884 Jaw pain: Secondary | ICD-10-CM | POA: Insufficient documentation

## 2017-12-25 DIAGNOSIS — Z853 Personal history of malignant neoplasm of breast: Secondary | ICD-10-CM | POA: Diagnosis not present

## 2017-12-25 DIAGNOSIS — Z79899 Other long term (current) drug therapy: Secondary | ICD-10-CM | POA: Diagnosis not present

## 2017-12-25 DIAGNOSIS — Z170 Estrogen receptor positive status [ER+]: Secondary | ICD-10-CM | POA: Insufficient documentation

## 2017-12-25 DIAGNOSIS — I10 Essential (primary) hypertension: Secondary | ICD-10-CM | POA: Insufficient documentation

## 2017-12-25 DIAGNOSIS — Z9011 Acquired absence of right breast and nipple: Secondary | ICD-10-CM | POA: Diagnosis not present

## 2017-12-25 DIAGNOSIS — Z7901 Long term (current) use of anticoagulants: Secondary | ICD-10-CM | POA: Insufficient documentation

## 2017-12-25 NOTE — Progress Notes (Signed)
Several pain in bilateral jaw. Patient states It happen every couple of weeks and think it could be the fosamax. But no pain or new issue noted today

## 2017-12-27 ENCOUNTER — Encounter: Payer: Self-pay | Admitting: Oncology

## 2017-12-27 NOTE — Progress Notes (Signed)
Hematology/Oncology Consult note [facility]  [phone] [phone]  Patient Care Team: [name], MD as PCP - General (Family Medicine)   Name of the patient: Heidi [name]  [id number]  [birth year]   Date of visit: 12/27/17  Diagnosis- stage II left breast cancer ER/PR positive and HER-2/neu negative   Chief complaint/ Reason for visit- acute visit for b/l jaw pain  Heme/Onc history:  Oncology History   # March 2016- LEFT BREAST CANCER STAGE II ER- 90%; PR-90%; Her 2 neu- NEG; NEO-ADJ CHEMO-  AC x4- Taxol x12 w;;s/p LUMPEC & SLNBx [ypT1 [1.5cm]; ypsN 3/6 (2 macro & 1 micro met)]  s/p RT [Nov 1st week;finish] NOV 2016- START FEMARA stop Feb 2017; April 2017- start Arimidex  # Right Breast cancer ? Stage;  s/p Mastec [1990;s/p Chemo; RT ]  # PN G-2 on neurontin; Hx PE on eliquis  # Mediport     Cancer of overlapping sites of left female breast ([place])   06/09/2016 Initial Diagnosis    Cancer of overlapping sites of left female breast ([place])      Started fosaamax for osteoporosis in May 2018  Interval history- reports having intermittent jaw pain over the last 2-3 months. This is sporadic and comes suddenly when she opens her mouth to eat food and is intensely painful. It lasts for a few minutes and then subsides on its own. No persistent pain  ECOG PS- 1 Pain scale- 0 Opioid associated constipation- no  Review of systems- Review of Systems  Constitutional: Negative for chills, fever, malaise/fatigue and weight loss.  HENT: Negative for congestion, ear discharge and nosebleeds.        Bilateral jaw pain  Eyes: Negative for blurred vision.  Respiratory: Negative for cough, hemoptysis, sputum production, shortness of breath and wheezing.   Cardiovascular: Negative for chest pain, palpitations, orthopnea and claudication.  Gastrointestinal: Negative for abdominal pain, blood in stool, constipation, diarrhea, heartburn,  melena, nausea and vomiting.  Genitourinary: Negative for dysuria, flank pain, frequency, hematuria and urgency.  Musculoskeletal: Negative for back pain, joint pain and myalgias.  Skin: Negative for rash.  Neurological: Negative for dizziness, tingling, focal weakness, seizures, weakness and headaches.  Endo/Heme/Allergies: Does not bruise/bleed easily.  Psychiatric/Behavioral: Negative for depression and suicidal ideas. The patient does not have insomnia.       Allergies  Allergen Reactions  . Diphenhydramine Other (See Comments)    pt states makes legs very restless and ache  . Hydrochloric Acid Hives    Other reaction(s): Blisters Uncoded Allergy. Allergen: HYDROCHLORLIC ACID.     Past Medical History:  Diagnosis Date  . Anemia   . Anxiety   . Breast cancer ([place]) 1990   RT MASTECTOMY  . Breast cancer ([place]) 2015   LT LUMPECTOMY  . C. difficile diarrhea    see dr Vira Agar and this is her second course of vancomycin  . Cataracts, bilateral   . COPD (chronic obstructive pulmonary disease) ([facility])    mild COPD  . Depression   . Dyspnea    with exersion  . Hepatitis 1960's   does not know which type  . Hyperlipidemia   . Hypertension   . Insomnia   . Neuropathy due to chemotherapeutic drug ([place])   . Osteoarthritis   . Osteoporosis   . UTI (lower urinary tract infection)      Past Surgical History:  Procedure Laterality Date  . APPENDECTOMY  1970's  . BREAST BIOPSY Left 05/07/2015  Procedure: BREAST BIOPSY WITH NEEDLE LOCALIZATION;  Surgeon: Dia Crawford III, MD;  Location: [facility] ORS;  Service: General;  Laterality: Left;  . BREAST LUMPECTOMY WITH SENTINEL LYMPH NODE BIOPSY Left 05/07/2015   Procedure: PARTIAL MASTECTOMY WITH SENTINEL LYMPH NODE BX;  Surgeon: Dia Crawford III, MD;  Location: [facility] ORS;  Service: General;  Laterality: Left;  . BREAST SURGERY Right 1990   mastectomy  . EYE SURGERY Bilateral    cataract extractions  . MASTECTOMY Right    1990's  . PORT-A-CATH  REMOVAL Right 03/07/2017   Procedure: REMOVAL PORT-A-CATH;  Surgeon: Vickie Epley, MD;  Location: [facility] ORS;  Service: General;  Laterality: Right;    Social History   Socioeconomic History  . Marital status: Divorced    Spouse name: Not on file  . Number of children: Not on file  . Years of education: Not on file  . Highest education level: Not on file  Social Needs  . Financial resource strain: Not on file  . Food insecurity - worry: Not on file  . Food insecurity - inability: Not on file  . Transportation needs - medical: Not on file  . Transportation needs - non-medical: Not on file  Occupational History  . Not on file  Tobacco Use  . Smoking status: Former Smoker    Packs/day: 2.00    Years: 25.00    Pack years: 50.00    Types: Cigarettes    Last attempt to quit: 04/28/1992    Years since quitting: 25.6  . Smokeless tobacco: Never Used  . Tobacco comment: Pt quit 1995  Substance and Sexual Activity  . Alcohol use: No    Alcohol/week: 0.0 oz  . Drug use: No  . Sexual activity: No  Other Topics Concern  . Not on file  Social History Narrative  . Not on file    Family History  Problem Relation Age of Onset  . Dementia Mother   . Bladder Cancer Father   . Diabetes Sister   . Breast cancer Neg Hx      Current Outpatient Medications:  .  alendronate (FOSAMAX) 70 MG tablet, TAKE ONE TABLET BY MOUTH WEEKLY (7 DAYS) WITH A FULL GLASS OF WATER AND ON AN EMPTY STOMACH, Disp: 4 tablet, Rfl: 2 .  anastrozole (ARIMIDEX) 1 MG tablet, Take 1 tablet (1 mg total) by mouth daily., Disp: 90 tablet, Rfl: 3 .  BucAlfAspKGlucCouchParsUvaUrJu (WATER PILLS PO), Take 1-2 tablets by mouth daily as needed (for fluid retention)., Disp: , Rfl:  .  BuPROPion HCl (WELLBUTRIN PO), Take 1 tablet by mouth 2 (two) times daily., Disp: , Rfl:  .  calcium citrate (CALCITRATE - DOSED IN MG ELEMENTAL CALCIUM) 950 MG tablet, Take 200 mg of elemental calcium by mouth daily., Disp: , Rfl:  .   Cholecalciferol (VITAMIN D3) 5000 units CAPS, Take 5,000 Units by mouth every evening. , Disp: , Rfl:  .  Coenzyme Q10-Red Yeast Rice 60-600 MG CAPS, Take 2 capsules by mouth every evening., Disp: , Rfl:  .  gabapentin (NEURONTIN) 300 MG capsule, TAKE TWO CAPSULES BY MOUTH TWICE A DAY, Disp: 120 capsule, Rfl: 0 .  losartan (COZAAR) 50 MG tablet, Take 50 mg by mouth daily., Disp: , Rfl:  .  Multiple Vitamin (MULTIVITAMIN WITH MINERALS) TABS tablet, Take 1 tablet by mouth at bedtime. Senior Multivitamin, Disp: , Rfl:  .  acetaminophen (TYLENOL) 500 MG tablet, Take 500 mg by mouth 3 (three) times daily as needed for pain., Disp: ,  Rfl:  .  DULoxetine (CYMBALTA) 60 MG capsule, TAKE ONE CAPSULE BY MOUTH DAILY. PLEASE NOTE CHANGE IN STRENGTH (Patient not taking: Reported on 12/25/2017), Disp: 90 capsule, Rfl: 0 .  zolpidem (AMBIEN) 10 MG tablet, Take 1 tablet (10 mg total) by mouth at bedtime. (Patient not taking: Reported on 12/25/2017), Disp: 30 tablet, Rfl: 1 No current facility-administered medications for this visit.   Facility-Administered Medications Ordered in Other Visits:  .  sodium chloride 0.9 % injection 10 mL, 10 mL, Intravenous, PRN, Ma Hillock, Sandeep, MD .  sodium chloride 0.9 % injection 10 mL, 10 mL, Intravenous, PRN, Leia Alf, MD, 10 mL at 06/23/15 1236 .  sodium chloride flush (NS) 0.9 % injection 10 mL, 10 mL, Intravenous, PRN, Cammie Sickle, MD, 10 mL at 01/28/16 1421  Physical exam:  Vitals:   12/25/17 1454  BP: (!) 148/78  Pulse: 91  Resp: 18  Temp: 97.8 F (36.6 C)  TempSrc: Oral  SpO2: 99%  Weight: 237 lb 1.6 oz (107.5 kg)  Height: '5\' 5"'  (1.651 m)   Physical Exam  Constitutional: She is oriented to person, place, and time and well-developed, well-nourished, and in no distress.  HENT:  Head: Normocephalic and atraumatic.  Few missing teeth and dental caries. No open exposed jaw bone areas. No mucosal ulcerations. No pain on opening or closing the jaw and  no restriction in jaw movements  Eyes: EOM are normal. Pupils are equal, round, and reactive to light.  Neck: Normal range of motion.  Cardiovascular: Normal rate, regular rhythm and normal heart sounds.  Pulmonary/Chest: Effort normal and breath sounds normal.  Abdominal: Soft. Bowel sounds are normal.  Neurological: She is alert and oriented to person, place, and time.  Skin: Skin is warm and dry.     CMP Latest Ref Rng & Units 09/01/2015  Glucose 65 - 99 mg/dL 104(H)  BUN 6 - 20 mg/dL 17  Creatinine 0.44 - 1.00 mg/dL 0.63  Sodium 135 - 145 mmol/L 135  Potassium 3.5 - 5.1 mmol/L 4.0  Chloride 101 - 111 mmol/L 103  CO2 22 - 32 mmol/L 26  Calcium 8.9 - 10.3 mg/dL 8.3(L)  Total Protein 6.5 - 8.1 g/dL 7.1  Total Bilirubin 0.3 - 1.2 mg/dL 0.5  Alkaline Phos 38 - 126 U/L 79  AST 15 - 41 U/L 15  ALT 14 - 54 U/L 13(L)   CBC Latest Ref Rng & Units 09/01/2015  WBC 3.6 - 11.0 K/uL 6.4  Hemoglobin 12.0 - 16.0 g/dL 11.7(L)  Hematocrit 35.0 - 47.0 % 35.0  Platelets 150 - 440 K/uL 229     Assessment and plan- Patient is a 77 y.o. female with a history of Stage II left breast cancer ER/PR positive HER-2/neu negative status post neoadjuvant chemotherapy followed by lumpectomy and radiation. She is currently on Arimidex  Jaw pain- etiology unclear- I do not see any exposed areas of jaw bone that would be suggestive of ONJ. I will obtain plain radiographs of her jaw. I have asked her to hold off on her fosamax at this time. We will call her back in 1 month to see how she is doing. If Xrays are negative and symptoms persistent- she either needs dental/ent evaluation or CT maxillofacial. I do not feel that her symptoms are related to ONJ and the likelihood of fosamax causing it is very low  breast cancer- continue arimidex along with calcium and vit D supplements. I will see her back in 3 months   Visit  Diagnosis 1. Jaw pain      Dr. Randa Evens, MD, MPH [facility] Pager- [phone] 12/27/2017 8:06 AM

## 2017-12-28 ENCOUNTER — Ambulatory Visit
Admission: RE | Admit: 2017-12-28 | Discharge: 2017-12-28 | Disposition: A | Discharge status: Home / Self Care | Payer: Medicare Other | Source: Ambulatory Visit | Attending: Oncology | Admitting: Oncology

## 2017-12-28 DIAGNOSIS — R6884 Jaw pain: Secondary | ICD-10-CM | POA: Diagnosis present

## 2017-12-31 ENCOUNTER — Other Ambulatory Visit: Payer: Self-pay | Admitting: Urgent Care

## 2018-01-21 ENCOUNTER — Telehealth: Payer: Self-pay

## 2018-01-21 NOTE — Telephone Encounter (Deleted)
Heidi [name] was contacted due to Jaw pain noted at last visit/ Patient c/o jaw pain while on fosamax/ 1 month without fosamax checking patient pain level without Fosamax. The patient states she has been feeling a lot better without the fosamax .

## 2018-01-21 NOTE — Telephone Encounter (Signed)
Heidi [name] was contacted due to Jaw pain noted at last visit/ Patient c/o jaw pain while on fosamax/ 1 month without fosamax checking patient pain level without Fosamax. The patient didn't answer and message was left for the patient to return the call as soon as possible and let us know how she is doing for the jaw pain.

## 2018-01-21 NOTE — Telephone Encounter (Signed)
Patient called back and said that her jaw pain completely resolved and she will now start back on it 3/29 this Friday.  Patient will give [place] a call if starts having the pain again after restarting the fosamax( alendronate).

## 2018-01-28 ENCOUNTER — Other Ambulatory Visit: Payer: Self-pay | Admitting: Oncology

## 2018-02-07 ENCOUNTER — Ambulatory Visit: Payer: Self-pay | Admitting: Oncology

## 2018-03-05 ENCOUNTER — Other Ambulatory Visit: Payer: Self-pay | Admitting: Oncology

## 2018-03-26 ENCOUNTER — Ambulatory Visit
Admission: RE | Admit: 2018-03-26 | Discharge: 2018-03-26 | Disposition: A | Discharge status: Home / Self Care | Payer: Medicare Other | Source: Ambulatory Visit | Attending: Radiation Oncology | Admitting: Radiation Oncology

## 2018-03-26 ENCOUNTER — Encounter: Payer: Self-pay | Admitting: Radiation Oncology

## 2018-03-26 ENCOUNTER — Other Ambulatory Visit: Payer: Self-pay

## 2018-03-26 VITALS — BP 164/82 | HR 82 | Temp 97.9°F | Resp 20 | Wt 230.1 lb

## 2018-03-26 DIAGNOSIS — Z170 Estrogen receptor positive status [ER+]: Secondary | ICD-10-CM | POA: Insufficient documentation

## 2018-03-26 DIAGNOSIS — Z923 Personal history of irradiation: Secondary | ICD-10-CM | POA: Insufficient documentation

## 2018-03-26 DIAGNOSIS — Z9011 Acquired absence of right breast and nipple: Secondary | ICD-10-CM | POA: Diagnosis not present

## 2018-03-26 DIAGNOSIS — C50412 Malignant neoplasm of upper-outer quadrant of left female breast: Secondary | ICD-10-CM | POA: Insufficient documentation

## 2018-03-26 DIAGNOSIS — Z79811 Long term (current) use of aromatase inhibitors: Secondary | ICD-10-CM | POA: Insufficient documentation

## 2018-03-26 NOTE — Progress Notes (Signed)
Radiation Oncology Follow up Note  Name: Heidi [name]   Date:   03/26/2018 MRN:  [id number] DOB: 01/27/1941    This 76 y.o. female presents to the clinic today for 3 year follow-up status post whole breast radiation to her left breast for stage IIa ER/PR positive invasive mammary carcinoma.  REFERRING PROVIDER: Linthavong, Kanhka, MD  HPI: patient is a 76-year-old female now seen out close to 3 years having completed whole breast radiation to her left breast for stage IIa ER/PR positive HER-2/neu negative invasive mammary carcinoma. She is status post mastectomy on the right side. She seen today in routine follow-up and is doing well. She is currently on.arimadex tolerate that well without side effect.her last mammogram of her left breast was back in July was BI-RADS 2 benign  COMPLICATIONS OF TREATMENT: none  FOLLOW UP COMPLIANCE: keeps appointments   PHYSICAL EXAM:  BP (!) 164/82   Pulse 82   Temp 97.9 F (36.6 C)   Resp 20   Wt 230 lb 0.8 oz (104.3 kg)   BMI 38.28 kg/m  Patient is status post right modified radical mastectomy chest walls clear without evidence of mass or nodularity. Left breast shows no obvious mass or nodularity in 2 positions examined. Lungs are clear no axillary or supraclavicular adenopathy is appreciated. Well-developed well-nourished patient in NAD. HEENT reveals PERLA, EOMI, discs not visualized.  Oral cavity is clear. No oral mucosal lesions are identified. Neck is clear without evidence of cervical or supraclavicular adenopathy. Lungs are clear to A&P. Cardiac examination is essentially unremarkable with regular rate and rhythm without murmur rub or thrill. Abdomen is benign with no organomegaly or masses noted. Motor sensory and DTR levels are equal and symmetric in the upper and lower extremities. Cranial nerves II through XII are grossly intact. Proprioception is intact. No peripheral adenopathy or edema is identified. No motor or sensory levels are noted.  Crude visual fields are within normal range.  RADIOLOGY RESULTS: mammograms have been ordered for July  PLAN: present time patient is doing well with no evidence of disease. Based on her problems with transportation I would opt to discontinue follow-up care another or out 3 years. She continues close follow-up care with medical oncology. I would be happy to reevaluate the patient any time should further radiation therapy be indicated.  I would like to take this opportunity to thank you for allowing me to participate in the care of your patient..    Glenn Chrystal, MD   

## 2018-03-29 ENCOUNTER — Other Ambulatory Visit: Payer: Self-pay | Admitting: *Deleted

## 2018-03-29 DIAGNOSIS — C50812 Malignant neoplasm of overlapping sites of left female breast: Secondary | ICD-10-CM

## 2018-03-29 DIAGNOSIS — Z170 Estrogen receptor positive status [ER+]: Principal | ICD-10-CM

## 2018-03-29 MED ORDER — ANASTROZOLE 1 MG PO TABS: 1.0000 mg | ORAL_TABLET | Freq: Every day | ORAL | 0 refills | Status: DC

## 2018-04-05 ENCOUNTER — Other Ambulatory Visit: Payer: Self-pay | Admitting: Oncology

## 2018-04-09 ENCOUNTER — Encounter: Payer: Self-pay | Admitting: Oncology

## 2018-04-09 ENCOUNTER — Other Ambulatory Visit: Payer: Self-pay | Admitting: Oncology

## 2018-04-09 ENCOUNTER — Inpatient Hospital Stay: Payer: Medicare Other | Attending: Oncology | Admitting: Oncology

## 2018-04-09 VITALS — BP 125/79 | HR 96 | Temp 98.1°F | Resp 18 | Ht 65.0 in | Wt 225.5 lb

## 2018-04-09 DIAGNOSIS — G8929 Other chronic pain: Secondary | ICD-10-CM

## 2018-04-09 DIAGNOSIS — Z86711 Personal history of pulmonary embolism: Secondary | ICD-10-CM | POA: Insufficient documentation

## 2018-04-09 DIAGNOSIS — Z87891 Personal history of nicotine dependence: Secondary | ICD-10-CM | POA: Insufficient documentation

## 2018-04-09 DIAGNOSIS — Z9221 Personal history of antineoplastic chemotherapy: Secondary | ICD-10-CM | POA: Diagnosis not present

## 2018-04-09 DIAGNOSIS — Z923 Personal history of irradiation: Secondary | ICD-10-CM | POA: Diagnosis not present

## 2018-04-09 DIAGNOSIS — Z170 Estrogen receptor positive status [ER+]: Secondary | ICD-10-CM | POA: Insufficient documentation

## 2018-04-09 DIAGNOSIS — J449 Chronic obstructive pulmonary disease, unspecified: Secondary | ICD-10-CM | POA: Diagnosis not present

## 2018-04-09 DIAGNOSIS — Z79899 Other long term (current) drug therapy: Secondary | ICD-10-CM | POA: Diagnosis not present

## 2018-04-09 DIAGNOSIS — Z79811 Long term (current) use of aromatase inhibitors: Secondary | ICD-10-CM | POA: Diagnosis not present

## 2018-04-09 DIAGNOSIS — R42 Dizziness and giddiness: Secondary | ICD-10-CM | POA: Diagnosis not present

## 2018-04-09 DIAGNOSIS — Z9011 Acquired absence of right breast and nipple: Secondary | ICD-10-CM | POA: Insufficient documentation

## 2018-04-09 DIAGNOSIS — Z8052 Family history of malignant neoplasm of bladder: Secondary | ICD-10-CM | POA: Diagnosis not present

## 2018-04-09 DIAGNOSIS — C50812 Malignant neoplasm of overlapping sites of left female breast: Secondary | ICD-10-CM | POA: Diagnosis present

## 2018-04-09 DIAGNOSIS — Z853 Personal history of malignant neoplasm of breast: Secondary | ICD-10-CM | POA: Diagnosis not present

## 2018-04-09 DIAGNOSIS — E785 Hyperlipidemia, unspecified: Secondary | ICD-10-CM | POA: Diagnosis not present

## 2018-04-09 DIAGNOSIS — M810 Age-related osteoporosis without current pathological fracture: Secondary | ICD-10-CM | POA: Diagnosis not present

## 2018-04-09 DIAGNOSIS — M545 Low back pain, unspecified: Secondary | ICD-10-CM

## 2018-04-09 DIAGNOSIS — M1990 Unspecified osteoarthritis, unspecified site: Secondary | ICD-10-CM | POA: Diagnosis not present

## 2018-04-09 DIAGNOSIS — I10 Essential (primary) hypertension: Secondary | ICD-10-CM | POA: Insufficient documentation

## 2018-04-09 MED ORDER — ANASTROZOLE 1 MG PO TABS: 1.0000 mg | ORAL_TABLET | Freq: Every day | ORAL | 3 refills | Status: DC

## 2018-04-09 MED ORDER — DULOXETINE HCL 60 MG PO CPEP: 60.0000 mg | ORAL_CAPSULE | Freq: Every day | ORAL | 1 refills | Status: DC

## 2018-04-09 NOTE — Progress Notes (Signed)
Pt wants refill of cymbalta, and she has been having dizziness at times with her walking. She had long term back pain and she can go to care program to try to do better walking and try to get rid of cane. She wants care program.

## 2018-04-13 NOTE — Progress Notes (Signed)
Hematology/Oncology Consult note [facility]  [phone] [phone]  Patient Care Team: [name], MD as PCP - General (Family Medicine)   Name of the patient: Heidi [name]  [id number]  1941-09-14   Date of visit: 04/13/18  Diagnosis- stage II left breast cancer ER/PR positive and HER-2/neu negative  Chief complaint/ Reason for visit- routine f/u of breast cancer  Heme/Onc history:  Oncology History   # March 2016- LEFT BREAST CANCER STAGE II ER- 90%; PR-90%; Her 2 neu- NEG; NEO-ADJ CHEMO-  AC x4- Taxol x12 w;;s/p LUMPEC & SLNBx [ypT1 [1.5cm]; ypsN 3/6 (2 macro & 1 micro met)]  s/p RT [Nov 1st week;finish] NOV 2016- START FEMARA stop Feb 2017; April 2017- start Arimidex  # Right Breast cancer ? Stage;  s/p Mastec [1990;s/p Chemo; RT ]  # PN G-2 on neurontin; Hx PE on eliquis  # Mediport     Cancer of overlapping sites of left female breast ([place])   06/09/2016 Initial Diagnosis    Cancer of overlapping sites of left female breast ([place])        Interval history- jaw pain has resolved. She is back to taking foasmax. She does have chronic back paina nd ambulates with a cane. She is interested in participating in the cARE program for better endurance and hopes to get rid of the cane soon  Reports occasional lightheadedness when she sits up and walks. Denies any headaches, focal numbness and weakness. Symptoms have been ongoing for a week  ECOG PS- 2 Pain scale- 4- chronic back pain  Review of systems- Review of Systems  Constitutional: Negative for chills, fever, malaise/fatigue and weight loss.  HENT: Negative for congestion, ear discharge and nosebleeds.   Eyes: Negative for blurred vision.  Respiratory: Negative for cough, hemoptysis, sputum production, shortness of breath and wheezing.   Cardiovascular: Negative for chest pain, palpitations, orthopnea and claudication.  Gastrointestinal: Negative for abdominal pain,  blood in stool, constipation, diarrhea, heartburn, melena, nausea and vomiting.  Genitourinary: Negative for dysuria, flank pain, frequency, hematuria and urgency.  Musculoskeletal: Positive for back pain. Negative for joint pain and myalgias.  Skin: Negative for rash.  Neurological: Positive for dizziness. Negative for tingling, focal weakness, seizures, weakness and headaches.  Endo/Heme/Allergies: Does not bruise/bleed easily.  Psychiatric/Behavioral: Negative for depression and suicidal ideas. The patient does not have insomnia.       Allergies  Allergen Reactions  . Diphenhydramine Other (See Comments)    pt states makes legs very restless and ache  . Hydrochloric Acid Hives    Other reaction(s): Blisters Uncoded Allergy. Allergen: HYDROCHLORLIC ACID.     Past Medical History:  Diagnosis Date  . Anemia   . Anxiety   . Breast cancer ([place]) 1990   RT MASTECTOMY  . Breast cancer ([place]) 2015   LT LUMPECTOMY  . C. difficile diarrhea    see dr Vira Agar and this is her second course of vancomycin  . Cataracts, bilateral   . COPD (chronic obstructive pulmonary disease) (HCC)    mild COPD  . Depression   . Dyspnea    with exersion  . Hepatitis 1960's   does not know which type  . Hyperlipidemia   . Hypertension   . Insomnia   . Neuropathy due to chemotherapeutic drug ([place])   . Osteoarthritis   . Osteoporosis   . UTI (lower urinary tract infection)      Past Surgical History:  Procedure Laterality Date  . APPENDECTOMY  1970's  . BREAST BIOPSY Left 05/07/2015   Procedure: BREAST BIOPSY WITH NEEDLE LOCALIZATION;  Surgeon: Dia Crawford III, MD;  Location: [facility] ORS;  Service: General;  Laterality: Left;  . BREAST LUMPECTOMY WITH SENTINEL LYMPH NODE BIOPSY Left 05/07/2015   Procedure: PARTIAL MASTECTOMY WITH SENTINEL LYMPH NODE BX;  Surgeon: Dia Crawford III, MD;  Location: [facility] ORS;  Service: General;  Laterality: Left;  . BREAST SURGERY Right 1990   mastectomy  . EYE SURGERY  Bilateral    cataract extractions  . MASTECTOMY Right    1990's  . PORT-A-CATH REMOVAL Right 03/07/2017   Procedure: REMOVAL PORT-A-CATH;  Surgeon: Vickie Epley, MD;  Location: [facility] ORS;  Service: General;  Laterality: Right;    Social History   Socioeconomic History  . Marital status: Divorced    Spouse name: Not on file  . Number of children: Not on file  . Years of education: Not on file  . Highest education level: Not on file  Occupational History  . Not on file  Social Needs  . Financial resource strain: Not on file  . Food insecurity:    Worry: Not on file    Inability: Not on file  . Transportation needs:    Medical: Not on file    Non-medical: Not on file  Tobacco Use  . Smoking status: Former Smoker    Packs/day: 2.00    Years: 25.00    Pack years: 50.00    Types: Cigarettes    Last attempt to quit: 04/28/1992    Years since quitting: 25.9  . Smokeless tobacco: Never Used  . Tobacco comment: Pt quit 1995  Substance and Sexual Activity  . Alcohol use: No    Alcohol/week: 0.0 oz  . Drug use: No  . Sexual activity: Never  Lifestyle  . Physical activity:    Days per week: Not on file    Minutes per session: Not on file  . Stress: Not on file  Relationships  . Social connections:    Talks on phone: Not on file    Gets together: Not on file    Attends religious service: Not on file    Active member of club or organization: Not on file    Attends meetings of clubs or organizations: Not on file    Relationship status: Not on file  . Intimate partner violence:    Fear of current or ex partner: Not on file    Emotionally abused: Not on file    Physically abused: Not on file    Forced sexual activity: Not on file  Other Topics Concern  . Not on file  Social History Narrative  . Not on file    Family History  Problem Relation Age of Onset  . Dementia Mother   . Bladder Cancer Father   . Diabetes Sister   . Breast cancer Neg Hx      Current  Outpatient Medications:  .  acetaminophen (TYLENOL) 500 MG tablet, Take 500 mg by mouth 3 (three) times daily as needed for pain., Disp: , Rfl:  .  alendronate (FOSAMAX) 70 MG tablet, TAKE ONE TABLET BY MOUTH WEEKLY (7 DAYS) WITH A FULL GLASS OF WATER AND ON AN EMPTY STOMACH, Disp: 4 tablet, Rfl: 1 .  anastrozole (ARIMIDEX) 1 MG tablet, Take 1 tablet (1 mg total) by mouth daily., Disp: 90 tablet, Rfl: 3 .  BucAlfAspKGlucCouchParsUvaUrJu (WATER PILLS PO), Take 1-2 tablets by mouth daily as needed (for fluid retention)., Disp: ,  Rfl:  .  busPIRone (BUSPAR) 7.5 MG tablet, , Disp: , Rfl:  .  calcium citrate (CALCITRATE - DOSED IN MG ELEMENTAL CALCIUM) 950 MG tablet, Take 200 mg of elemental calcium by mouth daily., Disp: , Rfl:  .  DULoxetine (CYMBALTA) 60 MG capsule, Take 1 capsule (60 mg total) by mouth daily., Disp: 180 capsule, Rfl: 1 .  gabapentin (NEURONTIN) 300 MG capsule, TAKE TWO CAPSULES BY MOUTH TWICE A DAY, Disp: 120 capsule, Rfl: 0 .  losartan (COZAAR) 50 MG tablet, Take 50 mg by mouth daily., Disp: , Rfl:  .  Multiple Vitamin (MULTIVITAMIN WITH MINERALS) TABS tablet, Take 1 tablet by mouth at bedtime. Senior Multivitamin, Disp: , Rfl:  .  NON FORMULARY, Take 1 capsule by mouth daily. CBD oil, Disp: , Rfl:  .  Red Yeast Rice 600 MG CAPS, Take 2 capsules by mouth daily., Disp: , Rfl:  .  BuPROPion HCl (WELLBUTRIN PO), Take 1 tablet by mouth 2 (two) times daily., Disp: , Rfl:  .  Cholecalciferol (VITAMIN D3) 5000 units CAPS, Take 5,000 Units by mouth every evening. , Disp: , Rfl:  .  Coenzyme Q10-Red Yeast Rice 60-600 MG CAPS, Take 2 capsules by mouth every evening., Disp: , Rfl:  .  zaleplon (SONATA) 5 MG capsule, , Disp: , Rfl:  .  zolpidem (AMBIEN) 10 MG tablet, Take 1 tablet (10 mg total) by mouth at bedtime. (Patient not taking: Reported on 12/25/2017), Disp: 30 tablet, Rfl: 1 No current facility-administered medications for this visit.   Facility-Administered Medications Ordered in  Other Visits:  .  sodium chloride 0.9 % injection 10 mL, 10 mL, Intravenous, PRN, Ma Hillock, Sandeep, MD .  sodium chloride 0.9 % injection 10 mL, 10 mL, Intravenous, PRN, Leia Alf, MD, 10 mL at 06/23/15 1236 .  sodium chloride flush (NS) 0.9 % injection 10 mL, 10 mL, Intravenous, PRN, Cammie Sickle, MD, 10 mL at 01/28/16 1421  Physical exam:  Vitals:   04/09/18 1541  BP: 125/79  Pulse: 96  Resp: 18  Temp: 98.1 F (36.7 C)  TempSrc: Tympanic  Weight: 225 lb 8 oz (102.3 kg)  Height: '5\' 5"'  (1.651 m)   Physical Exam  Constitutional: She is oriented to person, place, and time.  Elderly female in no acute distress. She is ambulating with a cane  HENT:  Head: Normocephalic and atraumatic.  Eyes: Pupils are equal, round, and reactive to light. EOM are normal.  Neck: Normal range of motion.  Cardiovascular: Normal rate, regular rhythm and normal heart sounds.  Pulmonary/Chest: Effort normal and breath sounds normal.  Abdominal: Soft. Bowel sounds are normal.  Neurological: She is alert and oriented to person, place, and time.  Skin: Skin is warm and dry.  Breast exam was performed in seated and lying down position. Patient is status post left lumpectomy with a well-healed surgical scar. No evidence of any palpable masses. No evidence of axillary adenopathy. No evidence of any palpable masses or lumps in the right breast. No evidence of right axillary adenopathy   CMP Latest Ref Rng & Units 09/01/2015  Glucose 65 - 99 mg/dL 104(H)  BUN 6 - 20 mg/dL 17  Creatinine 0.44 - 1.00 mg/dL 0.63  Sodium 135 - 145 mmol/L 135  Potassium 3.5 - 5.1 mmol/L 4.0  Chloride 101 - 111 mmol/L 103  CO2 22 - 32 mmol/L 26  Calcium 8.9 - 10.3 mg/dL 8.3(L)  Total Protein 6.5 - 8.1 g/dL 7.1  Total Bilirubin 0.3 - 1.2  mg/dL 0.5  Alkaline Phos 38 - 126 U/L 79  AST 15 - 41 U/L 15  ALT 14 - 54 U/L 13(L)   CBC Latest Ref Rng & Units 09/01/2015  WBC 3.6 - 11.0 K/uL 6.4  Hemoglobin 12.0 - 16.0 g/dL  11.7(L)  Hematocrit 35.0 - 47.0 % 35.0  Platelets 150 - 440 K/uL 229     Assessment and plan- Patient is a 77 y.o. female with a history of Stage II left breast cancer ER/PR positive HER-2/neu negative status post neoadjuvant chemotherapy followed by lumpectomy and radiation. She is currently on Arimidex. She is here for routine f/u of her breast cancer  1. Breast cancer- continue arimidex. Tolerating it well without side effects  2. Osteoporosis: continue fosamax weekly  3. Chemo induced peripheral neuropathy- reports significant improvement on cymbalta will refill prescription today  4. Dizziness- cause unclear. No focal neurological symptoms. If symptoms persist, she will call us and we will consider doing MRI brain  She will be referred to care program. I will see her in 6 months. No labs. Mammogram scheduled for July 2019    Visit Diagnosis 1. Chronic low back pain without sciatica, unspecified back pain laterality   2. Malignant neoplasm of overlapping sites of left breast in female, estrogen receptor positive ([place])      Dr. Randa Evens, MD, MPH [facility] [phone] 04/13/2018 9:31 AM

## 2018-04-30 DIAGNOSIS — R7303 Prediabetes: Secondary | ICD-10-CM | POA: Insufficient documentation

## 2018-04-30 DIAGNOSIS — G620 Drug-induced polyneuropathy: Secondary | ICD-10-CM | POA: Insufficient documentation

## 2018-05-03 DIAGNOSIS — Z87891 Personal history of nicotine dependence: Secondary | ICD-10-CM | POA: Insufficient documentation

## 2018-05-06 ENCOUNTER — Other Ambulatory Visit: Payer: Self-pay

## 2018-05-07 ENCOUNTER — Other Ambulatory Visit: Payer: Self-pay | Admitting: Oncology

## 2018-05-31 ENCOUNTER — Other Ambulatory Visit: Payer: Self-pay | Admitting: Oncology

## 2018-06-02 ENCOUNTER — Other Ambulatory Visit: Payer: Self-pay | Admitting: Oncology

## 2018-06-06 ENCOUNTER — Other Ambulatory Visit: Payer: Self-pay | Admitting: Oncology

## 2018-06-12 ENCOUNTER — Ambulatory Visit
Admission: RE | Admit: 2018-06-12 | Discharge: 2018-06-12 | Disposition: A | Discharge status: Home / Self Care | Payer: Medicare Other | Source: Ambulatory Visit | Attending: Oncology | Admitting: Oncology

## 2018-06-12 DIAGNOSIS — C50412 Malignant neoplasm of upper-outer quadrant of left female breast: Secondary | ICD-10-CM

## 2018-06-12 DIAGNOSIS — Z170 Estrogen receptor positive status [ER+]: Principal | ICD-10-CM

## 2018-06-12 HISTORY — DX: Personal history of irradiation: Z92.3

## 2018-07-03 ENCOUNTER — Other Ambulatory Visit: Payer: Self-pay | Admitting: Oncology

## 2018-07-30 ENCOUNTER — Other Ambulatory Visit: Payer: Self-pay | Admitting: Oncology

## 2018-08-27 ENCOUNTER — Other Ambulatory Visit: Payer: Self-pay | Admitting: Oncology

## 2018-09-27 ENCOUNTER — Other Ambulatory Visit: Payer: Self-pay | Admitting: Oncology

## 2018-10-10 ENCOUNTER — Inpatient Hospital Stay: Payer: Medicare Other | Attending: Oncology | Admitting: Oncology

## 2018-10-10 ENCOUNTER — Encounter: Payer: Self-pay | Admitting: Oncology

## 2018-10-10 VITALS — BP 130/83 | HR 93 | Temp 98.9°F | Resp 18 | Ht 65.0 in | Wt 220.1 lb

## 2018-10-10 DIAGNOSIS — Z9221 Personal history of antineoplastic chemotherapy: Secondary | ICD-10-CM | POA: Insufficient documentation

## 2018-10-10 DIAGNOSIS — Z08 Encounter for follow-up examination after completed treatment for malignant neoplasm: Secondary | ICD-10-CM

## 2018-10-10 DIAGNOSIS — Z87891 Personal history of nicotine dependence: Secondary | ICD-10-CM | POA: Insufficient documentation

## 2018-10-10 DIAGNOSIS — Z170 Estrogen receptor positive status [ER+]: Secondary | ICD-10-CM | POA: Diagnosis not present

## 2018-10-10 DIAGNOSIS — Z7901 Long term (current) use of anticoagulants: Secondary | ICD-10-CM | POA: Insufficient documentation

## 2018-10-10 DIAGNOSIS — R5383 Other fatigue: Secondary | ICD-10-CM | POA: Insufficient documentation

## 2018-10-10 DIAGNOSIS — Z86711 Personal history of pulmonary embolism: Secondary | ICD-10-CM | POA: Diagnosis not present

## 2018-10-10 DIAGNOSIS — I10 Essential (primary) hypertension: Secondary | ICD-10-CM | POA: Diagnosis not present

## 2018-10-10 DIAGNOSIS — Z9011 Acquired absence of right breast and nipple: Secondary | ICD-10-CM | POA: Diagnosis not present

## 2018-10-10 DIAGNOSIS — J449 Chronic obstructive pulmonary disease, unspecified: Secondary | ICD-10-CM | POA: Insufficient documentation

## 2018-10-10 DIAGNOSIS — M1990 Unspecified osteoarthritis, unspecified site: Secondary | ICD-10-CM | POA: Insufficient documentation

## 2018-10-10 DIAGNOSIS — Z79811 Long term (current) use of aromatase inhibitors: Secondary | ICD-10-CM | POA: Diagnosis not present

## 2018-10-10 DIAGNOSIS — Z853 Personal history of malignant neoplasm of breast: Secondary | ICD-10-CM | POA: Insufficient documentation

## 2018-10-10 DIAGNOSIS — E785 Hyperlipidemia, unspecified: Secondary | ICD-10-CM | POA: Diagnosis not present

## 2018-10-10 DIAGNOSIS — Z79899 Other long term (current) drug therapy: Secondary | ICD-10-CM | POA: Diagnosis not present

## 2018-10-10 DIAGNOSIS — R0789 Other chest pain: Secondary | ICD-10-CM | POA: Diagnosis not present

## 2018-10-10 DIAGNOSIS — Z923 Personal history of irradiation: Secondary | ICD-10-CM | POA: Insufficient documentation

## 2018-10-10 DIAGNOSIS — C50812 Malignant neoplasm of overlapping sites of left female breast: Secondary | ICD-10-CM | POA: Diagnosis not present

## 2018-10-10 DIAGNOSIS — R5381 Other malaise: Secondary | ICD-10-CM | POA: Insufficient documentation

## 2018-10-10 DIAGNOSIS — M810 Age-related osteoporosis without current pathological fracture: Secondary | ICD-10-CM | POA: Diagnosis not present

## 2018-10-11 NOTE — Progress Notes (Signed)
Hematology/Oncology Consult note [facility]  [phone] [phone]  Patient Care Team: Glendon Axe, MD as PCP - General (Internal Medicine)   Name of the patient: Heidi [name]  [id number]  September 22, 1941   Date of visit: 10/11/18  Diagnosis- stage II left breast cancer ER/PR positive and HER-2/neu negative   Chief complaint/ Reason for visit- routine f/u of breast cancer  Heme/Onc history:  Oncology History   # March 2016- LEFT BREAST CANCER STAGE II ER- 90%; PR-90%; Her 2 neu- NEG; NEO-ADJ CHEMO-  AC x4- Taxol x12 w;;s/p LUMPEC & SLNBx [ypT1 [1.5cm]; ypsN 3/6 (2 macro & 1 micro met)]  s/p RT [Nov 1st week;finish] NOV 2016- START FEMARA stop Feb 2017; April 2017- start Arimidex  # Right Breast cancer ? Stage;  s/p Mastec [1990;s/p Chemo; RT ]  # PN G-2 on neurontin; Hx PE on eliquis  # Mediport     Cancer of overlapping sites of left female breast ([place])   06/09/2016 Initial Diagnosis    Cancer of overlapping sites of left female breast [facility])     Interval history-today she is concerned about being under her left breast along her rib.  She has some symptoms of sinusitis and pharyngitis she says and is on oral Augmentin at this time.  She has been having some cough secondary to it.  She has been compliant with her Arimidex along with Fosamax.  Denies any new side effects from her Arimidex. We  ECOG PS- 2 Pain scale- 0 Opioid associated constipation- no  Review of systems- Review of Systems  Constitutional: Positive for malaise/fatigue. Negative for chills, fever and weight loss.  HENT: Negative for congestion, ear discharge and nosebleeds.   Eyes: Negative for blurred vision.  Respiratory: Negative for cough, hemoptysis, sputum production, shortness of breath and wheezing.        Left chest wall pain  Cardiovascular: Negative for chest pain, palpitations, orthopnea and claudication.  Gastrointestinal: Negative for abdominal  pain, blood in stool, constipation, diarrhea, heartburn, melena, nausea and vomiting.  Genitourinary: Negative for dysuria, flank pain, frequency, hematuria and urgency.  Musculoskeletal: Negative for back pain, joint pain and myalgias.  Skin: Negative for rash.  Neurological: Negative for dizziness, tingling, focal weakness, seizures, weakness and headaches.  Endo/Heme/Allergies: Does not bruise/bleed easily.  Psychiatric/Behavioral: Negative for depression and suicidal ideas. The patient does not have insomnia.        Allergies  Allergen Reactions  . Diphenhydramine Other (See Comments)    pt states makes legs very restless and ache  . Hydrochloric Acid Hives    Other reaction(s): Blisters Uncoded Allergy. Allergen: HYDROCHLORLIC ACID.     Past Medical History:  Diagnosis Date  . Anemia   . Anxiety   . Breast cancer ([place]) 1990   RT MASTECTOMY  . Breast cancer ([place]) 2015   LT LUMPECTOMY  . C. difficile diarrhea    see dr Vira Agar and this is her second course of vancomycin  . Cataracts, bilateral   . COPD (chronic obstructive pulmonary disease) (HCC)    mild COPD  . Depression   . Dyspnea    with exersion  . Hepatitis 1960's   does not know which type  . Hyperlipidemia   . Hypertension   . Insomnia   . Neuropathy due to chemotherapeutic drug ([place])   . Osteoarthritis   . Osteoporosis   . Personal history of radiation therapy 2016   LEFT lumpectomy  . UTI (lower urinary tract  infection)      Past Surgical History:  Procedure Laterality Date  . APPENDECTOMY  1970's  . BREAST BIOPSY Left 05/07/2015   Procedure: BREAST BIOPSY WITH NEEDLE LOCALIZATION;  Surgeon: Dia Crawford III, MD;  Location: [facility] ORS;  Service: General;  Laterality: Left;  . BREAST LUMPECTOMY Left 2016  . BREAST LUMPECTOMY WITH SENTINEL LYMPH NODE BIOPSY Left 05/07/2015   Procedure: PARTIAL MASTECTOMY WITH SENTINEL LYMPH NODE BX;  Surgeon: Dia Crawford III, MD;  Location: [facility] ORS;  Service: General;   Laterality: Left;  . BREAST SURGERY Right 1990   mastectomy  . EYE SURGERY Bilateral    cataract extractions  . MASTECTOMY Right    1990's  . PORT-A-CATH REMOVAL Right 03/07/2017   Procedure: REMOVAL PORT-A-CATH;  Surgeon: Vickie Epley, MD;  Location: [facility] ORS;  Service: General;  Laterality: Right;    Social History   Socioeconomic History  . Marital status: Divorced    Spouse name: Not on file  . Number of children: Not on file  . Years of education: Not on file  . Highest education level: Not on file  Occupational History  . Not on file  Social Needs  . Financial resource strain: Not on file  . Food insecurity:    Worry: Not on file    Inability: Not on file  . Transportation needs:    Medical: Not on file    Non-medical: Not on file  Tobacco Use  . Smoking status: Former Smoker    Packs/day: 2.00    Years: 25.00    Pack years: 50.00    Types: Cigarettes    Last attempt to quit: 04/28/1992    Years since quitting: 26.4  . Smokeless tobacco: Never Used  . Tobacco comment: Pt quit 1995  Substance and Sexual Activity  . Alcohol use: No    Alcohol/week: 0.0 standard drinks  . Drug use: No  . Sexual activity: Never  Lifestyle  . Physical activity:    Days per week: Not on file    Minutes per session: Not on file  . Stress: Not on file  Relationships  . Social connections:    Talks on phone: Not on file    Gets together: Not on file    Attends religious service: Not on file    Active member of club or organization: Not on file    Attends meetings of clubs or organizations: Not on file    Relationship status: Not on file  . Intimate partner violence:    Fear of current or ex partner: Not on file    Emotionally abused: Not on file    Physically abused: Not on file    Forced sexual activity: Not on file  Other Topics Concern  . Not on file  Social History Narrative  . Not on file    Family History  Problem Relation Age of Onset  . Dementia Mother   .  Bladder Cancer Father   . Diabetes Sister   . Breast cancer Neg Hx      Current Outpatient Medications:  .  alendronate (FOSAMAX) 70 MG tablet, TAKE 1 TABLET BY MOUTH ONCE WEEKLY BEFORE BREAKFAST ON AN EMPTY STOMACH WITH 8 OUNCES OF WATER. REMAIN UPRIGHT FOR 30 MINUTES, Disp: 4 tablet, Rfl: 0 .  amoxicillin-clavulanate (AUGMENTIN) 875-125 MG tablet, Take by mouth., Disp: , Rfl:  .  anastrozole (ARIMIDEX) 1 MG tablet, Take 1 tablet (1 mg total) by mouth daily., Disp: 90 tablet, Rfl: 3 .  BuPROPion HCl (WELLBUTRIN PO), Take 1 tablet by mouth 2 (two) times daily., Disp: , Rfl:  .  calcium citrate (CALCITRATE - DOSED IN MG ELEMENTAL CALCIUM) 950 MG tablet, Take 200 mg of elemental calcium by mouth daily., Disp: , Rfl:  .  Cholecalciferol (VITAMIN D3) 5000 units CAPS, Take 5,000 Units by mouth every evening. , Disp: , Rfl:  .  DULoxetine (CYMBALTA) 60 MG capsule, Take 1 capsule (60 mg total) by mouth daily., Disp: 180 capsule, Rfl: 1 .  gabapentin (NEURONTIN) 300 MG capsule, TAKE TWO CAPSULES BY MOUTH TWICE A DAY, Disp: 120 capsule, Rfl: 0 .  Multiple Vitamin (MULTIVITAMIN WITH MINERALS) TABS tablet, Take 1 tablet by mouth at bedtime. Senior Multivitamin, Disp: , Rfl:  .  NON FORMULARY, Take 1 capsule by mouth daily. CBD oil, Disp: , Rfl:  .  Red Yeast Rice 600 MG CAPS, Take 2 capsules by mouth daily., Disp: , Rfl:  .  acetaminophen (TYLENOL) 500 MG tablet, Take 500 mg by mouth 3 (three) times daily as needed for pain., Disp: , Rfl:  .  BucAlfAspKGlucCouchParsUvaUrJu (WATER PILLS PO), Take 1-2 tablets by mouth daily as needed (for fluid retention)., Disp: , Rfl:  .  busPIRone (BUSPAR) 7.5 MG tablet, , Disp: , Rfl:  .  Coenzyme Q10-Red Yeast Rice 60-600 MG CAPS, Take 2 capsules by mouth every evening., Disp: , Rfl:  .  losartan (COZAAR) 50 MG tablet, Take 50 mg by mouth daily., Disp: , Rfl:  .  zaleplon (SONATA) 5 MG capsule, , Disp: , Rfl:  .  zolpidem (AMBIEN) 10 MG tablet, Take 1 tablet (10 mg  total) by mouth at bedtime. (Patient not taking: Reported on 12/25/2017), Disp: 30 tablet, Rfl: 1 No current facility-administered medications for this visit.   Facility-Administered Medications Ordered in Other Visits:  .  sodium chloride 0.9 % injection 10 mL, 10 mL, Intravenous, PRN, Ma Hillock, Sandeep, MD .  sodium chloride 0.9 % injection 10 mL, 10 mL, Intravenous, PRN, Leia Alf, MD, 10 mL at 06/23/15 1236 .  sodium chloride flush (NS) 0.9 % injection 10 mL, 10 mL, Intravenous, PRN, Cammie Sickle, MD, 10 mL at 01/28/16 1421  Physical exam:  Vitals:   10/10/18 1430  BP: 130/83  Pulse: 93  Resp: 18  Temp: 98.9 F (37.2 C)  TempSrc: Tympanic  SpO2: 94%  Weight: 220 lb 1.6 oz (99.8 kg)  Height: _0  (1.651 m)   Physical Exam Constitutional:      Comments: Patient is elderly and ambulates with a cane.  Appears in no acute distress  HENT:     Head: Normocephalic and atraumatic.  Eyes:     Pupils: Pupils are equal, round, and reactive to light.  Neck:     Musculoskeletal: Normal range of motion.  Cardiovascular:     Rate and Rhythm: Normal rate and regular rhythm.     Heart sounds: Normal heart sounds.  Pulmonary:     Effort: Pulmonary effort is normal.     Breath sounds: Normal breath sounds.  Abdominal:     General: Bowel sounds are normal.     Palpations: Abdomen is soft.  Skin:    General: Skin is warm and dry.  Neurological:     Mental Status: She is alert and oriented to person, place, and time.   Patient is status post right mastectomy without reconstruction.  No evidence of chest wall recurrence.  Patient is status post left lumpectomy with a well-healed surgical scar.  No  palpable masses in the left breast.  No palpable bilateral axillary adenopathy.  I do not feel any palpable mass/swelling under her left breast.  There is mild tenderness to palpation around her rib cage  CMP Latest Ref Rng & Units 09/01/2015  Glucose 65 - 99 mg/dL 104(H)  BUN 6 - 20  mg/dL 17  Creatinine 0.44 - 1.00 mg/dL 0.63  Sodium 135 - 145 mmol/L 135  Potassium 3.5 - 5.1 mmol/L 4.0  Chloride 101 - 111 mmol/L 103  CO2 22 - 32 mmol/L 26  Calcium 8.9 - 10.3 mg/dL 8.3(L)  Total Protein 6.5 - 8.1 g/dL 7.1  Total Bilirubin 0.3 - 1.2 mg/dL 0.5  Alkaline Phos 38 - 126 U/L 79  AST 15 - 41 U/L 15  ALT 14 - 54 U/L 13(L)   CBC Latest Ref Rng & Units 09/01/2015  WBC 3.6 - 11.0 K/uL 6.4  Hemoglobin 12.0 - 16.0 g/dL 11.7(L)  Hematocrit 35.0 - 47.0 % 35.0  Platelets 150 - 440 K/uL 229      Assessment and plan- Patient is a 77 y.o. female with a history of Stage II left breast cancer ER/PR positive HER-2/neu negative status post neoadjuvant chemotherapy followed by lumpectomy and radiation.  She is here for routine follow-up of her breast cancer on Arimidex  Patient will continue Arimidex at least until 2021 if not for 5 more years after that along with calcium vitamin D and Fosamax.  Suspect that her left chest wall pain is musculoskeletal due to underlying symptoms of URI and cough.  If she does not feel better in the next 1 to 2 weeks she can call us and speak to her primary care doctor about this.  Recent mammogram in August 2019 did not reveal any evidence of malignancy and clinically she is doing well with no evidence of recurrence on today's exam  I will see her back in 6 months with a bone density scan prior   Visit Diagnosis 1. Malignant neoplasm of overlapping sites of left breast in female, estrogen receptor positive ([place])   2. Osteoporosis without current pathological fracture, unspecified osteoporosis type   3. Encounter for follow-up surveillance of breast cancer      Dr. Randa Evens, MD, MPH [facility] [phone] 10/11/2018 8:50 AM

## 2018-10-31 ENCOUNTER — Other Ambulatory Visit: Payer: Self-pay | Admitting: Oncology

## 2018-11-25 ENCOUNTER — Other Ambulatory Visit: Payer: Self-pay | Admitting: Oncology

## 2018-12-24 ENCOUNTER — Other Ambulatory Visit: Payer: Self-pay | Admitting: Oncology

## 2019-01-24 ENCOUNTER — Other Ambulatory Visit: Payer: Self-pay | Admitting: Oncology

## 2019-02-22 ENCOUNTER — Other Ambulatory Visit: Payer: Self-pay | Admitting: Oncology

## 2019-03-05 ENCOUNTER — Other Ambulatory Visit: Payer: Self-pay

## 2019-03-16 ENCOUNTER — Other Ambulatory Visit: Payer: Self-pay | Admitting: Oncology

## 2019-04-10 ENCOUNTER — Other Ambulatory Visit: Payer: Self-pay

## 2019-04-11 ENCOUNTER — Other Ambulatory Visit: Payer: Self-pay

## 2019-04-11 ENCOUNTER — Encounter: Payer: Self-pay | Admitting: Oncology

## 2019-04-11 ENCOUNTER — Inpatient Hospital Stay: Payer: Medicare Other | Attending: Oncology | Admitting: Oncology

## 2019-04-11 DIAGNOSIS — Z79811 Long term (current) use of aromatase inhibitors: Secondary | ICD-10-CM

## 2019-04-11 DIAGNOSIS — Z170 Estrogen receptor positive status [ER+]: Secondary | ICD-10-CM | POA: Diagnosis not present

## 2019-04-11 DIAGNOSIS — Z9221 Personal history of antineoplastic chemotherapy: Secondary | ICD-10-CM

## 2019-04-11 DIAGNOSIS — Z7901 Long term (current) use of anticoagulants: Secondary | ICD-10-CM

## 2019-04-11 DIAGNOSIS — Z87891 Personal history of nicotine dependence: Secondary | ICD-10-CM

## 2019-04-11 DIAGNOSIS — Z86711 Personal history of pulmonary embolism: Secondary | ICD-10-CM

## 2019-04-11 DIAGNOSIS — C50812 Malignant neoplasm of overlapping sites of left female breast: Secondary | ICD-10-CM

## 2019-04-11 DIAGNOSIS — I10 Essential (primary) hypertension: Secondary | ICD-10-CM

## 2019-04-11 DIAGNOSIS — Z5181 Encounter for therapeutic drug level monitoring: Secondary | ICD-10-CM

## 2019-04-11 DIAGNOSIS — Z79899 Other long term (current) drug therapy: Secondary | ICD-10-CM

## 2019-04-11 DIAGNOSIS — Z853 Personal history of malignant neoplasm of breast: Secondary | ICD-10-CM

## 2019-04-11 DIAGNOSIS — Z08 Encounter for follow-up examination after completed treatment for malignant neoplasm: Secondary | ICD-10-CM

## 2019-04-11 DIAGNOSIS — Z9011 Acquired absence of right breast and nipple: Secondary | ICD-10-CM

## 2019-04-11 NOTE — Progress Notes (Signed)
Pt has had a cough and went to see pulonary and given tessalon pearls and flonase spray and ct on Monday. She does not have any fever now but haas had some in the past. She does felt something different in her breast at tht escar tissue site of the lumpectomy

## 2019-04-14 NOTE — Progress Notes (Signed)
I connected with Heidi [name] on 04/14/19 at  2:30 PM EDT by video enabled telemedicine visit and verified that I am speaking with the correct person using two identifiers.   I discussed the limitations, risks, security and privacy concerns of performing an evaluation and management service by telemedicine and the availability of in-person appointments. I also discussed with the patient that there may be a patient responsible charge related to this service. The patient expressed understanding and agreed to proceed.  Other persons participating in the visit and their role in the encounter:  none  Patient's location:  home Provider's location:  Work  Diagnosis: stage II left breast cancer ER/PR positive and HER-2/neu negative  Chief Complaint:  Routine f/u of breast cancer  History of present illness:  Oncology History Overview Note  # March 2016- LEFT BREAST CANCER STAGE II ER- 90%; PR-90%; Her 2 neu- NEG; NEO-ADJ CHEMO-  AC x4- Taxol x12 w;;s/p LUMPEC & SLNBx [ypT1 [1.5cm]; ypsN 3/6 (2 macro & 1 micro met)]  s/p RT [Nov 1st week;finish] NOV 2016- START FEMARA stop Feb 2017; April 2017- start Arimidex  # Right Breast cancer ? Stage;  s/p Mastec [1990;s/p Chemo; RT ]  # PN G-2 on neurontin; Hx PE on eliquis  # Mediport   Cancer of overlapping sites of left female breast ([place])  06/09/2016 Initial Diagnosis   Cancer of overlapping sites of left female breast ([place])      Interval history tolerating arimidex with calcium and vit D well. She reports having mild productive cough and saw pulmonary as well. She reports some discomfort along her mastectomy scar   Review of Systems  Constitutional: Negative for chills, fever, malaise/fatigue and weight loss.  HENT: Negative for congestion, ear discharge and nosebleeds.   Eyes: Negative for blurred vision.  Respiratory: Positive for cough. Negative for hemoptysis, sputum production, shortness of breath and wheezing.   Cardiovascular: Negative  for chest pain, palpitations, orthopnea and claudication.  Gastrointestinal: Negative for abdominal pain, blood in stool, constipation, diarrhea, heartburn, melena, nausea and vomiting.  Genitourinary: Negative for dysuria, flank pain, frequency, hematuria and urgency.  Musculoskeletal: Negative for back pain, joint pain and myalgias.  Skin: Negative for rash.  Neurological: Negative for dizziness, tingling, focal weakness, seizures, weakness and headaches.  Endo/Heme/Allergies: Does not bruise/bleed easily.  Psychiatric/Behavioral: Negative for depression and suicidal ideas. The patient does not have insomnia.     Allergies  Allergen Reactions  . Diphenhydramine Other (See Comments)    pt states makes legs very restless and ache  . Hydrochloric Acid Hives    Other reaction(s): Blisters Uncoded Allergy. Allergen: HYDROCHLORLIC ACID.    Past Medical History:  Diagnosis Date  . Anemia   . Anxiety   . Breast cancer ([place]) 1990   RT MASTECTOMY  . Breast cancer ([place]) 2015   LT LUMPECTOMY  . C. difficile diarrhea    see dr Vira Agar and this is her second course of vancomycin  . Cataracts, bilateral   . COPD (chronic obstructive pulmonary disease) ([facility])    mild COPD  . Depression   . Dyspnea    with exersion  . Hepatitis 1960's   does not know which type  . Hyperlipidemia   . Hypertension   . Insomnia   . Neuropathy due to chemotherapeutic drug ([place])   . Osteoarthritis   . Osteoporosis   . Personal history of radiation therapy 2016   LEFT lumpectomy  . UTI (lower urinary tract infection)  Past Surgical History:  Procedure Laterality Date  . APPENDECTOMY  1970's  . BREAST BIOPSY Left 05/07/2015   Procedure: BREAST BIOPSY WITH NEEDLE LOCALIZATION;  Surgeon: Dia Crawford III, MD;  Location: [facility] ORS;  Service: General;  Laterality: Left;  . BREAST LUMPECTOMY Left 2016  . BREAST LUMPECTOMY WITH SENTINEL LYMPH NODE BIOPSY Left 05/07/2015   Procedure: PARTIAL MASTECTOMY WITH  SENTINEL LYMPH NODE BX;  Surgeon: Dia Crawford III, MD;  Location: [facility] OR[facility];  Service: General;  Laterality: Left;  . BREAST SURGERY Right 1990   mastectomy  . EYE SURGERY Bilateral    cataract extractions  . MASTECTOMY Right    1990's  . PORT-A-CATH REMOVAL Right 03/07/2017   Procedure: REMOVAL PORT-A-CATH;  Surgeon: Vickie Epley, MD;  Location: [facility] OR[facility];  Service: General;  Laterality: Right;    Social History   Socioeconomic History  . Marital status: Divorced    Spouse name: Not on file  . Number of children: Not on file  . Years of education: Not on file  . Highest education level: Not on file  Occupational History  . Not on file  Social Needs  . Financial resource strain: Not on file  . Food insecurity    Worry: Not on file    Inability: Not on file  . Transportation needs    Medical: Not on file    Non-medical: Not on file  Tobacco Use  . Smoking status: Former Smoker    Packs/day: 2.00    Years: 25.00    Pack years: 50.00    Types: Cigarettes    Quit date: 04/28/1992    Years since quitting: 26.9  . Smokeless tobacco: Never Used  . Tobacco comment: Pt quit 1995  Substance and Sexual Activity  . Alcohol use: No    Alcohol/week: 0.0 standard drinks  . Drug use: No  . Sexual activity: Never  Lifestyle  . Physical activity    Days per week: Not on file    Minutes per session: Not on file  . Stress: Not on file  Relationships  . Social Herbalist on phone: Not on file    Gets together: Not on file    Attends religious service: Not on file    Active member of club or organization: Not on file    Attends meetings of clubs or organizations: Not on file    Relationship status: Not on file  . Intimate partner violence    Fear of current or ex partner: Not on file    Emotionally abused: Not on file    Physically abused: Not on file    Forced sexual activity: Not on file  Other Topics Concern  . Not on file  Social History Narrative  . Not on file     Family History  Problem Relation Age of Onset  . Dementia Mother   . Bladder Cancer Father   . Diabetes Sister   . Breast cancer Neg Hx      Current Outpatient Medications:  .  alendronate (FOSAMAX) 70 MG tablet, TAKE ONE TABLET BY MOUTH ONCE WEEKLY ON AN EMPTY STOMACH BEFORE BREAKFAST WITH 8 OZ OF WATER. REMAIN UPRIGHT FOR 30 MINUTES AFTER TAKING., Disp: 4 tablet, Rfl: 0 .  anastrozole (ARIMIDEX) 1 MG tablet, Take 1 tablet (1 mg total) by mouth daily., Disp: 90 tablet, Rfl: 3 .  BucAlfAspKGlucCouchParsUvaUrJu (WATER PILLS PO), Take 1-2 tablets by mouth daily as needed (for fluid retention)., Disp: , Rfl:  .  busPIRone (BUSPAR) 7.5 MG tablet, Take 15 mg by mouth 2 (two) times daily. , Disp: , Rfl:  .  Calcium Carb-Cholecalciferol (CALCIUM 600 + D PO), Take 1 tablet by mouth 2 (two) times a day., Disp: , Rfl:  .  Cholecalciferol (VITAMIN D3) 5000 units CAPS, Take 5,000 Units by mouth every evening. , Disp: , Rfl:  .  DULoxetine (CYMBALTA) 60 MG capsule, Take 1 capsule (60 mg total) by mouth daily., Disp: 180 capsule, Rfl: 1 .  losartan (COZAAR) 100 MG tablet, Take 100 mg by mouth daily., Disp: , Rfl:  .  Multiple Vitamin (MULTIVITAMIN WITH MINERALS) TABS tablet, Take 1 tablet by mouth at bedtime. Senior Multivitamin, Disp: , Rfl:  .  NON FORMULARY, Take 200 mg by mouth daily., Disp: , Rfl:  .  Red Yeast Rice 600 MG CAPS, Take 2 capsules by mouth daily., Disp: , Rfl:  .  tizanidine (ZANAFLEX) 2 MG capsule, Take 2 mg by mouth at bedtime as needed for muscle spasms (2- 4 mg as needed at night)., Disp: , Rfl:  .  traZODone (DESYREL) 150 MG tablet, Take 150 mg by mouth at bedtime., Disp: , Rfl:  .  acetaminophen (TYLENOL) 500 MG tablet, Take 500 mg by mouth 3 (three) times daily as needed for pain., Disp: , Rfl:  No current facility-administered medications for this visit.   Facility-Administered Medications Ordered in Other Visits:  .  sodium chloride 0.9 % injection 10 mL, 10 mL,  Intravenous, PRN, Ma Hillock, Sandeep, MD .  sodium chloride 0.9 % injection 10 mL, 10 mL, Intravenous, PRN, Leia Alf, MD, 10 mL at 06/23/15 1236 .  sodium chloride flush (NS) 0.9 % injection 10 mL, 10 mL, Intravenous, PRN, Charlaine Dalton R, MD, 10 mL at 01/28/16 1421  No results found.  No images are attached to the encounter.   CMP Latest Ref Rng & Units 09/01/2015  Glucose 65 - 99 mg/dL 104(H)  BUN 6 - 20 mg/dL 17  Creatinine 0.44 - 1.00 mg/dL 0.63  Sodium 135 - 145 mmol/L 135  Potassium 3.5 - 5.1 mmol/L 4.0  Chloride 101 - 111 mmol/L 103  CO2 22 - 32 mmol/L 26  Calcium 8.9 - 10.3 mg/dL 8.3(L)  Total Protein 6.5 - 8.1 g/dL 7.1  Total Bilirubin 0.3 - 1.2 mg/dL 0.5  Alkaline Phos 38 - 126 U/L 79  AST 15 - 41 U/L 15  ALT 14 - 54 U/L 13(L)   CBC Latest Ref Rng & Units 09/01/2015  WBC 3.6 - 11.0 K/uL 6.4  Hemoglobin 12.0 - 16.0 g/dL 11.7(L)  Hematocrit 35.0 - 47.0 % 35.0  Platelets 150 - 440 K/uL 229     Observation/objective: appears in no acute distress over video visit today. Breathing is non labored  Assessment and plan:Patient is a 78 y.o. female with a history of Stage II left breast cancer ER/PR positive HER-2/neu negative status post neoadjuvant chemotherapy followed by lumpectomy and radiation. She is currently on Arimidex. This is a routine f/u visit for breast cancer  Overall tolerating arimidex calcium and vit D well. She could not come for in person appointment due to her cough and concern for COVID. Needs mammogram next month and bone density scan as well  Follow-up instructions: I will see her in 3 months time with cbc with diff and cmp  I discussed the assessment and treatment plan with the patient. The patient was provided an opportunity to ask questions and all were answered. The patient agreed with the  plan and demonstrated an understanding of the instructions.   The patient was advised to call back or seek an in-person evaluation if the symptoms  worsen or if the condition fails to improve as anticipated.   Visit Diagnosis: 1. Encounter for follow-up surveillance of breast cancer   2. Visit for monitoring Arimidex therapy     Dr. Randa Evens, MD, MPH [facility] at [facility] Pager- [phone] 04/14/2019 9:06 AM

## 2019-04-18 ENCOUNTER — Other Ambulatory Visit: Payer: Self-pay | Admitting: Oncology

## 2019-04-18 DIAGNOSIS — C50812 Malignant neoplasm of overlapping sites of left female breast: Secondary | ICD-10-CM

## 2019-05-12 ENCOUNTER — Other Ambulatory Visit: Payer: Self-pay | Admitting: *Deleted

## 2019-05-12 MED ORDER — ALENDRONATE SODIUM 70 MG PO TABS: ORAL_TABLET | ORAL | 2 refills | Status: DC

## 2019-06-16 ENCOUNTER — Encounter: Payer: Self-pay | Admitting: Oncology

## 2019-06-16 ENCOUNTER — Ambulatory Visit
Admission: RE | Admit: 2019-06-16 | Discharge: 2019-06-16 | Disposition: A | Discharge status: Home / Self Care | Payer: Medicare Other | Source: Ambulatory Visit | Attending: Oncology | Admitting: Oncology

## 2019-06-16 ENCOUNTER — Telehealth: Payer: Self-pay | Admitting: *Deleted

## 2019-06-16 DIAGNOSIS — Z853 Personal history of malignant neoplasm of breast: Secondary | ICD-10-CM | POA: Insufficient documentation

## 2019-06-16 DIAGNOSIS — Z08 Encounter for follow-up examination after completed treatment for malignant neoplasm: Secondary | ICD-10-CM | POA: Insufficient documentation

## 2019-06-16 NOTE — Telephone Encounter (Signed)
Called pt and left her a message that her bone density was cancelled due to covid virus and it was cancelled on 5/1 by mammography staff. At some point during the virus the mammography office was not even open. I apologized that it had not been rescheduled. And I did make a new appt and I made it for afternoon because I know she likes afternoon appt. I was going to send her a my chart message but it has not been set up yet. So I called pt. Back and told her the new appt is 8/24 at 3 pm and arrive 15 min early. I also asked her to call me so I know she got the message and if there is anything else I can do to help.

## 2019-06-16 NOTE — Telephone Encounter (Signed)
Can you tell me what happended so I can call her? Bone density is ordered not scheduled

## 2019-06-16 NOTE — Telephone Encounter (Signed)
Patient called reporting that she went for her mammogram and BMD today, but they only did her mammogram stating they did not have an order for the BMD. She is upset and states this is the second time this has been cancelled and she wants to speak with you about it. Please return her call [phone]

## 2019-06-18 ENCOUNTER — Encounter: Payer: Self-pay | Admitting: *Deleted

## 2019-06-23 ENCOUNTER — Other Ambulatory Visit: Payer: Medicare Other

## 2019-06-24 ENCOUNTER — Ambulatory Visit
Admission: RE | Admit: 2019-06-24 | Discharge: 2019-06-24 | Disposition: A | Discharge status: Home / Self Care | Payer: Medicare Other | Source: Ambulatory Visit | Attending: Oncology | Admitting: Oncology

## 2019-06-24 DIAGNOSIS — C50812 Malignant neoplasm of overlapping sites of left female breast: Secondary | ICD-10-CM | POA: Diagnosis present

## 2019-06-24 DIAGNOSIS — Z170 Estrogen receptor positive status [ER+]: Secondary | ICD-10-CM | POA: Diagnosis present

## 2019-06-24 DIAGNOSIS — M810 Age-related osteoporosis without current pathological fracture: Secondary | ICD-10-CM | POA: Diagnosis present

## 2019-06-24 DIAGNOSIS — Z853 Personal history of malignant neoplasm of breast: Secondary | ICD-10-CM | POA: Diagnosis present

## 2019-06-24 DIAGNOSIS — Z08 Encounter for follow-up examination after completed treatment for malignant neoplasm: Secondary | ICD-10-CM | POA: Insufficient documentation

## 2019-07-01 ENCOUNTER — Encounter: Payer: Self-pay | Admitting: *Deleted

## 2019-07-11 ENCOUNTER — Telehealth: Payer: Self-pay | Admitting: *Deleted

## 2019-07-11 NOTE — Progress Notes (Signed)
Patient is coming in for follow up, she is doing well no complaints. Patient is needing rx for 6 mastectomy bras and prosthesis to take with her.

## 2019-07-11 NOTE — Telephone Encounter (Signed)
[place] was calling pt's to do screening for visit on Monday and pt. Mentioned that she had not heard from me for the prosthesis and bras. I had talked to her on the phone she did not want me to fax it to [facility]. Then I sent her my chart message of how to get it to her. She never answered . I spoke to pt today and she said to keep it til I see her next week.

## 2019-07-14 ENCOUNTER — Inpatient Hospital Stay: Payer: Medicare Other | Attending: Oncology

## 2019-07-14 ENCOUNTER — Inpatient Hospital Stay (HOSPITAL_BASED_OUTPATIENT_CLINIC_OR_DEPARTMENT_OTHER): Payer: Medicare Other | Admitting: Oncology

## 2019-07-14 ENCOUNTER — Other Ambulatory Visit: Payer: Self-pay

## 2019-07-14 VITALS — BP 124/82 | Temp 98.1°F | Resp 16 | Wt 213.0 lb

## 2019-07-14 DIAGNOSIS — Z08 Encounter for follow-up examination after completed treatment for malignant neoplasm: Secondary | ICD-10-CM | POA: Diagnosis not present

## 2019-07-14 DIAGNOSIS — R5383 Other fatigue: Secondary | ICD-10-CM | POA: Diagnosis not present

## 2019-07-14 DIAGNOSIS — J449 Chronic obstructive pulmonary disease, unspecified: Secondary | ICD-10-CM | POA: Diagnosis not present

## 2019-07-14 DIAGNOSIS — G620 Drug-induced polyneuropathy: Secondary | ICD-10-CM | POA: Diagnosis not present

## 2019-07-14 DIAGNOSIS — Z923 Personal history of irradiation: Secondary | ICD-10-CM | POA: Insufficient documentation

## 2019-07-14 DIAGNOSIS — Z853 Personal history of malignant neoplasm of breast: Secondary | ICD-10-CM | POA: Diagnosis not present

## 2019-07-14 DIAGNOSIS — Z79811 Long term (current) use of aromatase inhibitors: Secondary | ICD-10-CM | POA: Diagnosis not present

## 2019-07-14 DIAGNOSIS — E785 Hyperlipidemia, unspecified: Secondary | ICD-10-CM | POA: Insufficient documentation

## 2019-07-14 DIAGNOSIS — Z9221 Personal history of antineoplastic chemotherapy: Secondary | ICD-10-CM | POA: Diagnosis not present

## 2019-07-14 DIAGNOSIS — M810 Age-related osteoporosis without current pathological fracture: Secondary | ICD-10-CM | POA: Insufficient documentation

## 2019-07-14 DIAGNOSIS — I10 Essential (primary) hypertension: Secondary | ICD-10-CM | POA: Diagnosis not present

## 2019-07-14 DIAGNOSIS — Z170 Estrogen receptor positive status [ER+]: Secondary | ICD-10-CM | POA: Diagnosis not present

## 2019-07-14 DIAGNOSIS — Z79899 Other long term (current) drug therapy: Secondary | ICD-10-CM | POA: Diagnosis not present

## 2019-07-14 DIAGNOSIS — T451X5S Adverse effect of antineoplastic and immunosuppressive drugs, sequela: Secondary | ICD-10-CM | POA: Insufficient documentation

## 2019-07-14 DIAGNOSIS — Z87891 Personal history of nicotine dependence: Secondary | ICD-10-CM | POA: Insufficient documentation

## 2019-07-14 DIAGNOSIS — C50812 Malignant neoplasm of overlapping sites of left female breast: Secondary | ICD-10-CM | POA: Insufficient documentation

## 2019-07-14 DIAGNOSIS — Z5181 Encounter for therapeutic drug level monitoring: Secondary | ICD-10-CM

## 2019-07-14 DIAGNOSIS — R5381 Other malaise: Secondary | ICD-10-CM | POA: Diagnosis not present

## 2019-07-14 DIAGNOSIS — Z9011 Acquired absence of right breast and nipple: Secondary | ICD-10-CM | POA: Insufficient documentation

## 2019-07-14 DIAGNOSIS — M549 Dorsalgia, unspecified: Secondary | ICD-10-CM | POA: Diagnosis not present

## 2019-07-14 DIAGNOSIS — M1990 Unspecified osteoarthritis, unspecified site: Secondary | ICD-10-CM | POA: Insufficient documentation

## 2019-07-14 LAB — CBC WITH DIFFERENTIAL/PLATELET
Abs Immature Granulocytes: 0.03 10*3/uL (ref 0.00–0.07)
Basophils Absolute: 0.1 10*3/uL (ref 0.0–0.1)
Basophils Relative: 1 %
Eosinophils Absolute: 0.1 10*3/uL (ref 0.0–0.5)
Eosinophils Relative: 2 %
HCT: 41.5 % (ref 36.0–46.0)
Hemoglobin: 13.7 g/dL (ref 12.0–15.0)
Immature Granulocytes: 0 %
Lymphocytes Relative: 21 %
Lymphs Abs: 1.6 10*3/uL (ref 0.7–4.0)
MCH: 32.9 pg (ref 26.0–34.0)
MCHC: 33 g/dL (ref 30.0–36.0)
MCV: 99.8 fL (ref 80.0–100.0)
Monocytes Absolute: 0.6 10*3/uL (ref 0.1–1.0)
Monocytes Relative: 8 %
Neutro Abs: 5.1 10*3/uL (ref 1.7–7.7)
Neutrophils Relative %: 68 %
Platelets: 245 10*3/uL (ref 150–400)
RBC: 4.16 MIL/uL (ref 3.87–5.11)
RDW: 12.5 % (ref 11.5–15.5)
WBC: 7.6 10*3/uL (ref 4.0–10.5)
nRBC: 0 % (ref 0.0–0.2)

## 2019-07-14 LAB — COMPREHENSIVE METABOLIC PANEL
ALT: 24 U/L (ref 0–44)
AST: 26 U/L (ref 15–41)
Albumin: 4.6 g/dL (ref 3.5–5.0)
Alkaline Phosphatase: 76 U/L (ref 38–126)
Anion gap: 10 (ref 5–15)
BUN: 19 mg/dL (ref 8–23)
CO2: 24 mmol/L (ref 22–32)
Calcium: 9.1 mg/dL (ref 8.9–10.3)
Chloride: 108 mmol/L (ref 98–111)
Creatinine, Ser: 0.98 mg/dL (ref 0.44–1.00)
GFR calc Af Amer: >60 mL/min (ref >60)
GFR calc non Af Amer: 56 mL/min — ABNORMAL LOW (ref >60)
Glucose, Bld: 81 mg/dL (ref 70–99)
Potassium: 5 mmol/L (ref 3.5–5.1)
Sodium: 142 mmol/L (ref 135–145)
Total Bilirubin: 0.8 mg/dL (ref 0.3–1.2)
Total Protein: 7.9 g/dL (ref 6.5–8.1)

## 2019-07-15 ENCOUNTER — Encounter: Payer: Self-pay | Admitting: Oncology

## 2019-07-15 NOTE — Progress Notes (Signed)
Hematology/Oncology Consult note [facility]  [phone] [phone]  Patient Care Team: Idelle Crouch, MD as PCP - General (Internal Medicine)   Name of the patient: Cathryn Gallery  [id number]  09-Aug-1941   Date of visit: 07/15/19  Diagnosis- stage II left breast cancer ER/PR positive and HER-2/neu negative   Chief complaint/ Reason for visit-routine follow-up of breast cancer  Heme/Onc history:  Oncology History Overview Note  # March 2016- LEFT BREAST CANCER STAGE II ER- 90%; PR-90%; Her 2 neu- NEG; NEO-ADJ CHEMO-  AC x4- Taxol x12 w;;s/p LUMPEC & SLNBx [ypT1 [1.5cm]; ypsN 3/6 (2 macro & 1 micro met)]  s/p RT [Nov 1st week;finish] NOV 2016- START FEMARA stop Feb 2017; April 2017- start Arimidex  # Right Breast cancer ? Stage;  s/p Mastec [1990;s/p Chemo; RT ]  # PN G-2 on neurontin; Hx PE on eliquis  # Mediport   Cancer of overlapping sites of left female breast ([place])  06/09/2016 Initial Diagnosis   Cancer of overlapping sites of left female breast ([place])    Interval history-patient continues to be on calcium and Fosamax vitamin D as well as Arimidex.  She started taking turmeric and feels that her back pain has improved since then.  Also reports improvement in her neuropathy symptoms after starting the Cymbalta  ECOG PS- 1 Pain scale- 0   Review of systems- Review of Systems  Constitutional: Positive for malaise/fatigue. Negative for chills, fever and weight loss.  HENT: Negative for congestion, ear discharge and nosebleeds.   Eyes: Negative for blurred vision.  Respiratory: Negative for cough, hemoptysis, sputum production, shortness of breath and wheezing.   Cardiovascular: Negative for chest pain, palpitations, orthopnea and claudication.  Gastrointestinal: Negative for abdominal pain, blood in stool, constipation, diarrhea, heartburn, melena, nausea and vomiting.  Genitourinary: Negative for dysuria, flank pain,  frequency, hematuria and urgency.  Musculoskeletal: Positive for back pain. Negative for joint pain and myalgias.  Skin: Negative for rash.  Neurological: Negative for dizziness, tingling, focal weakness, seizures, weakness and headaches.  Endo/Heme/Allergies: Does not bruise/bleed easily.  Psychiatric/Behavioral: Negative for depression and suicidal ideas. The patient does not have insomnia.      Allergies  Allergen Reactions   Diphenhydramine Other (See Comments)    pt states makes legs very restless and ache   Hydrochloric Acid Hives    Other reaction(s): Blisters Uncoded Allergy. Allergen: HYDROCHLORLIC ACID.     Past Medical History:  Diagnosis Date   Anemia    Anxiety    Breast cancer ([place]) 1990   RT MASTECTOMY   Breast cancer ([place]) 2015   LT LUMPECTOMY   C. difficile diarrhea    see dr Vira Agar and this is her second course of vancomycin   Cataracts, bilateral    COPD (chronic obstructive pulmonary disease) ([place])    mild COPD   Depression    Dyspnea    with exersion   Hepatitis 1960's   does not know which type   Hyperlipidemia    Hypertension    Insomnia    Neuropathy due to chemotherapeutic drug ([place])    Osteoarthritis    Osteoporosis    Personal history of radiation therapy 2016   LEFT lumpectomy   UTI (lower urinary tract infection)      Past Surgical History:  Procedure Laterality Date   APPENDECTOMY  1970's   BREAST BIOPSY Left 05/07/2015   Procedure: BREAST BIOPSY WITH NEEDLE LOCALIZATION;  Surgeon: Dia Crawford III, MD;  Location: [facility] ORS;  Service: General;  Laterality: Left;   BREAST LUMPECTOMY Left 2016   BREAST LUMPECTOMY WITH SENTINEL LYMPH NODE BIOPSY Left 05/07/2015   Procedure: PARTIAL MASTECTOMY WITH SENTINEL LYMPH NODE BX;  Surgeon: Dia Crawford III, MD;  Location: [facility] ORS;  Service: General;  Laterality: Left;   BREAST SURGERY Right 1990   mastectomy   EYE SURGERY Bilateral    cataract extractions   MASTECTOMY  Right    1990's   PORT-A-CATH REMOVAL Right 03/07/2017   Procedure: REMOVAL PORT-A-CATH;  Surgeon: Vickie Epley, MD;  Location: [facility] ORS;  Service: General;  Laterality: Right;    Social History   Socioeconomic History   Marital status: Divorced    Spouse name: Not on file   Number of children: Not on file   Years of education: Not on file   Highest education level: Not on file  Occupational History   Not on file  Social Needs   Financial resource strain: Not on file   Food insecurity    Worry: Not on file    Inability: Not on file   Transportation needs    Medical: Not on file    Non-medical: Not on file  Tobacco Use   Smoking status: Former Smoker    Packs/day: 2.00    Years: 25.00    Pack years: 50.00    Types: Cigarettes    Quit date: 04/28/1992    Years since quitting: 27.2   Smokeless tobacco: Never Used   Tobacco comment: Pt quit 1995  Substance and Sexual Activity   Alcohol use: No    Alcohol/week: 0.0 standard drinks   Drug use: No   Sexual activity: Never  Lifestyle   Physical activity    Days per week: Not on file    Minutes per session: Not on file   Stress: Not on file  Relationships   Social connections    Talks on phone: Not on file    Gets together: Not on file    Attends religious service: Not on file    Active member of club or organization: Not on file    Attends meetings of clubs or organizations: Not on file    Relationship status: Not on file   Intimate partner violence    Fear of current or ex partner: Not on file    Emotionally abused: Not on file    Physically abused: Not on file    Forced sexual activity: Not on file  Other Topics Concern   Not on file  Social History Narrative   Not on file    Family History  Problem Relation Age of Onset   Dementia Mother    Bladder Cancer Father    Diabetes Sister    Breast cancer Neg Hx      Current Outpatient Medications:    acetaminophen (TYLENOL) 500  MG tablet, Take 500 mg by mouth 3 (three) times daily as needed for pain., Disp: , Rfl:    alendronate (FOSAMAX) 70 MG tablet, TAKE ONE TABLET BY MOUTH ONCE WEEKLY ON AN EMPTY STOMACH BEFORE BREAKFAST WITH 8 OZ OF WATER. REMAIN UPRIGHT FOR 30 MINUTES AFTER TAKING., Disp: 4 tablet, Rfl: 2   anastrozole (ARIMIDEX) 1 MG tablet, TAKE ONE TABLET BY MOUTH DAILY, Disp: 90 tablet, Rfl: 2   BucAlfAspKGlucCouchParsUvaUrJu (WATER PILLS PO), Take 1-2 tablets by mouth daily as needed (for fluid retention)., Disp: , Rfl:    busPIRone (BUSPAR) 7.5 MG tablet, Take 15 mg by mouth  2 (two) times daily. , Disp: , Rfl:    Calcium Carb-Cholecalciferol (CALCIUM 600 + D PO), Take 1 tablet by mouth 2 (two) times a day., Disp: , Rfl:    Cholecalciferol (VITAMIN D3) 5000 units CAPS, Take 5,000 Units by mouth every evening. , Disp: , Rfl:    DULoxetine (CYMBALTA) 60 MG capsule, Take 1 capsule (60 mg total) by mouth daily., Disp: 180 capsule, Rfl: 1   losartan (COZAAR) 100 MG tablet, Take 100 mg by mouth daily., Disp: , Rfl:    Multiple Vitamin (MULTIVITAMIN WITH MINERALS) TABS tablet, Take 1 tablet by mouth at bedtime. Senior Multivitamin, Disp: , Rfl:    NON FORMULARY, Take 200 mg by mouth daily., Disp: , Rfl:    Red Yeast Rice 600 MG CAPS, Take 2 capsules by mouth daily., Disp: , Rfl:    Tiotropium Bromide Monohydrate 2.5 MCG/ACT AERS, Inhale into the lungs., Disp: , Rfl:    tizanidine (ZANAFLEX) 2 MG capsule, Take 2 mg by mouth at bedtime as needed for muscle spasms (2- 4 mg as needed at night)., Disp: , Rfl:    traZODone (DESYREL) 150 MG tablet, Take 150 mg by mouth at bedtime., Disp: , Rfl:    Turmeric 400 MG CAPS, Take 1 capsule by mouth 2 (two) times daily., Disp: , Rfl:  No current facility-administered medications for this visit.   Facility-Administered Medications Ordered in Other Visits:    sodium chloride 0.9 % injection 10 mL, 10 mL, Intravenous, PRN, Ma Hillock, Sandeep, MD   sodium chloride 0.9 %  injection 10 mL, 10 mL, Intravenous, PRN, Ma Hillock, Sandeep, MD, 10 mL at 06/23/15 1236   sodium chloride flush (NS) 0.9 % injection 10 mL, 10 mL, Intravenous, PRN, Cammie Sickle, MD, 10 mL at 01/28/16 1421  Physical exam:  Vitals:   07/14/19 1108  BP: 124/82  Resp: 16  Temp: 98.1 F (36.7 C)  TempSrc: Tympanic  Weight: 213 lb (96.6 kg)   Physical Exam Constitutional:      General: She is not in acute distress. HENT:     Head: Normocephalic and atraumatic.  Eyes:     Pupils: Pupils are equal, round, and reactive to light.  Neck:     Musculoskeletal: Normal range of motion.  Cardiovascular:     Rate and Rhythm: Normal rate and regular rhythm.     Heart sounds: Normal heart sounds.  Pulmonary:     Effort: Pulmonary effort is normal.     Breath sounds: Normal breath sounds.  Abdominal:     General: Bowel sounds are normal.     Palpations: Abdomen is soft.  Skin:    General: Skin is warm and dry.  Neurological:     Mental Status: She is alert and oriented to person, place, and time.   Patient is status post right mastectomy without reconstruction.  No evidence of chest wall recurrence or right axillary adenopathy.  She is status post left lumpectomy with a well-healed surgical scar.  No palpable masses over the left breast.  No palpable left axillary adenopathy  CMP Latest Ref Rng & Units 07/14/2019  Glucose 70 - 99 mg/dL 81  BUN 8 - 23 mg/dL 19  Creatinine 0.44 - 1.00 mg/dL 0.98  Sodium 135 - 145 mmol/L 142  Potassium 3.5 - 5.1 mmol/L 5.0  Chloride 98 - 111 mmol/L 108  CO2 22 - 32 mmol/L 24  Calcium 8.9 - 10.3 mg/dL 9.1  Total Protein 6.5 - 8.1 g/dL 7.9  Total Bilirubin  0.3 - 1.2 mg/dL 0.8  Alkaline Phos 38 - 126 U/L 76  AST 15 - 41 U/L 26  ALT 0 - 44 U/L 24   CBC Latest Ref Rng & Units 07/14/2019  WBC 4.0 - 10.5 K/uL 7.6  Hemoglobin 12.0 - 15.0 g/dL 13.7  Hematocrit 36.0 - 46.0 % 41.5  Platelets 150 - 400 K/uL 245    No images are attached to the  encounter.  Dg Bone Density  Result Date: 06/24/2019 EXAM: DUAL X-RAY ABSORPTIOMETRY (DXA) FOR BONE MINERAL DENSITY IMPRESSION: Technologist:VLM Your patient Lajeana Strough completed a BMD test on 06/24/2019 using the [place] (analysis version: 14.10) manufactured by [employer]. The following summarizes the results of our evaluation. PATIENT BIOGRAPHICAL: Name: Shresta, Risden Patient ID: [id number] Birth Date: 09/05/1941 Height: 65.0 in. Gender: Female Exam Date: 06/24/2019 Weight: 214.0 lbs. Indications: Advanced Age, Breast CA, Caucasian, COPD, Height Loss, Postmenopausal Fractures: Treatments: Arimidex, Calcium, Fosamax, Vitamin D ASSESSMENT: The BMD measured at Femur Neck Right is 0.662 g/cm2 with a T-score of -2.7. This patient is considered osteoporotic according to [facility]) criteria. Lumbar spine was not utilized due to advanced degenerative changes. The scan quality is good. Site Region Measured Measured WHO Young Adult BMD Date       Age      Classification T-score DualFemur Neck Right 06/24/2019 77.2 Osteoporosis -2.7 0.662 g/cm2 DualFemur Neck Right 03/01/2017 74.9 Osteoporosis -3.0 0.615 g/cm2 DualFemur Total Mean 06/24/2019 77.2 Osteopenia -2.4 0.712 g/cm2 DualFemur Total Mean 03/01/2017 74.9 Osteoporosis -2.9 0.642 g/cm2 Left Forearm Radius 33% 06/24/2019 77.2 Osteopenia -1.5 0.748 g/cm2 Left Forearm Radius 33% 03/01/2017 74.9 Normal -0.7 0.814 g/cm2 World Health Organization [facility]) criteria for post-menopausal, Caucasian Women: Normal:       T-score at or above -1 SD Osteopenia:   T-score between -1 and -2.5 SD Osteoporosis: T-score at or below -2.5 SD RECOMMENDATIONS: 1. All patients should optimize calcium and vitamin D intake. 2. Consider FDA-approved medical therapies in postmenopausal women and men aged 84 years and older, based on the following: a. A hip or vertebral(clinical or morphometric) fracture b. T-score < -2.5 at the femoral neck or spine after  appropriate evaluation to exclude secondary causes c. Low bone mass (T-score between -1.0 and -2.5 at the femoral neck or spine) and a 10-year probability of a hip fracture > 3% or a 10-year probability of a major osteoporosis-related fracture > 20% based on the [place]-adapted WHO algorithm d. Clinician judgment and/or patient preferences may indicate treatment for people with 10-year fracture probabilities above or below these levels FOLLOW-UP: People with diagnosed cases of osteoporosis or at high risk for fracture should have regular bone mineral density tests. For patients eligible for Medicare, routine testing is allowed once every 2 years. The testing frequency can be increased to one year for patients who have rapidly progressing disease, those who are receiving or discontinuing medical therapy to restore bone mass, or have additional risk factors. I have reviewed this report, and agree with the above findings. [facility] Electronically Signed   By: Lowella Grip III M.D.   On: 06/24/2019 15:55   Mm Diag Breast Tomo Uni Left  Result Date: 06/16/2019 CLINICAL DATA:  Patient presents for a diagnostic left breast exam due to a history of a previous left malignant lumpectomy 2016. Previous malignant right mastectomy 1990. EXAM: DIGITAL DIAGNOSTIC UNILATERAL LEFT MAMMOGRAM WITH CAD AND TOMO COMPARISON:  Previous exam(s). ACR Breast Density Category b: There are scattered areas of  fibroglandular density. FINDINGS: Exam demonstrates stable post lumpectomy changes over the inner midportion of the left breast and stable postsurgical changes over the left axilla. Left breast is otherwise unchanged. Mammographic images were processed with CAD. IMPRESSION: Stable postsurgical changes of the left breast. RECOMMENDATION: Recommend annual follow-up diagnostic examination of the left breast. I have discussed the findings and recommendations with the patient. Results were also provided in writing at the  conclusion of the visit. If applicable, a reminder letter will be sent to the patient regarding the next appointment. BI-RADS CATEGORY  2: Benign. Electronically Signed   By: Marin Olp M.D.   On: 06/16/2019 11:39     Assessment and plan- Patient is a 78 y.o. female with a history of Stage II left breast cancer ER/PR positive HER-2/neu negative status post neoadjuvant chemotherapy followed by lumpectomy and radiation. She is currently on Arimidex.   She is here for routine follow-up of breast cancer.   1.  Clinically she is doing well and no evidence of recurrence based on today's exam.  She is currently on Arimidex and tolerating it well.  She will finish taking 5 years of Arimidex in November 2021.  Given her ongoing osteo-porosis, it may not be in her best interest to continue it for 5 more years despite having a recurrent breast cancer.  2.  Osteoporosis: Currently on calcium vitamin D and Fosamax.  Most recent bone density scan is overall stable.  Continue to monitor  3.  Chemo-induced peripheral neuropathy: She is currently on Cymbalta and has good control of her symptoms  I will see her back in 6 months.  No labs     Visit Diagnosis 1. Osteoporosis without current pathological fracture, unspecified osteoporosis type   2. Encounter for follow-up surveillance of breast cancer   3. Visit for monitoring Arimidex therapy      Dr. Randa Evens, MD, MPH [facility] at [facility] [phone] 07/15/2019 9:10 AM

## 2019-07-24 ENCOUNTER — Other Ambulatory Visit: Payer: Self-pay | Admitting: Oncology

## 2019-09-17 ENCOUNTER — Other Ambulatory Visit: Payer: Self-pay | Admitting: Oncology

## 2020-01-15 ENCOUNTER — Encounter: Payer: Self-pay | Admitting: Oncology

## 2020-01-15 ENCOUNTER — Other Ambulatory Visit: Payer: Self-pay

## 2020-01-15 ENCOUNTER — Inpatient Hospital Stay: Payer: Medicare Other | Attending: Oncology | Admitting: Oncology

## 2020-01-15 VITALS — BP 145/72 | HR 73 | Temp 97.8°F | Ht 65.0 in | Wt 222.0 lb

## 2020-01-15 DIAGNOSIS — Z7901 Long term (current) use of anticoagulants: Secondary | ICD-10-CM | POA: Diagnosis not present

## 2020-01-15 DIAGNOSIS — Z79899 Other long term (current) drug therapy: Secondary | ICD-10-CM | POA: Insufficient documentation

## 2020-01-15 DIAGNOSIS — R5381 Other malaise: Secondary | ICD-10-CM | POA: Diagnosis not present

## 2020-01-15 DIAGNOSIS — G620 Drug-induced polyneuropathy: Secondary | ICD-10-CM | POA: Diagnosis not present

## 2020-01-15 DIAGNOSIS — E785 Hyperlipidemia, unspecified: Secondary | ICD-10-CM | POA: Diagnosis not present

## 2020-01-15 DIAGNOSIS — J449 Chronic obstructive pulmonary disease, unspecified: Secondary | ICD-10-CM | POA: Insufficient documentation

## 2020-01-15 DIAGNOSIS — R5383 Other fatigue: Secondary | ICD-10-CM | POA: Insufficient documentation

## 2020-01-15 DIAGNOSIS — T451X5A Adverse effect of antineoplastic and immunosuppressive drugs, initial encounter: Secondary | ICD-10-CM | POA: Diagnosis not present

## 2020-01-15 DIAGNOSIS — C50812 Malignant neoplasm of overlapping sites of left female breast: Secondary | ICD-10-CM | POA: Insufficient documentation

## 2020-01-15 DIAGNOSIS — I10 Essential (primary) hypertension: Secondary | ICD-10-CM | POA: Insufficient documentation

## 2020-01-15 DIAGNOSIS — Z853 Personal history of malignant neoplasm of breast: Secondary | ICD-10-CM | POA: Insufficient documentation

## 2020-01-15 DIAGNOSIS — Z170 Estrogen receptor positive status [ER+]: Secondary | ICD-10-CM | POA: Insufficient documentation

## 2020-01-15 DIAGNOSIS — F418 Other specified anxiety disorders: Secondary | ICD-10-CM | POA: Diagnosis not present

## 2020-01-15 DIAGNOSIS — Z87891 Personal history of nicotine dependence: Secondary | ICD-10-CM | POA: Diagnosis not present

## 2020-01-15 DIAGNOSIS — Z86711 Personal history of pulmonary embolism: Secondary | ICD-10-CM | POA: Insufficient documentation

## 2020-01-15 DIAGNOSIS — Z79811 Long term (current) use of aromatase inhibitors: Secondary | ICD-10-CM | POA: Insufficient documentation

## 2020-01-15 DIAGNOSIS — M810 Age-related osteoporosis without current pathological fracture: Secondary | ICD-10-CM | POA: Diagnosis not present

## 2020-01-15 DIAGNOSIS — M1990 Unspecified osteoarthritis, unspecified site: Secondary | ICD-10-CM | POA: Insufficient documentation

## 2020-01-15 DIAGNOSIS — Z08 Encounter for follow-up examination after completed treatment for malignant neoplasm: Secondary | ICD-10-CM

## 2020-01-15 DIAGNOSIS — Z9011 Acquired absence of right breast and nipple: Secondary | ICD-10-CM | POA: Insufficient documentation

## 2020-01-15 DIAGNOSIS — G4700 Insomnia, unspecified: Secondary | ICD-10-CM | POA: Insufficient documentation

## 2020-01-15 NOTE — Progress Notes (Signed)
Patient stated that she had been doing well with no complaints. 

## 2020-01-17 NOTE — Progress Notes (Signed)
Hematology/Oncology Consult note [facility]  Telephone:([phone] [phone]  Patient Care Team: Idelle Crouch, MD as PCP - General (Internal Medicine)   Name of the patient: Heidi [name]  [id number]  18-Apr-1941   Date of visit: 01/17/20  Diagnosis- stage II left breast cancer ER/PR positive and HER-2/neu negative   Chief complaint/ Reason for visit-routine follow-up of breast cancer  Heme/Onc history:  Oncology History Overview Note  # March 2016- LEFT BREAST CANCER STAGE II ER- 90%; PR-90%; Her 2 neu- NEG; NEO-ADJ CHEMO-  AC x4- Taxol x12 w;;s/p LUMPEC & SLNBx [ypT1 [1.5cm]; ypsN 3/6 (2 macro & 1 micro met)]  s/p RT [Nov 1st week;finish] NOV 2016- START FEMARA stop Feb 2017; April 2017- start Arimidex  # Right Breast cancer ? Stage;  s/p Mastec [1990;s/p Chemo; RT ]  # PN G-2 on neurontin; Hx PE on eliquis  # Mediport   Cancer of overlapping sites of left female breast ([place])  06/09/2016 Initial Diagnosis   Cancer of overlapping sites of left female breast ([place])      Interval history-tolerating Arimidex calcium vitamin D and Fosamax well without any significant side effects.  Neuropathy is well controlled with Cymbalta.  ECOG PS- 1 Pain scale- 0   Review of systems- Review of Systems  Constitutional: Positive for malaise/fatigue. Negative for chills, fever and weight loss.  HENT: Negative for congestion, ear discharge and nosebleeds.   Eyes: Negative for blurred vision.  Respiratory: Negative for cough, hemoptysis, sputum production, shortness of breath and wheezing.   Cardiovascular: Negative for chest pain, palpitations, orthopnea and claudication.  Gastrointestinal: Negative for abdominal pain, blood in stool, constipation, diarrhea, heartburn, melena, nausea and vomiting.  Genitourinary: Negative for dysuria, flank pain, frequency, hematuria and urgency.  Musculoskeletal: Negative for back pain, joint pain and myalgias.    Skin: Negative for rash.  Neurological: Negative for dizziness, tingling, focal weakness, seizures, weakness and headaches.  Endo/Heme/Allergies: Does not bruise/bleed easily.  Psychiatric/Behavioral: Negative for depression and suicidal ideas. The patient does not have insomnia.       Allergies  Allergen Reactions  . Other Anaphylaxis    Uncoded Allergy. Allergen: HYDROCHLORLIC ACID- causes water blisters  . Diphenhydramine Other (See Comments)    pt states makes legs very restless and ache  . Hydrochloric Acid Hives    Other reaction(s): Blisters Uncoded Allergy. Allergen: HYDROCHLORLIC ACID.     Past Medical History:  Diagnosis Date  . Anemia   . Anxiety   . Breast cancer ([place]) 1990   RT MASTECTOMY  . Breast cancer ([place]) 2015   LT LUMPECTOMY  . C. difficile diarrhea    see dr Vira Agar and this is her second course of vancomycin  . Cataracts, bilateral   . COPD (chronic obstructive pulmonary disease) (H[facility])    mild COPD  . Depression   . Dyspnea    with exersion  . Hepatitis 1960's   does not know which type  . Hyperlipidemia   . Hypertension   . Insomnia   . Neuropathy due to chemotherapeutic drug ([place])   . Osteoarthritis   . Osteoporosis   . Personal history of radiation therapy 2016   LEFT lumpectomy  . UTI (lower urinary tract infection)      Past Surgical History:  Procedure Laterality Date  . APPENDECTOMY  1970's  . BREAST BIOPSY Left 05/07/2015   Procedure: BREAST BIOPSY WITH NEEDLE LOCALIZATION;  Surgeon: Dia Crawford III, MD;  Location: [facility] ORS;  Service: General;  Laterality: Left;  . BREAST LUMPECTOMY Left 2016  . BREAST LUMPECTOMY WITH SENTINEL LYMPH NODE BIOPSY Left 05/07/2015   Procedure: PARTIAL MASTECTOMY WITH SENTINEL LYMPH NODE BX;  Surgeon: Dia Crawford III, MD;  Location: [facility] ORS;  Service: General;  Laterality: Left;  . BREAST SURGERY Right 1990   mastectomy  . EYE SURGERY Bilateral    cataract extractions  . MASTECTOMY Right    1990's  .  PORT-A-CATH REMOVAL Right 03/07/2017   Procedure: REMOVAL PORT-A-CATH;  Surgeon: Vickie Epley, MD;  Location: [facility] ORS;  Service: General;  Laterality: Right;    Social History   Socioeconomic History  . Marital status: Divorced    Spouse name: Not on file  . Number of children: Not on file  . Years of education: Not on file  . Highest education level: Not on file  Occupational History  . Not on file  Tobacco Use  . Smoking status: Former Smoker    Packs/day: 2.00    Years: 25.00    Pack years: 50.00    Types: Cigarettes    Quit date: 04/28/1992    Years since quitting: 27.7  . Smokeless tobacco: Never Used  . Tobacco comment: Pt quit 1995  Substance and Sexual Activity  . Alcohol use: No    Alcohol/week: 0.0 standard drinks  . Drug use: No  . Sexual activity: Never  Other Topics Concern  . Not on file  Social History Narrative  . Not on file   Social Determinants of Health   Financial Resource Strain:   . Difficulty of Paying Living Expenses:   Food Insecurity:   . Worried About Charity fundraiser in the Last Year:   . Arboriculturist in the Last Year:   Transportation Needs:   . Film/video editor (Medical):   Marland Kitchen Lack of Transportation (Non-Medical):   Physical Activity:   . Days of Exercise per Week:   . Minutes of Exercise per Session:   Stress:   . Feeling of Stress :   Social Connections:   . Frequency of Communication with Friends and Family:   . Frequency of Social Gatherings with Friends and Family:   . Attends Religious Services:   . Active Member of Clubs or Organizations:   . Attends Archivist Meetings:   Marland Kitchen Marital Status:   Intimate Partner Violence:   . Fear of Current or Ex-Partner:   . Emotionally Abused:   Marland Kitchen Physically Abused:   . Sexually Abused:     Family History  Problem Relation Age of Onset  . Dementia Mother   . Bladder Cancer Father   . Diabetes Sister   . Breast cancer Neg Hx      Current Outpatient  Medications:  .  alendronate (FOSAMAX) 70 MG tablet, TAKE 1 TABLET BY MOUTH ONCE WEEKLY ON AN EMPTY STOMACH BEFORE BREAKFAST. REMAIN UPRIGHT FOR 30 MINUTES & TAKE WITH 8 OUNCES OF WATER, Disp: 12 tablet, Rfl: 1 .  anastrozole (ARIMIDEX) 1 MG tablet, TAKE ONE TABLET BY MOUTH DAILY, Disp: 90 tablet, Rfl: 2 .  BucAlfAspKGlucCouchParsUvaUrJu (WATER PILLS PO), Take 1-2 tablets by mouth daily as needed (for fluid retention)., Disp: , Rfl:  .  busPIRone (BUSPAR) 7.5 MG tablet, Take 15 mg by mouth 2 (two) times daily. , Disp: , Rfl:  .  Calcium Carb-Cholecalciferol (CALCIUM 600 + D PO), Take 1 tablet by mouth 2 (two) times a day., Disp: , Rfl:  .  DULoxetine (CYMBALTA) 60 MG capsule, TAKE ONE CAPSULE BY MOUTH DAILY, Disp: 90 capsule, Rfl: 0 .  losartan (COZAAR) 100 MG tablet, Take 100 mg by mouth daily., Disp: , Rfl:  .  Multiple Vitamin (MULTIVITAMIN WITH MINERALS) TABS tablet, Take 1 tablet by mouth at bedtime. Senior Multivitamin, Disp: , Rfl:  .  Red Yeast Rice 600 MG CAPS, Take 2 capsules by mouth daily., Disp: , Rfl:  .  tizanidine (ZANAFLEX) 2 MG capsule, Take 2 mg by mouth at bedtime as needed for muscle spasms (2- 4 mg as needed at night)., Disp: , Rfl:  .  traZODone (DESYREL) 150 MG tablet, Take 150 mg by mouth at bedtime., Disp: , Rfl:  .  Turmeric 400 MG CAPS, Take 1 capsule by mouth 2 (two) times daily., Disp: , Rfl:  No current facility-administered medications for this visit.  Facility-Administered Medications Ordered in Other Visits:  .  sodium chloride 0.9 % injection 10 mL, 10 mL, Intravenous, PRN, Ma Hillock, Sandeep, MD .  sodium chloride 0.9 % injection 10 mL, 10 mL, Intravenous, PRN, Leia Alf, MD, 10 mL at 06/23/15 1236 .  sodium chloride flush (NS) 0.9 % injection 10 mL, 10 mL, Intravenous, PRN, Cammie Sickle, MD, 10 mL at 01/28/16 1421  Physical exam:  Vitals:   01/15/20 1055  BP: (!) 145/72  Pulse: 73  Temp: 97.8 F (36.6 C)  TempSrc: Tympanic  Weight: 222 lb  (100.7 kg)  Height: '5\' 5"'  (1.651 m)   Physical Exam Cardiovascular:     Rate and Rhythm: Normal rate and regular rhythm.     Heart sounds: Normal heart sounds.  Pulmonary:     Effort: Pulmonary effort is normal.     Breath sounds: Normal breath sounds.  Abdominal:     General: Bowel sounds are normal.     Palpations: Abdomen is soft.  Skin:    General: Skin is warm and dry.  Neurological:     Mental Status: She is alert and oriented to person, place, and time.   Patient is s/p left mastectomy without reconstruction and a well-healed surgical scar.  No evidence of chest wall recurrence.  No palpable masses in the right breast.  No palpable bilateral axillary adenopathy.  CMP Latest Ref Rng & Units 07/14/2019  Glucose 70 - 99 mg/dL 81  BUN 8 - 23 mg/dL 19  Creatinine 0.44 - 1.00 mg/dL 0.98  Sodium 135 - 145 mmol/L 142  Potassium 3.5 - 5.1 mmol/L 5.0  Chloride 98 - 111 mmol/L 108  CO2 22 - 32 mmol/L 24  Calcium 8.9 - 10.3 mg/dL 9.1  Total Protein 6.5 - 8.1 g/dL 7.9  Total Bilirubin 0.3 - 1.2 mg/dL 0.8  Alkaline Phos 38 - 126 U/L 76  AST 15 - 41 U/L 26  ALT 0 - 44 U/L 24   CBC Latest Ref Rng & Units 07/14/2019  WBC 4.0 - 10.5 K/uL 7.6  Hemoglobin 12.0 - 15.0 g/dL 13.7  Hematocrit 36.0 - 46.0 % 41.5  Platelets 150 - 400 K/uL 245      Assessment and plan- Patient is a 79 y.o. female with history of stage II left breast cancer ER/PR positive and HER-2 negative in 2016 here for routine follow-up  Clinically patient is doing well with no concerning signs and symptoms of recurrence based on today's exam.  She will need a repeat screening mammogram of the left side in August 2021 which I will schedule.    Patient had a bone density  scan in August 2020 which wasOverall stable as compared to a bone density scan in 2018.  Given that she had high risk breast cancer requiring chemotherapy it may be worthwhile to continue hormone therapy for 10 years after patient is able to tolerate it  well and there is no significant worsening of her osteoporosis.  Chemo-induced peripheral neuropathy: Continue Cymbalta.  Symptoms are overall stable  I will see her back in 6 months time   Visit Diagnosis 1. Encounter for follow-up surveillance of breast cancer   2. Malignant neoplasm of overlapping sites of left breast in female, estrogen receptor positive ([place])   3. Chemotherapy-induced peripheral neuropathy ([place])   4. Osteoporosis without current pathological fracture, unspecified osteoporosis type      Dr. Randa Evens, MD, MPH [facility] at [facility] [phone] 01/17/2020 6:17 PM

## 2020-01-25 ENCOUNTER — Other Ambulatory Visit: Payer: Self-pay | Admitting: Oncology

## 2020-04-04 ENCOUNTER — Other Ambulatory Visit: Payer: Self-pay | Admitting: Oncology

## 2020-04-04 DIAGNOSIS — Z170 Estrogen receptor positive status [ER+]: Secondary | ICD-10-CM

## 2020-04-21 ENCOUNTER — Other Ambulatory Visit: Payer: Self-pay | Admitting: Oncology

## 2020-06-17 ENCOUNTER — Ambulatory Visit
Admission: RE | Admit: 2020-06-17 | Discharge: 2020-06-17 | Disposition: A | Discharge status: Home / Self Care | Payer: Medicare Other | Source: Ambulatory Visit | Attending: Oncology | Admitting: Oncology

## 2020-06-17 ENCOUNTER — Other Ambulatory Visit: Payer: Self-pay

## 2020-06-17 DIAGNOSIS — Z170 Estrogen receptor positive status [ER+]: Secondary | ICD-10-CM

## 2020-06-17 DIAGNOSIS — C50812 Malignant neoplasm of overlapping sites of left female breast: Secondary | ICD-10-CM | POA: Insufficient documentation

## 2020-06-17 DIAGNOSIS — Z853 Personal history of malignant neoplasm of breast: Secondary | ICD-10-CM | POA: Insufficient documentation

## 2020-06-17 DIAGNOSIS — Z08 Encounter for follow-up examination after completed treatment for malignant neoplasm: Secondary | ICD-10-CM | POA: Diagnosis not present

## 2020-06-18 ENCOUNTER — Other Ambulatory Visit: Payer: Self-pay | Admitting: *Deleted

## 2020-06-18 MED ORDER — TIZANIDINE HCL 2 MG PO CAPS: 2.0000 mg | ORAL_CAPSULE | Freq: Every evening | ORAL | 0 refills | Status: DC | PRN

## 2020-07-19 ENCOUNTER — Inpatient Hospital Stay: Payer: Medicare Other | Attending: Oncology | Admitting: Oncology

## 2020-07-19 ENCOUNTER — Other Ambulatory Visit: Payer: Self-pay

## 2020-07-19 ENCOUNTER — Encounter: Payer: Self-pay | Admitting: Oncology

## 2020-07-19 ENCOUNTER — Other Ambulatory Visit: Payer: Self-pay | Admitting: *Deleted

## 2020-07-19 VITALS — BP 127/66 | HR 92 | Temp 98.2°F | Resp 16 | Ht 64.0 in | Wt 231.0 lb

## 2020-07-19 DIAGNOSIS — Z923 Personal history of irradiation: Secondary | ICD-10-CM | POA: Insufficient documentation

## 2020-07-19 DIAGNOSIS — Z9011 Acquired absence of right breast and nipple: Secondary | ICD-10-CM | POA: Insufficient documentation

## 2020-07-19 DIAGNOSIS — F418 Other specified anxiety disorders: Secondary | ICD-10-CM | POA: Insufficient documentation

## 2020-07-19 DIAGNOSIS — E785 Hyperlipidemia, unspecified: Secondary | ICD-10-CM | POA: Insufficient documentation

## 2020-07-19 DIAGNOSIS — Z87891 Personal history of nicotine dependence: Secondary | ICD-10-CM | POA: Insufficient documentation

## 2020-07-19 DIAGNOSIS — Z86711 Personal history of pulmonary embolism: Secondary | ICD-10-CM | POA: Insufficient documentation

## 2020-07-19 DIAGNOSIS — G620 Drug-induced polyneuropathy: Secondary | ICD-10-CM | POA: Insufficient documentation

## 2020-07-19 DIAGNOSIS — Z853 Personal history of malignant neoplasm of breast: Secondary | ICD-10-CM | POA: Diagnosis not present

## 2020-07-19 DIAGNOSIS — Z79899 Other long term (current) drug therapy: Secondary | ICD-10-CM | POA: Diagnosis not present

## 2020-07-19 DIAGNOSIS — Z08 Encounter for follow-up examination after completed treatment for malignant neoplasm: Secondary | ICD-10-CM | POA: Diagnosis not present

## 2020-07-19 DIAGNOSIS — C50812 Malignant neoplasm of overlapping sites of left female breast: Secondary | ICD-10-CM | POA: Diagnosis present

## 2020-07-19 DIAGNOSIS — M1990 Unspecified osteoarthritis, unspecified site: Secondary | ICD-10-CM | POA: Diagnosis not present

## 2020-07-19 DIAGNOSIS — J449 Chronic obstructive pulmonary disease, unspecified: Secondary | ICD-10-CM | POA: Diagnosis not present

## 2020-07-19 DIAGNOSIS — Z170 Estrogen receptor positive status [ER+]: Secondary | ICD-10-CM | POA: Insufficient documentation

## 2020-07-19 DIAGNOSIS — Z5181 Encounter for therapeutic drug level monitoring: Secondary | ICD-10-CM | POA: Diagnosis not present

## 2020-07-19 DIAGNOSIS — Z79811 Long term (current) use of aromatase inhibitors: Secondary | ICD-10-CM

## 2020-07-19 DIAGNOSIS — G8929 Other chronic pain: Secondary | ICD-10-CM | POA: Insufficient documentation

## 2020-07-19 DIAGNOSIS — I10 Essential (primary) hypertension: Secondary | ICD-10-CM | POA: Diagnosis not present

## 2020-07-19 DIAGNOSIS — M545 Low back pain: Secondary | ICD-10-CM | POA: Insufficient documentation

## 2020-07-19 DIAGNOSIS — T451X5A Adverse effect of antineoplastic and immunosuppressive drugs, initial encounter: Secondary | ICD-10-CM | POA: Insufficient documentation

## 2020-07-19 DIAGNOSIS — Z7901 Long term (current) use of anticoagulants: Secondary | ICD-10-CM | POA: Diagnosis not present

## 2020-07-19 MED ORDER — TIZANIDINE HCL 2 MG PO CAPS: 2.0000 mg | ORAL_CAPSULE | Freq: Every evening | ORAL | 0 refills | Status: DC | PRN

## 2020-07-19 MED ORDER — DULOXETINE HCL 60 MG PO CPEP: 60.0000 mg | ORAL_CAPSULE | Freq: Every day | ORAL | 0 refills | Status: DC

## 2020-07-19 NOTE — Progress Notes (Signed)
Pt having some depression due to staying at home during covid and her back pain exercises at [facility] senior care was cancelled and just now opened back up and she is going today at 1 pm. She has lost her PCP- she followed Jasmin singh and she is no longer working at [employer]. She asked her meds to be refilled and the did everything but her muscle relaxer. When she called the following day that was when she was told that Dr. Candiss Norse no longer works there. She called her insurance and they gave her 3 doctors in network and when she called them they are not accepting new pt's. She is still looking for PCP.

## 2020-07-19 NOTE — Progress Notes (Signed)
Hematology/Oncology Consult note [facility]  Telephone:([phone] [phone]  Patient Care Team: Idelle Crouch, MD as PCP - General (Internal Medicine)   Name of the patient: Heidi [name]  [id number]  November 21, 1940   Date of visit: 07/19/20  Diagnosis-  stage II left breast cancer ER/PR positive and HER-2/neu negative  Chief complaint/ Reason for visit-routine follow-up of breast cancer  Heme/Onc history:  Oncology History Overview Note  # March 2016- LEFT BREAST CANCER STAGE II ER- 90%; PR-90%; Her 2 neu- NEG; NEO-ADJ CHEMO-  AC x4- Taxol x12 w;;s/p LUMPEC & SLNBx [ypT1 [1.5cm]; ypsN 3/6 (2 macro & 1 micro met)]  s/p RT [Nov 1st week;finish] NOV 2016- START FEMARA stop Feb 2017; April 2017- start Arimidex  # Right Breast cancer ? Stage;  s/p Mastec [1990;s/p Chemo; RT ]  # PN G-2 on neurontin; Hx PE on eliquis  # Mediport   Cancer of overlapping sites of left female breast ([place])  06/09/2016 Initial Diagnosis   Cancer of overlapping sites of left female breast ([place])      Interval history-has chronic back pain for which she takes Zanaflex as needed.  She is in the process of finding a new primary care doctor who can refill her chronic medications.  Otherwise tolerating Arimidex along with calcium vitamin D and Fosamax well.  On Cymbalta for neuropathy.  ECOG PS- 2 Pain scale- 0  Review of systems- Review of Systems  Constitutional: Negative for chills, fever, malaise/fatigue and weight loss.  HENT: Negative for congestion, ear discharge and nosebleeds.   Eyes: Negative for blurred vision.  Respiratory: Negative for cough, hemoptysis, sputum production, shortness of breath and wheezing.   Cardiovascular: Negative for chest pain, palpitations, orthopnea and claudication.  Gastrointestinal: Negative for abdominal pain, blood in stool, constipation, diarrhea, heartburn, melena, nausea and vomiting.  Genitourinary: Negative for dysuria,  flank pain, frequency, hematuria and urgency.  Musculoskeletal: Negative for back pain, joint pain and myalgias.  Skin: Negative for rash.  Neurological: Negative for dizziness, tingling, focal weakness, seizures, weakness and headaches.  Endo/Heme/Allergies: Does not bruise/bleed easily.  Psychiatric/Behavioral: Negative for depression and suicidal ideas. The patient does not have insomnia.       Allergies  Allergen Reactions   Other Anaphylaxis    Uncoded Allergy. Allergen: HYDROCHLORLIC ACID- causes water blisters   Diphenhydramine Other (See Comments)    pt states makes legs very restless and ache   Hydrochloric Acid Hives    Other reaction(s): Blisters Uncoded Allergy. Allergen: HYDROCHLORLIC ACID.     Past Medical History:  Diagnosis Date   Anemia    Anxiety    Breast cancer ([place]) 1990   RT MASTECTOMY   Breast cancer ([place]) 2015   LT LUMPECTOMY   C. difficile diarrhea    see dr Vira Agar and this is her second course of vancomycin   Cataracts, bilateral    COPD (chronic obstructive pulmonary disease) ([place])    mild COPD   Depression    Dyspnea    with exersion   Hepatitis 1960's   does not know which type   Hyperlipidemia    Hypertension    Insomnia    Neuropathy due to chemotherapeutic drug ([place])    Osteoarthritis    Osteoporosis    Personal history of radiation therapy 2016   LEFT lumpectomy   UTI (lower urinary tract infection)      Past Surgical History:  Procedure Laterality Date   APPENDECTOMY  1970's  BREAST BIOPSY Left 05/07/2015   Procedure: BREAST BIOPSY WITH NEEDLE LOCALIZATION;  Surgeon: Dia Crawford III, MD;  Location: [facility] ORS;  Service: General;  Laterality: Left;   BREAST LUMPECTOMY Left 2016   BREAST LUMPECTOMY WITH SENTINEL LYMPH NODE BIOPSY Left 05/07/2015   Procedure: PARTIAL MASTECTOMY WITH SENTINEL LYMPH NODE BX;  Surgeon: Dia Crawford III, MD;  Location: [facility] ORS;  Service: General;  Laterality: Left;   BREAST  SURGERY Right 1990   mastectomy   EYE SURGERY Bilateral    cataract extractions   MASTECTOMY Right    1990's   PORT-A-CATH REMOVAL Right 03/07/2017   Procedure: REMOVAL PORT-A-CATH;  Surgeon: Vickie Epley, MD;  Location: [facility] ORS;  Service: General;  Laterality: Right;    Social History   Socioeconomic History   Marital status: Divorced    Spouse name: Not on file   Number of children: Not on file   Years of education: Not on file   Highest education level: Not on file  Occupational History   Not on file  Tobacco Use   Smoking status: Former Smoker    Packs/day: 2.00    Years: 25.00    Pack years: 50.00    Types: Cigarettes    Quit date: 04/28/1992    Years since quitting: 28.2   Smokeless tobacco: Never Used   Tobacco comment: Pt quit 1995  Vaping Use   Vaping Use: Never used  Substance and Sexual Activity   Alcohol use: No    Alcohol/week: 0.0 standard drinks   Drug use: No   Sexual activity: Never  Other Topics Concern   Not on file  Social History Narrative   Not on file   Social Determinants of Health   Financial Resource Strain:    Difficulty of Paying Living Expenses: Not on file  Food Insecurity:    Worried About [place] in the Last Year: Not on file   [employer] of Food in the Last Year: Not on file  Transportation Needs:    Lack of Transportation (Medical): Not on file   Lack of Transportation (Non-Medical): Not on file  Physical Activity:    Days of Exercise per Week: Not on file   Minutes of Exercise per Session: Not on file  Stress:    Feeling of Stress : Not on file  Social Connections:    Frequency of Communication with Friends and Family: Not on file   Frequency of Social Gatherings with Friends and Family: Not on file   Attends Religious Services: Not on file   Active Member of Clubs or Organizations: Not on file   Attends Archivist Meetings: Not on file   Marital Status: Not on file    Intimate Partner Violence:    Fear of Current or Ex-Partner: Not on file   Emotionally Abused: Not on file   Physically Abused: Not on file   Sexually Abused: Not on file    Family History  Problem Relation Age of Onset   Dementia Mother    Bladder Cancer Father    Diabetes Sister    Breast cancer Neg Hx      Current Outpatient Medications:    alendronate (FOSAMAX) 70 MG tablet, TAKE 1 TABLET BY MOUTH ONCE WEEKLY ON AN EMPTY STOMACH BEFORE BREAKFAST. REMAIN UPRIGHT FOR 30 MINUTES & TAKE WITH 8 OUNCES OF WATER, Disp: 12 tablet, Rfl: 0   anastrozole (ARIMIDEX) 1 MG tablet, TAKE ONE TABLET BY MOUTH DAILY, Disp: 90 tablet,  Rfl: 1   BucAlfAspKGlucCouchParsUvaUrJu (WATER PILLS PO), Take 1-2 tablets by mouth daily as needed (for fluid retention)., Disp: , Rfl:    busPIRone (BUSPAR) 7.5 MG tablet, Take 15 mg by mouth 2 (two) times daily. , Disp: , Rfl:    Calcium Carb-Cholecalciferol (CALCIUM 600 + D PO), Take 1 tablet by mouth 2 (two) times a day., Disp: , Rfl:    DULoxetine (CYMBALTA) 60 MG capsule, Take 1 capsule (60 mg total) by mouth daily., Disp: 90 capsule, Rfl: 0   losartan (COZAAR) 100 MG tablet, Take 100 mg by mouth daily., Disp: , Rfl:    Multiple Vitamin (MULTIVITAMIN WITH MINERALS) TABS tablet, Take 1 tablet by mouth at bedtime. Senior Multivitamin, Disp: , Rfl:    NON FORMULARY, Take 2 capsules by mouth daily. Dynamic brain, Disp: , Rfl:    Red Yeast Rice 600 MG CAPS, Take 2 capsules by mouth daily., Disp: , Rfl:    traZODone (DESYREL) 150 MG tablet, Take 150 mg by mouth at bedtime., Disp: , Rfl:    Turmeric 400 MG CAPS, Take 1 capsule by mouth 2 (two) times daily., Disp: , Rfl:    tizanidine (ZANAFLEX) 2 MG capsule, Take 1 capsule (2 mg total) by mouth at bedtime as needed for muscle spasms (2- 4 mg as needed at night)., Disp: 30 capsule, Rfl: 0 No current facility-administered medications for this visit.  Facility-Administered Medications Ordered in  Other Visits:    sodium chloride 0.9 % injection 10 mL, 10 mL, Intravenous, PRN, Ma Hillock, Sandeep, MD   sodium chloride 0.9 % injection 10 mL, 10 mL, Intravenous, PRN, Leia Alf, MD, 10 mL at 06/23/15 1236   sodium chloride flush (NS) 0.9 % injection 10 mL, 10 mL, Intravenous, PRN, Cammie Sickle, MD, 10 mL at 01/28/16 1421  Physical exam:  Vitals:   07/19/20 1035  BP: 127/66  Pulse: 92  Resp: 16  Temp: 98.2 F (36.8 C)  TempSrc: Oral  Weight: 231 lb (104.8 kg)  Height: '5\' 4"'  (1.626 m)   Physical Exam Constitutional:      Comments: Ambulates with a cane appears in no acute distress  Cardiovascular:     Rate and Rhythm: Normal rate and regular rhythm.     Heart sounds: Normal heart sounds.  Pulmonary:     Effort: Pulmonary effort is normal.     Breath sounds: Normal breath sounds.  Abdominal:     General: Bowel sounds are normal.     Palpations: Abdomen is soft.  Skin:    General: Skin is warm and dry.  Neurological:     Mental Status: She is alert and oriented to person, place, and time.   Breast exam: Patient is s/p right mastectomy without reconstruction.  No evidence of chest wall recurrence.  No palpable masses in the left breast.  No palpable left axillary adenopathy.  CMP Latest Ref Rng & Units 07/14/2019  Glucose 70 - 99 mg/dL 81  BUN 8 - 23 mg/dL 19  Creatinine 0.44 - 1.00 mg/dL 0.98  Sodium 135 - 145 mmol/L 142  Potassium 3.5 - 5.1 mmol/L 5.0  Chloride 98 - 111 mmol/L 108  CO2 22 - 32 mmol/L 24  Calcium 8.9 - 10.3 mg/dL 9.1  Total Protein 6.5 - 8.1 g/dL 7.9  Total Bilirubin 0.3 - 1.2 mg/dL 0.8  Alkaline Phos 38 - 126 U/L 76  AST 15 - 41 U/L 26  ALT 0 - 44 U/L 24   CBC Latest Ref Rng &  Units 07/14/2019  WBC 4.0 - 10.5 K/uL 7.6  Hemoglobin 12.0 - 15.0 g/dL 13.7  Hematocrit 36 - 46 % 41.5  Platelets 150 - 400 K/uL 245      Assessment and plan- Patient is a 79 y.o. female with history of stage II left breast cancer ER/PR positive and HER-2  negative in 2016.  This is a routine follow-up visit for breast cancer  From a breast cancer standpoint patient is doing well with no concerning signs and symptoms of recurrence based on today's exam.  Patient will be finishing 5 years of endocrine therapy this year in November.  Since patient has baseline osteoporosis and she is 79 years of age have asked her to hold off on taking any further hormone therapy for 5 more years.  I will see her back in 6 months for breast exam and following that I will see her  on a yearly basis.  Her recent mammogram in August 2021 was within normal limits  Low back pain: Patient has been taking Zanaflex at bedtime as needed and this was prescribed by her primary care doctor.  Patient is in the process of finding a new primary care doctor and I have agreed to refill it for 1 month.  I explained to the patient that she will need to find a new primary care doctor to refill her chronic medications.  Patient verbalized understanding  Chemo-induced peripheral neuropathy: Continue Cymbalta   Visit Diagnosis 1. Visit for monitoring Arimidex therapy   2. Encounter for follow-up surveillance of breast cancer   3. Chemotherapy-induced peripheral neuropathy ([place])      Dr. Randa Evens, MD, MPH [facility] at [facility] [phone] 07/19/2020 2:14 PM

## 2020-07-20 ENCOUNTER — Other Ambulatory Visit: Payer: Self-pay | Admitting: Oncology

## 2020-08-12 ENCOUNTER — Encounter: Payer: Self-pay | Admitting: Oncology

## 2020-08-16 ENCOUNTER — Other Ambulatory Visit: Payer: Self-pay | Admitting: *Deleted

## 2020-08-16 MED ORDER — LOSARTAN POTASSIUM 100 MG PO TABS: 100.0000 mg | ORAL_TABLET | Freq: Every day | ORAL | 0 refills | Status: DC

## 2020-08-16 MED ORDER — TIZANIDINE HCL 2 MG PO CAPS: 2.0000 mg | ORAL_CAPSULE | Freq: Every evening | ORAL | 0 refills | Status: DC | PRN

## 2020-08-16 MED ORDER — BUSPIRONE HCL 7.5 MG PO TABS: 15.0000 mg | ORAL_TABLET | Freq: Two times a day (BID) | ORAL | 0 refills | Status: DC

## 2020-09-16 ENCOUNTER — Other Ambulatory Visit: Payer: Self-pay | Admitting: Oncology

## 2020-09-21 ENCOUNTER — Ambulatory Visit (INDEPENDENT_AMBULATORY_CARE_PROVIDER_SITE_OTHER): Payer: Medicare Other | Admitting: Family Medicine

## 2020-09-21 ENCOUNTER — Encounter: Payer: Self-pay | Admitting: Family Medicine

## 2020-09-21 ENCOUNTER — Other Ambulatory Visit: Payer: Self-pay

## 2020-09-21 VITALS — BP 141/81 | HR 97 | Temp 98.6°F | Resp 16 | Ht 64.0 in | Wt 219.6 lb

## 2020-09-21 DIAGNOSIS — I10 Essential (primary) hypertension: Secondary | ICD-10-CM

## 2020-09-21 DIAGNOSIS — M47816 Spondylosis without myelopathy or radiculopathy, lumbar region: Secondary | ICD-10-CM

## 2020-09-21 DIAGNOSIS — G8929 Other chronic pain: Secondary | ICD-10-CM

## 2020-09-21 DIAGNOSIS — M545 Low back pain, unspecified: Secondary | ICD-10-CM

## 2020-09-21 DIAGNOSIS — J321 Chronic frontal sinusitis: Secondary | ICD-10-CM

## 2020-09-21 DIAGNOSIS — Z853 Personal history of malignant neoplasm of breast: Secondary | ICD-10-CM

## 2020-09-21 DIAGNOSIS — E669 Obesity, unspecified: Secondary | ICD-10-CM

## 2020-09-21 DIAGNOSIS — Z7689 Persons encountering health services in other specified circumstances: Secondary | ICD-10-CM

## 2020-09-21 DIAGNOSIS — F411 Generalized anxiety disorder: Secondary | ICD-10-CM

## 2020-09-21 DIAGNOSIS — Z23 Encounter for immunization: Secondary | ICD-10-CM

## 2020-09-21 DIAGNOSIS — F330 Major depressive disorder, recurrent, mild: Secondary | ICD-10-CM

## 2020-09-21 DIAGNOSIS — G620 Drug-induced polyneuropathy: Secondary | ICD-10-CM

## 2020-09-21 DIAGNOSIS — F5101 Primary insomnia: Secondary | ICD-10-CM

## 2020-09-21 DIAGNOSIS — R7303 Prediabetes: Secondary | ICD-10-CM

## 2020-09-21 MED ORDER — BUSPIRONE HCL 30 MG PO TABS: 30.0000 mg | ORAL_TABLET | Freq: Every day | ORAL | 1 refills | Status: DC

## 2020-09-21 MED ORDER — LOSARTAN POTASSIUM 100 MG PO TABS: 100.0000 mg | ORAL_TABLET | Freq: Every day | ORAL | 1 refills | Status: DC

## 2020-09-21 MED ORDER — TRAZODONE HCL 100 MG PO TABS: 200.0000 mg | ORAL_TABLET | Freq: Every evening | ORAL | 1 refills | Status: DC | PRN

## 2020-09-21 MED ORDER — TIZANIDINE HCL 2 MG PO CAPS: 2.0000 mg | ORAL_CAPSULE | Freq: Two times a day (BID) | ORAL | 1 refills | Status: DC | PRN

## 2020-09-21 MED ORDER — AMOXICILLIN-POT CLAVULANATE 875-125 MG PO TABS: 1.0000 | ORAL_TABLET | Freq: Two times a day (BID) | ORAL | 0 refills | Status: DC

## 2020-09-21 NOTE — Patient Instructions (Addendum)
Thank you for coming to the office today.  Flu shot today  Med list with new instructions.  Caution with Afrin nasal spray - check your nasal spray, if it has Oxymetazolone - this can cause rebound effect, need to wean off of it.  Future if need we can try Azelastine allergy nasal  Max dose sleeping pill Trazodone is 3 pills for 300mg   Recommend to start taking Tylenol Extra Strength 500mg  tabs - take 1 to 2 tabs per dose (max 1000mg ) every 6-8 hours for pain (take regularly, don't skip a dose for next 7 days), max 24 hour daily dose is 6 tablets or 3000mg . In the future you can repeat the same everyday Tylenol course for 1-2 weeks at a time.   Prefer to limit Ibuprofen.  Please schedule a Follow-up Appointment to: Return in about 3 months (around 12/22/2020) for 3 month follow-up Back Pain, Mood/Anxiety PHQ, Insomnia, HTN.  If you have any other questions or concerns, please feel free to call the office or send a message through [place]. You may also schedule an earlier appointment if necessary.  Additionally, you may be receiving a survey about your experience at our office within a few days to 1 week by e-mail or mail. We value your feedback.  Nobie Putnam, DO [place]

## 2020-09-21 NOTE — Progress Notes (Signed)
Subjective:    Patient ID: Heidi [name], female    DOB: 05/25/1941, 79 y.o.   MRN: [id number]  Heidi [name] is a 79 y.o. female presenting on 09/21/2020 for Establish Care (refill,chronic sinusitis from past two years --dry mouth, chills )  Previous PCP - Dr Candiss Norse ([place]), they relocated to [facility], she followed and then ultimately that doctor is no longer practicing.  HPI   Oncology - Dr Randa Evens [facility])  Stage II Left breast cancer ER/PR positive and HER-2/neu negative Followed by Dr Janese Banks S/p R mastectomy 1990, and L lumpectomy 2015, s/p radiation therapy Completed Armidex now, after 5 years hormone therapy. She will continue with q 6 month surveillance and then eventually mammogram next year   Neuropathy, secondary to chemotherapy Reports bilateral finger tip numbness, difficulty with dextrous activities and typing, still has some symptoms in lower extremity Previously taking Duloxetine 54m daily but limited relief for numbness, so has stopped Tried Gabapentin before, no relief.  Chronic Sinusitis History of recurrent issues seems >1+ years has chronic problem with dry mouth and sinuses some secondary drainage and drip on Left side. She has seen prior PCP for this same issue. She now feels this is worse to the point of pain and pressure concern for sinusitis infection, she would like to take antibiotic, it has worked before with Augmentin. She has not seen ENT before, no imaging on sinuses recently. Tried various nose sprays saline and also flonase and other OTC limited relief. She denies taking Afrin.  CHRONIC HTN: Reports controlled Current Meds - Losartan 1064mdaily Reports good compliance, took meds today. Tolerating well, w/o complaints. Denies CP, dyspnea, HA, edema, dizziness / lightheadedness  Chronic Low Back Pain DJD Lumbar spine Back Spasms Back in 2016 had back injury, she had imaging with X-ray 2017 with DJD, she has been treated multiple times  for this and has taken meds, including now Tizanidine, takes 21m26mwice a day PRN with good relief, needs more pill count to take regularly. - Occasionally if more active, takes Ibuprofen 200m54m4 = 800mg45m with some good results, but now taking it less. - Stopped Tylenol since no relief with 2 pills - Taking Turmeric some relief  Recurrent Depression, mild chronic Generalized Anxiety Disorder Chronic problems, seem episodic, can be related to health and stress as well No longer on Duloxetine, did not help her mood Doing well on Buspar 7.5mg x79min am 30mg i821me dose, seems to last part of day and is very effective, she functions better on this, takes it daily. Prior doctor said it was okay. - For insomnia, takes Trazodone 100mg x 35m200mg nig28m for sleep, asks about higher dose  Also some memory difficulty at times, remembering names Now taking Memory Supplement with Choline ([facility] Diuretic OTC  Health Maintenance: Due for Flu Shot, will receive today    Depression screen PHQ 2/9 1[facility]3/2021 03/26/2018 03/15/2016  Decreased Interest 1 0 0  Down, Depressed, Hopeless 0 0 0  PHQ - 2 Score 1 0 0  Altered sleeping 3 - -  Tired, decreased energy 0 - -  Change in appetite 0 - -  Feeling bad or failure about yourself  0 - -  Trouble concentrating 1 - -  Moving slowly or fidgety/restless 0 - -  Suicidal thoughts 0 - -  PHQ-9 Score 5 - -  Difficult doing work/chores Somewhat difficult - -   GAD 7 :  Generalized Anxiety Score 09/21/2020  Nervous, Anxious, on Edge 2  Control/stop worrying 0  Worry too much - different things 1  Trouble relaxing 1  Restless 0  Easily annoyed or irritable 0  Afraid - awful might happen 0  Total GAD 7 Score 4  Anxiety Difficulty Somewhat difficult      Past Medical History:  Diagnosis Date  . Anemia   . Anxiety   . Breast cancer ([place]) 1990   RT MASTECTOMY  . C. difficile diarrhea    see dr Vira Agar and this is her  second course of vancomycin  . Cataracts, bilateral   . Depression   . Dyspnea    with exersion  . Hepatitis 1960's   does not know which type  . Hyperlipidemia   . Hypertension   . Insomnia   . Neuropathy due to chemotherapeutic drug ([place])   . Osteoarthritis   . Osteoporosis   . Personal history of radiation therapy 2016   LEFT lumpectomy   Past Surgical History:  Procedure Laterality Date  . APPENDECTOMY  1970's  . BREAST BIOPSY Left 05/07/2015   Procedure: BREAST BIOPSY WITH NEEDLE LOCALIZATION;  Surgeon: Dia Crawford III, MD;  Location: [facility] ORS;  Service: General;  Laterality: Left;  . BREAST LUMPECTOMY Left 2016  . BREAST LUMPECTOMY WITH SENTINEL LYMPH NODE BIOPSY Left 05/07/2015   Procedure: PARTIAL MASTECTOMY WITH SENTINEL LYMPH NODE BX;  Surgeon: Dia Crawford III, MD;  Location: [facility] ORS;  Service: General;  Laterality: Left;  . BREAST SURGERY Right 1990   mastectomy  . EYE SURGERY Bilateral    cataract extractions  . MASTECTOMY Right    1990's  . PORT-A-CATH REMOVAL Right 03/07/2017   Procedure: REMOVAL PORT-A-CATH;  Surgeon: Vickie Epley, MD;  Location: [facility] ORS;  Service: General;  Laterality: Right;   Social History   Socioeconomic History  . Marital status: Divorced    Spouse name: Not on file  . Number of children: Not on file  . Years of education: Not on file  . Highest education level: Not on file  Occupational History  . Not on file  Tobacco Use  . Smoking status: Former Smoker    Packs/day: 2.00    Years: 25.00    Pack years: 50.00    Types: Cigarettes    Quit date: 04/28/1992    Years since quitting: 28.4  . Smokeless tobacco: Former Systems developer  . Tobacco comment: Pt quit 1995  Vaping Use  . Vaping Use: Never used  Substance and Sexual Activity  . Alcohol use: No    Alcohol/week: 0.0 standard drinks  . Drug use: No  . Sexual activity: Never  Other Topics Concern  . Not on file  Social History Narrative  . Not on file   Social Determinants of  Health   Financial Resource Strain:   . Difficulty of Paying Living Expenses: Not on file  Food Insecurity:   . Worried About Charity fundraiser in the Last Year: Not on file  . Ran Out of Food in the Last Year: Not on file  Transportation Needs:   . Lack of Transportation (Medical): Not on file  . Lack of Transportation (Non-Medical): Not on file  Physical Activity:   . Days of Exercise per Week: Not on file  . Minutes of Exercise per Session: Not on file  Stress:   . Feeling of Stress : Not on file  Social Connections:   . Frequency of Communication with Friends and  Family: Not on file  . Frequency of Social Gatherings with Friends and Family: Not on file  . Attends Religious Services: Not on file  . Active Member of Clubs or Organizations: Not on file  . Attends Archivist Meetings: Not on file  . Marital Status: Not on file  Intimate Partner Violence:   . Fear of Current or Ex-Partner: Not on file  . Emotionally Abused: Not on file  . Physically Abused: Not on file  . Sexually Abused: Not on file   Family History  Problem Relation Age of Onset  . Dementia Mother   . Bladder Cancer Father   . Diabetes Sister   . Breast cancer Neg Hx    Current Outpatient Medications on File Prior to Visit  Medication Sig  . alendronate (FOSAMAX) 70 MG tablet TAKE 1 TABLET BY MOUTH ONCE WEEKLY ON AN EMPTY STOMACH BEFORE BREAKFAST. REMAIN UPRIGHT FOR 30 MINUTES & TAKE WITH 8 OUNCES OF WATER  . BucAlfAspKGlucCouchParsUvaUrJu (WATER PILLS PO) Take 1-2 tablets by mouth daily as needed (for fluid retention).  . Calcium Carb-Cholecalciferol (CALCIUM 600 + D PO) Take 1 tablet by mouth 2 (two) times a day.  . Multiple Vitamin (MULTIVITAMIN WITH MINERALS) TABS tablet Take 1 tablet by mouth at bedtime. Senior Multivitamin  . NON FORMULARY Take 2 capsules by mouth daily. Dynamic brain  . Red Yeast Rice 600 MG CAPS Take 2 capsules by mouth daily.  . Turmeric 400 MG CAPS Take 1 capsule by  mouth 2 (two) times daily.  . [DISCONTINUED] anastrozole (ARIMIDEX) 1 MG tablet Take 1 tablet (1 mg total) by mouth daily.   Current Facility-Administered Medications on File Prior to Visit  Medication  . sodium chloride 0.9 % injection 10 mL  . sodium chloride 0.9 % injection 10 mL  . sodium chloride flush (NS) 0.9 % injection 10 mL    Review of Systems Per HPI unless specifically indicated above      Objective:    BP (!) 141/81   Pulse 97   Temp 98.6 F (37 C) (Temporal)   Resp 16   Ht '5\' 4"'  (1.626 m)   Wt 219 lb 9.6 oz (99.6 kg)   SpO2 97%   BMI 37.69 kg/m   Wt Readings from Last 3 Encounters:  09/21/20 219 lb 9.6 oz (99.6 kg)  07/19/20 231 lb (104.8 kg)  01/15/20 222 lb (100.7 kg)    Physical Exam Vitals and nursing note reviewed.  Constitutional:      General: She is not in acute distress.    Appearance: She is well-developed. She is not diaphoretic.     Comments: Well-appearing, comfortable, cooperative  HENT:     Head: Normocephalic and atraumatic.  Eyes:     General:        Right eye: No discharge.        Left eye: No discharge.     Conjunctiva/sclera: Conjunctivae normal.  Neck:     Thyroid: No thyromegaly.  Cardiovascular:     Rate and Rhythm: Normal rate and regular rhythm.     Heart sounds: Normal heart sounds. No murmur heard.   Pulmonary:     Effort: Pulmonary effort is normal. No respiratory distress.     Breath sounds: Normal breath sounds. No wheezing or rales.  Musculoskeletal:        General: Normal range of motion.     Cervical back: Normal range of motion and neck supple.  Lymphadenopathy:     Cervical:  No cervical adenopathy.  Skin:    General: Skin is warm and dry.     Findings: No erythema or rash.  Neurological:     Mental Status: She is alert and oriented to person, place, and time.  Psychiatric:        Behavior: Behavior normal.     Comments: Well groomed, good eye contact, normal speech and thoughts        Results for  orders placed or performed in visit on 07/14/19  Comprehensive metabolic panel  Result Value Ref Range   Sodium 142 135 - 145 mmol/L   Potassium 5.0 3.5 - 5.1 mmol/L   Chloride 108 98 - 111 mmol/L   CO2 24 22 - 32 mmol/L   Glucose, Bld 81 70 - 99 mg/dL   BUN 19 8 - 23 mg/dL   Creatinine, Ser 0.98 0.44 - 1.00 mg/dL   Calcium 9.1 8.9 - 10.3 mg/dL   Total Protein 7.9 6.5 - 8.1 g/dL   Albumin 4.6 3.5 - 5.0 g/dL   AST 26 15 - 41 U/L   ALT 24 0 - 44 U/L   Alkaline Phosphatase 76 38 - 126 U/L   Total Bilirubin 0.8 0.3 - 1.2 mg/dL   GFR calc non Af Amer 56 (L) >60 mL/min   GFR calc Af Amer >60 >60 mL/min   Anion gap 10 5 - 15  CBC with Differential/Platelet  Result Value Ref Range   WBC 7.6 4.0 - 10.5 K/uL   RBC 4.16 3.87 - 5.11 MIL/uL   Hemoglobin 13.7 12.0 - 15.0 g/dL   HCT 41.5 36 - 46 %   MCV 99.8 80.0 - 100.0 fL   MCH 32.9 26.0 - 34.0 pg   MCHC 33.0 30.0 - 36.0 g/dL   RDW 12.5 11.5 - 15.5 %   Platelets 245 150 - 400 K/uL   nRBC 0.0 0.0 - 0.2 %   Neutrophils Relative % 68 %   Neutro Abs 5.1 1.7 - 7.7 K/uL   Lymphocytes Relative 21 %   Lymphs Abs 1.6 0.7 - 4.0 K/uL   Monocytes Relative 8 %   Monocytes Absolute 0.6 0.1 - 1.0 K/uL   Eosinophils Relative 2 %   Eosinophils Absolute 0.1 0.0 - 0.5 K/uL   Basophils Relative 1 %   Basophils Absolute 0.1 0.0 - 0.1 K/uL   Immature Granulocytes 0 %   Abs Immature Granulocytes 0.03 0.00 - 0.07 K/uL      Assessment & Plan:   Problem List Items Addressed This Visit    Recurrent major depressive episodes, mild (HCC)   Relevant Medications   busPIRone (BUSPAR) 30 MG tablet   traZODone (DESYREL) 100 MG tablet   Prediabetes   Obesity (BMI 30-39.9)   Insomnia   Relevant Medications   traZODone (DESYREL) 100 MG tablet   History of breast cancer   GAD (generalized anxiety disorder)   Relevant Medications   busPIRone (BUSPAR) 30 MG tablet   traZODone (DESYREL) 100 MG tablet   Essential hypertension   Relevant Medications    losartan (COZAAR) 100 MG tablet   Drug-induced peripheral neuropathy (HCC) - Primary   Relevant Medications   busPIRone (BUSPAR) 30 MG tablet   tizanidine (ZANAFLEX) 2 MG capsule   traZODone (DESYREL) 100 MG tablet   Back pain   Relevant Medications   tizanidine (ZANAFLEX) 2 MG capsule    Other Visit Diagnoses    Chronic frontal sinusitis       Relevant Medications  amoxicillin-clavulanate (AUGMENTIN) 875-125 MG tablet   Encounter to establish care with new doctor       Spondylosis of lumbar region without myelopathy or radiculopathy       Relevant Medications   tizanidine (ZANAFLEX) 2 MG capsule   Needs flu shot       Relevant Orders   Flu Vaccine QUAD High Dose(Fluad) (Completed)      #HTN Controlled Refill Losartan 173m daily  #Neuropathy, chemotherapy induced OFF Duloxetine Failed gabapentin in past Consider Lyrica in future if indicated Now on muscle relaxant Tizanidine  #Chronic back pain Refill Tizanidine 265mBID PRN  #Chronic sinusitis Has failed several conservative therapies, in past, has not had antibiotic in while, based on chronic persistent sinusitis symptoms, will offer empiric coverage Augmentin course. - Try to take probiotic - offer azelastine nasal - Check current spray, caution rebound rhinitis  #Depression, Anxiety Off SNRI duloxetine Re order Buspar now at 3021mnce daily, by her preference, she was taking 7.5mg24m4 = 30mg57mly at once, not BID Increase trazodone to max dose 200 to 300mg 47mtly, confirmed dose w/ pharmacy, called - sent new rx Trazodone 100mg t75mx 2-3 at bedtime   Meds ordered this encounter  Medications  . amoxicillin-clavulanate (AUGMENTIN) 875-125 MG tablet    Sig: Take 1 tablet by mouth 2 (two) times daily. For 10 days    Dispense:  20 tablet    Refill:  0  . busPIRone (BUSPAR) 30 MG tablet    Sig: Take 1 tablet (30 mg total) by mouth daily.    Dispense:  90 tablet    Refill:  1  . losartan (COZAAR) 100 MG  tablet    Sig: Take 1 tablet (100 mg total) by mouth daily.    Dispense:  90 tablet    Refill:  1  . tizanidine (ZANAFLEX) 2 MG capsule    Sig: Take 1 capsule (2 mg total) by mouth 2 (two) times daily as needed for muscle spasms.    Dispense:  180 capsule    Refill:  1  . traZODone (DESYREL) 100 MG tablet    Sig: Take 2-3 tablets (200-300 mg total) by mouth at bedtime as needed for sleep. Dose adjust according to insomnia symptom    Dispense:  270 tablet    Refill:  1     Follow up plan: Return in about 3 months (around 12/22/2020) for 3 month follow-up Back Pain, Mood/Anxiety PHQ, Insomnia, HTN.  [facility] 09/21/2020, 3:16 PM

## 2020-10-27 ENCOUNTER — Other Ambulatory Visit: Payer: Self-pay | Admitting: Oncology

## 2020-12-22 ENCOUNTER — Encounter: Payer: Self-pay | Admitting: Family Medicine

## 2020-12-22 ENCOUNTER — Other Ambulatory Visit: Payer: Self-pay

## 2020-12-22 ENCOUNTER — Ambulatory Visit (INDEPENDENT_AMBULATORY_CARE_PROVIDER_SITE_OTHER): Payer: Medicare Other | Admitting: Family Medicine

## 2020-12-22 VITALS — BP 134/73 | HR 86 | Temp 97.3°F | Resp 18 | Ht 64.0 in | Wt 211.0 lb

## 2020-12-22 DIAGNOSIS — J321 Chronic frontal sinusitis: Secondary | ICD-10-CM | POA: Diagnosis not present

## 2020-12-22 DIAGNOSIS — G8929 Other chronic pain: Secondary | ICD-10-CM | POA: Diagnosis not present

## 2020-12-22 DIAGNOSIS — M545 Low back pain, unspecified: Secondary | ICD-10-CM

## 2020-12-22 DIAGNOSIS — M47816 Spondylosis without myelopathy or radiculopathy, lumbar region: Secondary | ICD-10-CM | POA: Diagnosis not present

## 2020-12-22 DIAGNOSIS — F330 Major depressive disorder, recurrent, mild: Secondary | ICD-10-CM

## 2020-12-22 DIAGNOSIS — F5101 Primary insomnia: Secondary | ICD-10-CM

## 2020-12-22 MED ORDER — ESCITALOPRAM OXALATE 10 MG PO TABS: 10.0000 mg | ORAL_TABLET | Freq: Every day | ORAL | 2 refills | Status: DC

## 2020-12-22 MED ORDER — TRAZODONE HCL 100 MG PO TABS: 100.0000 mg | ORAL_TABLET | Freq: Every evening | ORAL | 1 refills | Status: DC | PRN

## 2020-12-22 MED ORDER — TIZANIDINE HCL 2 MG PO CAPS: 2.0000 mg | ORAL_CAPSULE | Freq: Two times a day (BID) | ORAL | 1 refills | Status: DC | PRN

## 2020-12-22 MED ORDER — IPRATROPIUM BROMIDE 0.06 % NA SOLN: 2.0000 | Freq: Four times a day (QID) | NASAL | 1 refills | Status: DC

## 2020-12-22 NOTE — Patient Instructions (Addendum)
Thank you for coming to the office today.   Start escitalopram generic lexapro 10mg  daily may take a few weeks to take effect  Reduce trazodone to 100mg  nightly  Increase tizanidine to 1-2 pills per dose twice a day to help pain and sleep  Hair loss likely related to anastrozole   Please schedule a Follow-up Appointment to: Return in about 3 months (around 03/21/2021) for 3 month follow-up Mood, Pain, Insomnia.  If you have any other questions or concerns, please feel free to call the office or send a message through [place]. You may also schedule an earlier appointment if necessary.  Additionally, you may be receiving a survey about your experience at our office within a few days to 1 week by e-mail or mail. We value your feedback.  Nobie Putnam, DO [place]

## 2020-12-22 NOTE — Progress Notes (Signed)
Subjective:    Patient ID: Heidi [name], female    DOB: 18-Feb-1941, 80 y.o.   MRN: [id number]  Heidi [name] is a 80 y.o. female presenting on 12/22/2020 for Back Pain (Chronic lower back pain. Pt state the pain level have not changed. No improvement or worsening pain.), Hypertension, and Anxiety (Insomnia. Pt currently managing her insomnia with the tizanidine. She state the Trazodone would help with falling asleep, but she was still waking up throughout the night. )   HPI   Intentional Weight Loss 06/2020 231 lbs down to 211 lbs (12/22/20) She has been working on reducing calorie intake, and down to 1200 calorie diet at times.  Stage II Left breast cancer ER/PR positive and HER-2/neu negative Followed by Dr Janese Banks S/p R mastectomy 1990, and L lumpectomy 2015, s/p radiation therapy Completed Armidex now, after 5 years hormone therapy. She will continue with q 6 month surveillance and then eventually mammogram next year  Hair loss related to anastrazole  Neuropathy, secondary to chemotherapy Reports bilateral finger tip numbness, difficulty with dextrous activities and typing, still has some symptoms in lower extremity Previously taking Duloxetine 11m daily but limited relief for numbness, so has stopped Tried Gabapentin before, no relief.  Chronic Sinusitis History of recurrent issues seems >1+ years has chronic problem with dry mouth and sinuses some secondary drainage and drip on Left side. She has seen prior PCP for this same issue. She now feels this is worse to the point of pain and pressure concern for sinusitis infection, she would like to take antibiotic, it has worked before with Augmentin. She has not seen ENT before, no imaging on sinuses recently. Tried various nose sprays saline and also flonase and other OTC limited relief. She denies taking Afrin - She has used Phenylepherine OTC nasal spray mixed results.   CHRONIC HTN: Reports controlled Current Meds - Losartan 1035m daily Reports good compliance, took meds today. Tolerating well, w/o complaints. Denies CP, dyspnea, HA, edema, dizziness / lightheadedness  Chronic Low Back Pain DJD Lumbar spine Back Spasms Back in 2016 had back injury, she had imaging with X-ray 2017 with DJD, she has been treated multiple times for this and has taken meds, including now Tizanidine, takes 68m78mwice a day PRN with good relief, needs more pill count to take regularly. - Occasionally if more active, takes Ibuprofen 200m71m4 = 800mg70m with some good results, but now taking it less. - Stopped Tylenol since no relief with 2 pills - Taking Turmeric some relief  Recurrent Depression, mild chronic Generalized Anxiety Disorder Chronic problems, seem episodic, can be related to health and stress as well No longer on Duloxetine, did not help her mood Doing well on Buspar 7.5mg x76min am 30mg i67me dose, seems to last part of day and is very effective, she functions better on this, takes it daily. Prior doctor said it was okay.  Previously on Sertraline, Prozac, Cymbalta. Since age 20s. As33sg about other anti depressant today No longer hand shaking from Cymbalta.   - still taking Trazodone 200mg, a768med she can reduce to 100mg nig58m since the 200mg was 19mhelping. Tizanidine muscle relaxant helped most with sleep. Need adjusted rx    Depression screen PHQ 2/9 2/[id number] 09/21/2020 03/26/2018  Decreased Interest 1 1 0  Down, Depressed, Hopeless 0 0 0  PHQ - 2 Score 1 1 0  Altered sleeping 2 3 -  Tired, decreased energy 1 0 -  Change  in appetite 0 0 -  Feeling bad or failure about yourself  0 0 -  Trouble concentrating 1 1 -  Moving slowly or fidgety/restless 0 0 -  Suicidal thoughts 0 0 -  PHQ-9 Score 5 5 -  Difficult doing work/chores Very difficult Somewhat difficult -    Social History   Tobacco Use   Smoking status: Former Smoker    Packs/day: 2.00    Years: 25.00    Pack years: 50.00    Types:  Cigarettes    Quit date: 04/28/1992    Years since quitting: 28.6   Smokeless tobacco: Former Systems developer   Tobacco comment: Pt quit 1995  Vaping Use   Vaping Use: Never used  Substance Use Topics   Alcohol use: No    Alcohol/week: 0.0 standard drinks   Drug use: No    Review of Systems Per HPI unless specifically indicated above     Objective:    BP 134/73 (BP Location: Right Arm, Patient Position: Sitting, Cuff Size: Large)    Pulse 86    Temp (!) 97.3 F (36.3 C) (Temporal)    Resp 18    Ht _0  (1.626 m)    Wt 211 lb (95.7 kg)    SpO2 100%    BMI 36.22 kg/m   Wt Readings from Last 3 Encounters:  12/22/20 211 lb (95.7 kg)  09/21/20 219 lb 9.6 oz (99.6 kg)  07/19/20 231 lb (104.8 kg)    Physical Exam Vitals and nursing note reviewed.  Constitutional:      General: She is not in acute distress.    Appearance: She is well-developed. She is not diaphoretic.     Comments: Well-appearing, comfortable, cooperative  HENT:     Head: Normocephalic and atraumatic.  Eyes:     General:        Right eye: No discharge.        Left eye: No discharge.     Conjunctiva/sclera: Conjunctivae normal.  Neck:     Thyroid: No thyromegaly.  Cardiovascular:     Rate and Rhythm: Normal rate and regular rhythm.     Heart sounds: Normal heart sounds. No murmur heard.   Pulmonary:     Effort: Pulmonary effort is normal. No respiratory distress.     Breath sounds: Normal breath sounds. No wheezing or rales.  Musculoskeletal:        General: Normal range of motion.     Cervical back: Normal range of motion and neck supple.     Comments: Has cane  Lymphadenopathy:     Cervical: No cervical adenopathy.  Skin:    General: Skin is warm and dry.     Findings: No erythema or rash.  Neurological:     Mental Status: She is alert and oriented to person, place, and time.  Psychiatric:        Behavior: Behavior normal.     Comments: Well groomed, good eye contact, normal speech and thoughts     Results for orders placed or performed in visit on 07/14/19  Comprehensive metabolic panel  Result Value Ref Range   Sodium 142 135 - 145 mmol/L   Potassium 5.0 3.5 - 5.1 mmol/L   Chloride 108 98 - 111 mmol/L   CO2 24 22 - 32 mmol/L   Glucose, Bld 81 70 - 99 mg/dL   BUN 19 8 - 23 mg/dL   Creatinine, Ser 0.98 0.44 - 1.00 mg/dL   Calcium 9.1 8.9 - 10.3 mg/dL  Total Protein 7.9 6.5 - 8.1 g/dL   Albumin 4.6 3.5 - 5.0 g/dL   AST 26 15 - 41 U/L   ALT 24 0 - 44 U/L   Alkaline Phosphatase 76 38 - 126 U/L   Total Bilirubin 0.8 0.3 - 1.2 mg/dL   GFR calc non Af Amer 56 (L) >60 mL/min   GFR calc Af Amer >60 >60 mL/min   Anion gap 10 5 - 15  CBC with Differential/Platelet  Result Value Ref Range   WBC 7.6 4.0 - 10.5 K/uL   RBC 4.16 3.87 - 5.11 MIL/uL   Hemoglobin 13.7 12.0 - 15.0 g/dL   HCT 41.5 36.0 - 46.0 %   MCV 99.8 80.0 - 100.0 fL   MCH 32.9 26.0 - 34.0 pg   MCHC 33.0 30.0 - 36.0 g/dL   RDW 12.5 11.5 - 15.5 %   Platelets 245 150 - 400 K/uL   nRBC 0.0 0.0 - 0.2 %   Neutrophils Relative % 68 %   Neutro Abs 5.1 1.7 - 7.7 K/uL   Lymphocytes Relative 21 %   Lymphs Abs 1.6 0.7 - 4.0 K/uL   Monocytes Relative 8 %   Monocytes Absolute 0.6 0.1 - 1.0 K/uL   Eosinophils Relative 2 %   Eosinophils Absolute 0.1 0.0 - 0.5 K/uL   Basophils Relative 1 %   Basophils Absolute 0.1 0.0 - 0.1 K/uL   Immature Granulocytes 0 %   Abs Immature Granulocytes 0.03 0.00 - 0.07 K/uL      Assessment & Plan:   Problem List Items Addressed This Visit    Recurrent major depressive episodes, mild (HCC)   Relevant Medications   traZODone (DESYREL) 100 MG tablet   escitalopram (LEXAPRO) 10 MG tablet   Insomnia - Primary   Relevant Medications   traZODone (DESYREL) 100 MG tablet   Back pain   Relevant Medications   tizanidine (ZANAFLEX) 2 MG capsule    Other Visit Diagnoses    Spondylosis of lumbar region without myelopathy or radiculopathy       Relevant Medications   tizanidine (ZANAFLEX)  2 MG capsule   Chronic frontal sinusitis       Relevant Medications   ipratropium (ATROVENT) 0.06 % nasal spray      Start escitalopram generic lexapro 60m daily may take a few weeks to take effect, new rx (Off Cymbalta, see above failed SSRI / SNRI med list)  Reduce trazodone to 109mnightly  Increase tizanidine 61m30mo 1-2 pills per dose twice a day to help pain and sleep, new rx  Hair loss likely related to anastrozole    Meds ordered this encounter  Medications   tizanidine (ZANAFLEX) 2 MG capsule    Sig: Take 1-2 capsules (2-4 mg total) by mouth 2 (two) times daily as needed for muscle spasms.    Dispense:  360 capsule    Refill:  1   traZODone (DESYREL) 100 MG tablet    Sig: Take 1 tablet (100 mg total) by mouth at bedtime as needed for sleep. Dose adjust according to insomnia symptom    Dispense:  90 tablet    Refill:  1    Do not fill, but please change directions / instructions / dose. Thank you   ipratropium (ATROVENT) 0.06 % nasal spray    Sig: Place 2 sprays into both nostrils 4 (four) times daily. As needed for nasal congestion    Dispense:  15 mL    Refill:  1  escitalopram (LEXAPRO) 10 MG tablet    Sig: Take 1 tablet (10 mg total) by mouth daily.    Dispense:  30 tablet    Refill:  2      Follow up plan: Return in about 3 months (around 03/21/2021) for 3 month follow-up Mood, Pain, Insomnia.   Nobie Putnam, [facility] 12/22/2020, 2:48 PM

## 2020-12-23 DIAGNOSIS — H18421 Band keratopathy, right eye: Secondary | ICD-10-CM | POA: Diagnosis not present

## 2021-01-18 ENCOUNTER — Other Ambulatory Visit: Payer: Medicare Other

## 2021-01-18 ENCOUNTER — Ambulatory Visit: Payer: Medicare Other | Admitting: Oncology

## 2021-01-18 ENCOUNTER — Other Ambulatory Visit: Payer: Self-pay | Admitting: Oncology

## 2021-01-20 ENCOUNTER — Telehealth: Payer: Self-pay | Admitting: Family Medicine

## 2021-01-20 DIAGNOSIS — F330 Major depressive disorder, recurrent, mild: Secondary | ICD-10-CM

## 2021-01-20 DIAGNOSIS — F411 Generalized anxiety disorder: Secondary | ICD-10-CM

## 2021-01-20 MED ORDER — SERTRALINE HCL 50 MG PO TABS: ORAL_TABLET | ORAL | 1 refills | Status: DC

## 2021-01-20 NOTE — Telephone Encounter (Signed)
Sent new rx  I see chart shows she was on 100mg  in past, 2016.  I have not rx it for her.  Please notify her that I sent new rx  Start with 50mg  daily (1 tab) after 2-4 weeks if needed, may increase to 2 pills for 100mg  daily. Notify office for new rx if doing well on 100mg .  Nobie Putnam, [facility] 01/20/2021, 4:46 PM

## 2021-01-20 NOTE — Telephone Encounter (Signed)
Attempted to call pt to notify about her new rx but no answer. Copied from [place] [phone]. Topic: General - Inquiry >> Jan 20, 2021  2:19 PM Greggory Keen D wrote: Reason for CRM: Pt wants to know if Dr. Raliegh Ip will put her back on the Zoloft.  She says the Lexapro is making her hands shake.  Felicity  CB#  [phone]

## 2021-01-24 ENCOUNTER — Inpatient Hospital Stay: Payer: Medicare Other | Admitting: Oncology

## 2021-01-24 ENCOUNTER — Inpatient Hospital Stay: Payer: Medicare Other

## 2021-01-31 ENCOUNTER — Inpatient Hospital Stay (HOSPITAL_BASED_OUTPATIENT_CLINIC_OR_DEPARTMENT_OTHER): Payer: Medicare Other | Admitting: Oncology

## 2021-01-31 ENCOUNTER — Inpatient Hospital Stay: Payer: Medicare Other | Attending: Oncology

## 2021-01-31 ENCOUNTER — Encounter: Payer: Self-pay | Admitting: Oncology

## 2021-01-31 VITALS — BP 151/72 | HR 88 | Temp 98.4°F | Resp 16 | Ht 64.0 in | Wt 210.0 lb

## 2021-01-31 DIAGNOSIS — Z79899 Other long term (current) drug therapy: Secondary | ICD-10-CM | POA: Insufficient documentation

## 2021-01-31 DIAGNOSIS — Z7901 Long term (current) use of anticoagulants: Secondary | ICD-10-CM | POA: Insufficient documentation

## 2021-01-31 DIAGNOSIS — Z853 Personal history of malignant neoplasm of breast: Secondary | ICD-10-CM

## 2021-01-31 DIAGNOSIS — Z9221 Personal history of antineoplastic chemotherapy: Secondary | ICD-10-CM | POA: Insufficient documentation

## 2021-01-31 DIAGNOSIS — Z170 Estrogen receptor positive status [ER+]: Secondary | ICD-10-CM | POA: Diagnosis not present

## 2021-01-31 DIAGNOSIS — R5383 Other fatigue: Secondary | ICD-10-CM | POA: Diagnosis not present

## 2021-01-31 DIAGNOSIS — I10 Essential (primary) hypertension: Secondary | ICD-10-CM | POA: Insufficient documentation

## 2021-01-31 DIAGNOSIS — M1990 Unspecified osteoarthritis, unspecified site: Secondary | ICD-10-CM | POA: Diagnosis not present

## 2021-01-31 DIAGNOSIS — Z08 Encounter for follow-up examination after completed treatment for malignant neoplasm: Secondary | ICD-10-CM | POA: Diagnosis not present

## 2021-01-31 DIAGNOSIS — G629 Polyneuropathy, unspecified: Secondary | ICD-10-CM | POA: Insufficient documentation

## 2021-01-31 DIAGNOSIS — Z86711 Personal history of pulmonary embolism: Secondary | ICD-10-CM | POA: Diagnosis not present

## 2021-01-31 DIAGNOSIS — Z79811 Long term (current) use of aromatase inhibitors: Secondary | ICD-10-CM | POA: Insufficient documentation

## 2021-01-31 DIAGNOSIS — M810 Age-related osteoporosis without current pathological fracture: Secondary | ICD-10-CM

## 2021-01-31 DIAGNOSIS — F418 Other specified anxiety disorders: Secondary | ICD-10-CM | POA: Diagnosis not present

## 2021-01-31 DIAGNOSIS — E785 Hyperlipidemia, unspecified: Secondary | ICD-10-CM | POA: Insufficient documentation

## 2021-01-31 DIAGNOSIS — Z87891 Personal history of nicotine dependence: Secondary | ICD-10-CM | POA: Insufficient documentation

## 2021-01-31 DIAGNOSIS — R5381 Other malaise: Secondary | ICD-10-CM | POA: Insufficient documentation

## 2021-01-31 DIAGNOSIS — R0609 Other forms of dyspnea: Secondary | ICD-10-CM | POA: Insufficient documentation

## 2021-01-31 DIAGNOSIS — C50812 Malignant neoplasm of overlapping sites of left female breast: Secondary | ICD-10-CM | POA: Insufficient documentation

## 2021-01-31 DIAGNOSIS — Z9049 Acquired absence of other specified parts of digestive tract: Secondary | ICD-10-CM | POA: Insufficient documentation

## 2021-01-31 DIAGNOSIS — Z923 Personal history of irradiation: Secondary | ICD-10-CM | POA: Diagnosis not present

## 2021-01-31 DIAGNOSIS — Z5181 Encounter for therapeutic drug level monitoring: Secondary | ICD-10-CM

## 2021-01-31 LAB — CBC WITH DIFFERENTIAL/PLATELET
Abs Immature Granulocytes: 0.02 10*3/uL (ref 0.00–0.07)
Basophils Absolute: 0 10*3/uL (ref 0.0–0.1)
Basophils Relative: 1 %
Eosinophils Absolute: 0.1 10*3/uL (ref 0.0–0.5)
Eosinophils Relative: 2 %
HCT: 38.9 % (ref 36.0–46.0)
Hemoglobin: 12.8 g/dL (ref 12.0–15.0)
Immature Granulocytes: 0 %
Lymphocytes Relative: 25 %
Lymphs Abs: 1.5 10*3/uL (ref 0.7–4.0)
MCH: 32.9 pg (ref 26.0–34.0)
MCHC: 32.9 g/dL (ref 30.0–36.0)
MCV: 100 fL (ref 80.0–100.0)
Monocytes Absolute: 0.5 10*3/uL (ref 0.1–1.0)
Monocytes Relative: 9 %
Neutro Abs: 3.6 10*3/uL (ref 1.7–7.7)
Neutrophils Relative %: 63 %
Platelets: 196 10*3/uL (ref 150–400)
RBC: 3.89 MIL/uL (ref 3.87–5.11)
RDW: 12.1 % (ref 11.5–15.5)
WBC: 5.7 10*3/uL (ref 4.0–10.5)
nRBC: 0 % (ref 0.0–0.2)

## 2021-01-31 LAB — COMPREHENSIVE METABOLIC PANEL
ALT: 25 U/L (ref 0–44)
AST: 25 U/L (ref 15–41)
Albumin: 3.9 g/dL (ref 3.5–5.0)
Alkaline Phosphatase: 55 U/L (ref 38–126)
Anion gap: 9 (ref 5–15)
BUN: 18 mg/dL (ref 8–23)
CO2: 25 mmol/L (ref 22–32)
Calcium: 8.8 mg/dL — ABNORMAL LOW (ref 8.9–10.3)
Chloride: 108 mmol/L (ref 98–111)
Creatinine, Ser: 0.79 mg/dL (ref 0.44–1.00)
GFR, Estimated: >60 mL/min (ref >60)
Glucose, Bld: 107 mg/dL — ABNORMAL HIGH (ref 70–99)
Potassium: 4 mmol/L (ref 3.5–5.1)
Sodium: 142 mmol/L (ref 135–145)
Total Bilirubin: 0.7 mg/dL (ref 0.3–1.2)
Total Protein: 6.7 g/dL (ref 6.5–8.1)

## 2021-01-31 NOTE — Progress Notes (Signed)
Pt states that she is sob on exertion at times. Back pain today but better than usual. On a weight schedule- so 400 calories three times a day.

## 2021-01-31 NOTE — Progress Notes (Signed)
Hematology/Oncology Consult note [facility]  Telephone:([phone] [phone]  Patient Care Team: Olin Hauser, DO as PCP - General (Family Medicine)   Name of the patient: Heidi [name]  [id number]  June 29, 1941   Date of visit: 01/31/21  Diagnosis- stage II left breast cancer ER/PR positive and HER-2/neu negative  Chief complaint/ Reason for visit-routine follow-up of breast cancer  Heme/Onc history:  Oncology History Overview Note  # March 2016- LEFT BREAST CANCER STAGE II ER- 90%; PR-90%; Her 2 neu- NEG; NEO-ADJ CHEMO-  AC x4- Taxol x12 w;;s/p LUMPEC & SLNBx [ypT1 [1.5cm]; ypsN 3/6 (2 macro & 1 micro met)]  s/p RT [Nov 1st week;finish] NOV 2016- START FEMARA stop Feb 2017; April 2017- start Arimidex  # Right Breast cancer ? Stage;  s/p Mastec [1990;s/p Chemo; RT ]  # PN G-2 on neurontin; Hx PE on eliquis  # Mediport   Cancer of overlapping sites of left female breast ([place]) (Resolved)  06/09/2016 Initial Diagnosis   Cancer of overlapping sites of left female breast [facility])    Patient completed 5 years of Arimidex in 2021 November  Interval history-patient is actively working on losing weight and her goal weight is 165 pounds.  She is also going for back exercises which has been helping her.  Neuropathy stable with Cymbalta.  She stopped Arimidex after taking it for 5 years in November 2021.  Patient reports exertional shortness of breath.  States that she has had cardiology as well as pulmonary work-up which was unremarkable.  ECOG PS- 2 Pain scale- 3 Opioid associated constipation- no  Review of systems- Review of Systems  Constitutional: Positive for malaise/fatigue. Negative for chills, fever and weight loss.  HENT: Negative for congestion, ear discharge and nosebleeds.   Eyes: Negative for blurred vision.  Respiratory: Positive for shortness of breath. Negative for cough, hemoptysis, sputum production and wheezing.    Cardiovascular: Negative for chest pain, palpitations, orthopnea and claudication.  Gastrointestinal: Negative for abdominal pain, blood in stool, constipation, diarrhea, heartburn, melena, nausea and vomiting.  Genitourinary: Negative for dysuria, flank pain, frequency, hematuria and urgency.  Musculoskeletal: Positive for back pain. Negative for joint pain and myalgias.  Skin: Negative for rash.  Neurological: Negative for dizziness, tingling, focal weakness, seizures, weakness and headaches.  Endo/Heme/Allergies: Does not bruise/bleed easily.  Psychiatric/Behavioral: Negative for depression and suicidal ideas. The patient does not have insomnia.        Allergies  Allergen Reactions  . Other Anaphylaxis    Uncoded Allergy. Allergen: HYDROCHLORLIC ACID- causes water blisters  . Diphenhydramine Other (See Comments)    pt states makes legs very restless and ache  . Hydrochloric Acid Hives    Other reaction(s): Blisters Uncoded Allergy. Allergen: HYDROCHLORLIC ACID.     Past Medical History:  Diagnosis Date  . Anemia   . Anxiety   . Breast cancer ([place]) 1990   RT MASTECTOMY  . C. difficile diarrhea    see dr Vira Agar and this is her second course of vancomycin  . Cataracts, bilateral   . Depression   . Dyspnea    with exersion  . Hepatitis 1960's   does not know which type  . Hyperlipidemia   . Hypertension   . Insomnia   . Neuropathy due to chemotherapeutic drug ([place])   . Osteoarthritis   . Osteoporosis   . Personal history of radiation therapy 2016   LEFT lumpectomy     Past Surgical History:  Procedure Laterality  Date  . APPENDECTOMY  1970's  . BREAST BIOPSY Left 05/07/2015   Procedure: BREAST BIOPSY WITH NEEDLE LOCALIZATION;  Surgeon: Dia Crawford III, MD;  Location: [facility] ORS;  Service: General;  Laterality: Left;  . BREAST LUMPECTOMY Left 2016  . BREAST LUMPECTOMY WITH SENTINEL LYMPH NODE BIOPSY Left 05/07/2015   Procedure: PARTIAL MASTECTOMY WITH SENTINEL LYMPH  NODE BX;  Surgeon: Dia Crawford III, MD;  Location: [facility] ORS;  Service: General;  Laterality: Left;  . BREAST SURGERY Right 1990   mastectomy  . EYE SURGERY Bilateral    cataract extractions  . MASTECTOMY Right    1990's  . PORT-A-CATH REMOVAL Right 03/07/2017   Procedure: REMOVAL PORT-A-CATH;  Surgeon: Vickie Epley, MD;  Location: [facility] ORS;  Service: General;  Laterality: Right;    Social History   Socioeconomic History  . Marital status: Divorced    Spouse name: Not on file  . Number of children: Not on file  . Years of education: Not on file  . Highest education level: Not on file  Occupational History  . Not on file  Tobacco Use  . Smoking status: Former Smoker    Packs/day: 2.00    Years: 25.00    Pack years: 50.00    Types: Cigarettes    Quit date: 04/28/1992    Years since quitting: 28.7  . Smokeless tobacco: Former Systems developer  . Tobacco comment: Pt quit 1995  Vaping Use  . Vaping Use: Never used  Substance and Sexual Activity  . Alcohol use: No    Alcohol/week: 0.0 standard drinks  . Drug use: No  . Sexual activity: Never  Other Topics Concern  . Not on file  Social History Narrative  . Not on file   Social Determinants of Health   Financial Resource Strain: Not on file  Food Insecurity: Not on file  Transportation Needs: Not on file  Physical Activity: Not on file  Stress: Not on file  Social Connections: Not on file  Intimate Partner Violence: Not on file    Family History  Problem Relation Age of Onset  . Dementia Mother   . Bladder Cancer Father   . Diabetes Sister   . Breast cancer Neg Hx      Current Outpatient Medications:  .  alendronate (FOSAMAX) 70 MG tablet, TAKE ONE TABLET BY MOUTH ONCE WEEKLY ON AN EMPTY STOMACH BEFORE BREAKFAST. REMAIN UPRIGHT FOR 30 MINUTES AND TAKE WITH 8OZ OF WATER, Disp: 12 tablet, Rfl: 0 .  BucAlfAspKGlucCouchParsUvaUrJu (WATER PILLS PO), Take 1-2 tablets by mouth daily as needed (for fluid retention). (Patient not  taking: Reported on 12/22/2020), Disp: , Rfl:  .  busPIRone (BUSPAR) 30 MG tablet, Take 1 tablet (30 mg total) by mouth daily., Disp: 90 tablet, Rfl: 1 .  Calcium Carb-Cholecalciferol (CALCIUM 600 + D PO), Take 1 tablet by mouth 2 (two) times a day., Disp: , Rfl:  .  ipratropium (ATROVENT) 0.06 % nasal spray, Place 2 sprays into both nostrils 4 (four) times daily. As needed for nasal congestion, Disp: 15 mL, Rfl: 1 .  losartan (COZAAR) 100 MG tablet, Take 1 tablet (100 mg total) by mouth daily., Disp: 90 tablet, Rfl: 1 .  Multiple Vitamin (MULTIVITAMIN WITH MINERALS) TABS tablet, Take 1 tablet by mouth at bedtime. Senior Multivitamin, Disp: , Rfl:  .  NON FORMULARY, Take 2 capsules by mouth daily. Dynamic brain, Disp: , Rfl:  .  Red Yeast Rice 600 MG CAPS, Take 2 capsules by mouth daily., Disp: ,  Rfl:  .  sertraline (ZOLOFT) 50 MG tablet, Start with 37m daily (1 tab) after 2-4 weeks if needed, may increase to 2 pills for 1094mdaily. Notify office for new rx if doing well on 10045m Disp: 60 tablet, Rfl: 1 .  tizanidine (ZANAFLEX) 2 MG capsule, Take 1-2 capsules (2-4 mg total) by mouth 2 (two) times daily as needed for muscle spasms., Disp: 360 capsule, Rfl: 1 .  traZODone (DESYREL) 100 MG tablet, Take 1 tablet (100 mg total) by mouth at bedtime as needed for sleep. Dose adjust according to insomnia symptom, Disp: 90 tablet, Rfl: 1 .  Turmeric 400 MG CAPS, Take 1 capsule by mouth 2 (two) times daily., Disp: , Rfl:   Physical exam:  Vitals:   01/31/21 1516  BP: (!) 151/72  Pulse: 88  Resp: 16  Temp: 98.4 F (36.9 C)  TempSrc: Tympanic  Weight: 210 lb (95.3 kg)  Height: '5\' 4"'  (1.626 m)   Physical Exam Constitutional:      General: She is not in acute distress. Cardiovascular:     Rate and Rhythm: Normal rate and regular rhythm.     Heart sounds: Normal heart sounds.  Pulmonary:     Effort: Pulmonary effort is normal.     Breath sounds: Normal breath sounds.  Skin:    General: Skin is  warm and dry.  Neurological:     Mental Status: She is alert and oriented to person, place, and time.     Breast exam: Patient is s/p right mastectomy without reconstruction.  No evidence of chest wall recurrence.  She is s/p left lumpectomy with a well-healed surgical scar.  No palpable bilateral axillary adenopathy.   CMP Latest Ref Rng & Units 01/31/2021  Glucose 70 - 99 mg/dL 107(H)  BUN 8 - 23 mg/dL 18  Creatinine 0.44 - 1.00 mg/dL 0.79  Sodium 135 - 145 mmol/L 142  Potassium 3.5 - 5.1 mmol/L 4.0  Chloride 98 - 111 mmol/L 108  CO2 22 - 32 mmol/L 25  Calcium 8.9 - 10.3 mg/dL 8.8(L)  Total Protein 6.5 - 8.1 g/dL 6.7  Total Bilirubin 0.3 - 1.2 mg/dL 0.7  Alkaline Phos 38 - 126 U/L 55  AST 15 - 41 U/L 25  ALT 0 - 44 U/L 25   CBC Latest Ref Rng & Units 01/31/2021  WBC 4.0 - 10.5 K/uL 5.7  Hemoglobin 12.0 - 15.0 g/dL 12.8  Hematocrit 36.0 - 46.0 % 38.9  Platelets 150 - 400 K/uL 196     Assessment and plan- Patient is a 79 33o. female with history of left breast cancer stage I status post lumpectomy, adjuvant radiation therapy and 5 years of Arimidex as well as right mastectomy in the past here for routine follow-up  Clinically patient is doing well with no concerning signs and symptoms of recurrence based on today's exam.  She has completed 5 years of Arimidex.  She will need a repeat mammogram and bone density scan in August 2022 which I will schedule.  Exertional shortness of breath: Patient states that this is a chronic issue and she has seen Dr. FatUbaldo Glassing well as pulmonary in the past.  If her symptoms worsen we will consider getting a CT scan.  Labs are otherwise unremarkable   Visit Diagnosis 1. Encounter for follow-up surveillance of breast cancer   2. Osteoporosis without current pathological fracture, unspecified osteoporosis type      Dr. ArcRanda EvensD, MPH [phone]  01/31/2021 3:52 PM

## 2021-02-12 DIAGNOSIS — N390 Urinary tract infection, site not specified: Secondary | ICD-10-CM | POA: Diagnosis not present

## 2021-03-11 ENCOUNTER — Ambulatory Visit: Payer: Medicare Other | Attending: Internal Medicine

## 2021-03-11 DIAGNOSIS — Z23 Encounter for immunization: Secondary | ICD-10-CM

## 2021-03-11 NOTE — Progress Notes (Signed)
   Covid-19 Vaccination Clinic  Name:  Heidi [name]    MRN: [id number] DOB: 1941/08/29  03/11/2021  [name] was observed post Covid-19 immunization for 15 minutes without incident. She was provided with Vaccine Information Sheet and instruction to access the V-Safe system.   [name] was instructed to call 911 with any severe reactions post vaccine: Marland Kitchen Difficulty breathing  . Swelling of face and throat  . A fast heartbeat  . A bad rash all over body  . Dizziness and weakness   Immunizations Administered    Name Date Dose VIS Date Route   PFIZER Comrnaty(Gray TOP) Covid-19 Vaccine 03/11/2021  2:33 PM 0.3 mL 10/07/2020 Intramuscular   Manufacturer: [place]   Lot: [id number]   NDC: [place], PharmD, MBA Clinical Acute Care Pharmacist

## 2021-03-15 ENCOUNTER — Other Ambulatory Visit: Payer: Self-pay

## 2021-03-15 ENCOUNTER — Other Ambulatory Visit: Payer: Self-pay | Admitting: Family Medicine

## 2021-03-15 DIAGNOSIS — F411 Generalized anxiety disorder: Secondary | ICD-10-CM

## 2021-03-15 MED ORDER — PFIZER-BIONT COVID-19 VAC-TRIS 30 MCG/0.3ML IM SUSP
INTRAMUSCULAR | 0 refills | Status: DC
  Filled 2021-03-15: qty 0.3, 1d supply, fill #0

## 2021-03-19 ENCOUNTER — Other Ambulatory Visit: Payer: Self-pay | Admitting: Family Medicine

## 2021-03-19 DIAGNOSIS — I10 Essential (primary) hypertension: Secondary | ICD-10-CM

## 2021-03-19 NOTE — Telephone Encounter (Signed)
Requested Prescriptions  Pending Prescriptions Disp Refills  . losartan (COZAAR) 100 MG tablet [Pharmacy Med Name: LOSARTAN POTASSIUM 100 MG TAB] 90 tablet 0    Sig: TAKE ONE TABLET BY MOUTH DAILY     Cardiovascular:  Angiotensin Receptor Blockers Failed - 03/19/2021 10:03 AM      Failed - Last BP in normal range    BP Readings from Last 1 Encounters:  01/31/21 (!) 151/72         Passed - Cr in normal range and within 180 days    Creatinine  Date Value Ref Range Status  02/19/2015 0.46 mg/dL Final    Comment:    0.44-1.00 NOTE: New Reference Range  01/05/15    Creatinine, Ser  Date Value Ref Range Status  01/31/2021 0.79 0.44 - 1.00 mg/dL Final   Creatine, Serum  Date Value Ref Range Status  02/24/2015 0.90  Final         Passed - K in normal range and within 180 days    Potassium  Date Value Ref Range Status  01/31/2021 4.0 3.5 - 5.1 mmol/L Final  02/19/2015 4.0 mmol/L Final    Comment:    3.5-5.1 NOTE: New Reference Range  01/05/15          Passed - Patient is not pregnant      Passed - Valid encounter within last 6 months    Recent Outpatient Visits          2 months ago Primary insomnia   [place], DO   5 months ago Drug-induced peripheral neuropathy [facility])   [facility] Olin Hauser, DO      Future Appointments            In 1 week Parks [place] Devonne Doughty, [facility], [facility]

## 2021-03-23 ENCOUNTER — Ambulatory Visit: Payer: Medicare Other | Admitting: Family Medicine

## 2021-03-30 ENCOUNTER — Encounter: Payer: Self-pay | Admitting: Family Medicine

## 2021-03-30 ENCOUNTER — Other Ambulatory Visit: Payer: Self-pay

## 2021-03-30 ENCOUNTER — Ambulatory Visit (INDEPENDENT_AMBULATORY_CARE_PROVIDER_SITE_OTHER): Payer: Medicare Other | Admitting: Family Medicine

## 2021-03-30 VITALS — BP 138/70 | HR 90 | Ht 64.0 in | Wt 198.0 lb

## 2021-03-30 DIAGNOSIS — G620 Drug-induced polyneuropathy: Secondary | ICD-10-CM

## 2021-03-30 DIAGNOSIS — F330 Major depressive disorder, recurrent, mild: Secondary | ICD-10-CM

## 2021-03-30 DIAGNOSIS — J321 Chronic frontal sinusitis: Secondary | ICD-10-CM

## 2021-03-30 DIAGNOSIS — R251 Tremor, unspecified: Secondary | ICD-10-CM | POA: Diagnosis not present

## 2021-03-30 DIAGNOSIS — F411 Generalized anxiety disorder: Secondary | ICD-10-CM | POA: Diagnosis not present

## 2021-03-30 DIAGNOSIS — F5101 Primary insomnia: Secondary | ICD-10-CM | POA: Diagnosis not present

## 2021-03-30 MED ORDER — SERTRALINE HCL 50 MG PO TABS: 50.0000 mg | ORAL_TABLET | Freq: Every day | ORAL | 1 refills | Status: DC

## 2021-03-30 MED ORDER — BENZTROPINE MESYLATE 1 MG PO TABS: 1.0000 mg | ORAL_TABLET | Freq: Two times a day (BID) | ORAL | 3 refills | Status: DC | PRN

## 2021-03-30 NOTE — Patient Instructions (Addendum)
Thank you for coming to the office today.  Start Benztropine for the hand tremors shaking. These can be side effect of medication. Take as needed, if not helping we can refer to Neurologist.  BP improved. Keep an eye on this.  Continue Sertraline 50mg  daily.  [id number]  [place], [place] [id number] Ph: [phone]  Please schedule a Follow-up Appointment to: Return in about 3 months (around 06/30/2021) for 3 month follow-up Mood PHQ, Insomnia, Hand shaking.  If you have any other questions or concerns, please feel free to call the office or send a message through [place]. You may also schedule an earlier appointment if necessary.  Additionally, you may be receiving a survey about your experience at our office within a few days to 1 week by e-mail or mail. We value your feedback.  Nobie Putnam, DO [place]

## 2021-03-30 NOTE — Progress Notes (Signed)
Subjective:    Patient ID: Heidi [name], female    DOB: Oct 30, 1941, 80 y.o.   MRN: [id number]  Heidi [name] is a 80 y.o. female presenting on 03/30/2021 for Insomnia   HPI   Recurrent Depression, mild chronic Generalized Anxiety Disorder Chronic problems, seem episodic, can be related to health and stress as well No longer on Duloxetine, did not help her mood Doing well on Buspar 7.5mg  x 4 in am 30mg  in one dose, seems to last part of day and is very effective, she functions better on this, takes it daily.  Previously on Sertraline, Prozac, Cymbalta. Since age 56s. No longer hand shaking from Cymbalta.  Updates since last visit she was trialed on Escitalopram, had shaky hands stopped, then we switched back to Sertraline.  Today she provides update that her mood is much improved on Sertraline 50mg  daily, could not tolerate 100mg  dose increase due to shaky hands similar complaint in past with other medications - Admits for insomnia, much improved on Tizanidine. She takes Trazodone 100mg  nightly PRN only about once every 2 weeks. It does cause dry mouth. - Taking Tizanidine for sleep with improvement, taking 2mg  x 2 dose in afternoon or PM to help. - admits hand shaking similar to before on these medications  Elevated BP Admits sedentary not as active.    Additional history  Chronic Sinusitis Requesting referral to ENT.  History of UTI, had seen Urgent Care. Treated.   Depression screen [facility] 2/9 03/30/2021 12/23/2020 09/21/2020  Decreased Interest 0 1 1  Down, Depressed, Hopeless 0 0 0  PHQ - 2 Score 0 1 1  Altered sleeping 0 2 3  Tired, decreased energy 1 1 0  Change in appetite 0 0 0  Feeling bad or failure about yourself  0 0 0  Trouble concentrating 1 1 1   Moving slowly or fidgety/restless 0 0 0  Suicidal thoughts 0 0 0  PHQ-9 Score 2 5 5   Difficult doing work/chores Not difficult at all Very difficult Somewhat difficult    Social History   Tobacco Use  . Smoking  status: Former Smoker    Packs/day: 2.00    Years: 25.00    Pack years: 50.00    Types: Cigarettes    Quit date: 04/28/1992    Years since quitting: 28.9  . Smokeless tobacco: Former Systems developer  . Tobacco comment: Pt quit 1995  Vaping Use  . Vaping Use: Never used  Substance Use Topics  . Alcohol use: No    Alcohol/week: 0.0 standard drinks  . Drug use: No    Review of Systems Per HPI unless specifically indicated above     Objective:    BP 138/70 (BP Location: Left Arm, Cuff Size: Normal)   Pulse 90   Ht 5\' 4"  (1.626 m)   Wt 198 lb (89.8 kg)   SpO2 99%   BMI 33.99 kg/m   Wt Readings from Last 3 Encounters:  03/30/21 198 lb (89.8 kg)  01/31/21 210 lb (95.3 kg)  12/22/20 211 lb (95.7 kg)    Physical Exam Vitals and nursing note reviewed.  Constitutional:      General: She is not in acute distress.    Appearance: She is well-developed. She is not diaphoretic.     Comments: Well-appearing, comfortable, cooperative  HENT:     Head: Normocephalic and atraumatic.  Eyes:     General:        Right eye: No discharge.  Left eye: No discharge.     Conjunctiva/sclera: Conjunctivae normal.  Cardiovascular:     Rate and Rhythm: Normal rate.  Pulmonary:     Effort: Pulmonary effort is normal.  Skin:    General: Skin is warm and dry.     Findings: No erythema or rash.  Neurological:     Mental Status: She is alert and oriented to person, place, and time.     Comments: No baseline tremor  Psychiatric:        Behavior: Behavior normal.     Comments: Well groomed, good eye contact, normal speech and thoughts       Results for orders placed or performed in visit on 01/31/21  Comprehensive metabolic panel  Result Value Ref Range   Sodium 142 135 - 145 mmol/L   Potassium 4.0 3.5 - 5.1 mmol/L   Chloride 108 98 - 111 mmol/L   CO2 25 22 - 32 mmol/L   Glucose, Bld 107 (H) 70 - 99 mg/dL   BUN 18 8 - 23 mg/dL   Creatinine, Ser 0.79 0.44 - 1.00 mg/dL   Calcium 8.8 (L) 8.9  - 10.3 mg/dL   Total Protein 6.7 6.5 - 8.1 g/dL   Albumin 3.9 3.5 - 5.0 g/dL   AST 25 15 - 41 U/L   ALT 25 0 - 44 U/L   Alkaline Phosphatase 55 38 - 126 U/L   Total Bilirubin 0.7 0.3 - 1.2 mg/dL   GFR, Estimated >60 >60 mL/min   Anion gap 9 5 - 15  CBC with Differential  Result Value Ref Range   WBC 5.7 4.0 - 10.5 K/uL   RBC 3.89 3.87 - 5.11 MIL/uL   Hemoglobin 12.8 12.0 - 15.0 g/dL   HCT 38.9 36.0 - 46.0 %   MCV 100.0 80.0 - 100.0 fL   MCH 32.9 26.0 - 34.0 pg   MCHC 32.9 30.0 - 36.0 g/dL   RDW 12.1 11.5 - 15.5 %   Platelets 196 150 - 400 K/uL   nRBC 0.0 0.0 - 0.2 %   Neutrophils Relative % 63 %   Neutro Abs 3.6 1.7 - 7.7 K/uL   Lymphocytes Relative 25 %   Lymphs Abs 1.5 0.7 - 4.0 K/uL   Monocytes Relative 9 %   Monocytes Absolute 0.5 0.1 - 1.0 K/uL   Eosinophils Relative 2 %   Eosinophils Absolute 0.1 0.0 - 0.5 K/uL   Basophils Relative 1 %   Basophils Absolute 0.0 0.0 - 0.1 K/uL   Immature Granulocytes 0 %   Abs Immature Granulocytes 0.02 0.00 - 0.07 K/uL      Assessment & Plan:   Problem List Items Addressed This Visit    Recurrent major depressive episodes, mild (HCC) - Primary   Relevant Medications   sertraline (ZOLOFT) 50 MG tablet   Insomnia   GAD (generalized anxiety disorder)   Relevant Medications   sertraline (ZOLOFT) 50 MG tablet   Drug-induced peripheral neuropathy (HCC)   Relevant Medications   sertraline (ZOLOFT) 50 MG tablet   benztropine (COGENTIN) 1 MG tablet    Other Visit Diagnoses    Occasional tremors       Relevant Medications   benztropine (COGENTIN) 1 MG tablet   Chronic frontal sinusitis       Relevant Medications   cephALEXin (KEFLEX) 500 MG capsule   amoxicillin (AMOXIL) 500 MG capsule   Other Relevant Orders   Ambulatory referral to ENT      Intentional Weight loss  Successful still with improved wt loss based on lower calorie diet < 200 lbs now doing well, she feels better  Major Depression recurrent  mild GAD Insomnia Significant improvement overall mood and sleep, on SSRI Sertraline 50mg  dosage, had side effect at 100mg  with hand tremors Rarely on Trazodone She has had similar problem previously w/ tremors Will add medication for tremors - Benztropine for PRN use for now can use more regularly 1mg  BID - if unsuccessful would recommend referral to Neurology for consultation  ------  Chronic Sinusitis Failed conservative therapy, allergy therapy and sinusitis antibiotic in past. Already on antibiotic now Referral to [place] [facility]  Meds ordered this encounter  Medications  . sertraline (ZOLOFT) 50 MG tablet    Sig: Take 1 tablet (50 mg total) by mouth daily.    Dispense:  90 tablet    Refill:  1  . benztropine (COGENTIN) 1 MG tablet    Sig: Take 1 tablet (1 mg total) by mouth 2 (two) times daily as needed for tremors.    Dispense:  60 tablet    Refill:  3      Follow up plan: Return in about 3 months (around 06/30/2021) for 3 month follow-up Mood PHQ, Insomnia, Hand shaking.   Nobie Putnam, [facility] 03/30/2021, 2:37 PM

## 2021-04-08 ENCOUNTER — Other Ambulatory Visit: Payer: Self-pay | Admitting: Oncology

## 2021-04-26 ENCOUNTER — Other Ambulatory Visit: Payer: Self-pay | Admitting: Family Medicine

## 2021-04-26 DIAGNOSIS — F330 Major depressive disorder, recurrent, mild: Secondary | ICD-10-CM

## 2021-04-26 DIAGNOSIS — F411 Generalized anxiety disorder: Secondary | ICD-10-CM

## 2021-04-28 DIAGNOSIS — J342 Deviated nasal septum: Secondary | ICD-10-CM | POA: Diagnosis not present

## 2021-04-28 DIAGNOSIS — J301 Allergic rhinitis due to pollen: Secondary | ICD-10-CM | POA: Diagnosis not present

## 2021-05-24 ENCOUNTER — Telehealth: Payer: Self-pay | Admitting: Family Medicine

## 2021-05-24 NOTE — Telephone Encounter (Signed)
Copied from [place] [id number]. Topic: Medicare AWV >> May 24, 2021 12:32 PM Cher Nakai R wrote: Reason for CRM:  Left message for patient to call back and schedule Medicare Annual Wellness Visit (AWV) to be done virtually or by telephone.  No hx of AWV eligible as of  10/30/2009 awvi  Please schedule at anytime with [facility].      40 Minutes appointment   Any questions, please call me at [phone]

## 2021-05-25 ENCOUNTER — Other Ambulatory Visit: Payer: Self-pay | Admitting: Family Medicine

## 2021-05-25 DIAGNOSIS — F411 Generalized anxiety disorder: Secondary | ICD-10-CM

## 2021-05-25 MED ORDER — BUSPIRONE HCL 30 MG PO TABS: 30.0000 mg | ORAL_TABLET | Freq: Every day | ORAL | 0 refills | Status: DC

## 2021-05-25 NOTE — Telephone Encounter (Signed)
Copied from [place] [id number]. Topic: Quick Communication - Rx Refill/Question >> May 25, 2021  3:19 PM Leward Quan A wrote: Medication: busPIRone (BUSPAR) 30 MG tablet   Has the patient contacted their pharmacy? Yes.   (Agent: If no, request that the patient contact the pharmacy for the refill.) (Agent: If yes, when and what did the pharmacy advise?)  Preferred Pharmacy (with phone number or street name): [facility] [id number], [place]  Phone:  [phone] Fax:  [phone]     Agent: Please be advised that RX refills may take up to 3 business days. We ask that you follow-up with your pharmacy.

## 2021-06-12 ENCOUNTER — Other Ambulatory Visit: Payer: Self-pay | Admitting: Family Medicine

## 2021-06-12 DIAGNOSIS — F411 Generalized anxiety disorder: Secondary | ICD-10-CM

## 2021-06-12 NOTE — Telephone Encounter (Signed)
Last RF 05/25/21 #90 toos soon for refill

## 2021-06-16 ENCOUNTER — Other Ambulatory Visit: Payer: Self-pay | Admitting: Family Medicine

## 2021-06-16 DIAGNOSIS — M545 Low back pain, unspecified: Secondary | ICD-10-CM

## 2021-06-16 DIAGNOSIS — M47816 Spondylosis without myelopathy or radiculopathy, lumbar region: Secondary | ICD-10-CM

## 2021-06-16 DIAGNOSIS — F330 Major depressive disorder, recurrent, mild: Secondary | ICD-10-CM

## 2021-06-16 DIAGNOSIS — G8929 Other chronic pain: Secondary | ICD-10-CM

## 2021-06-16 DIAGNOSIS — F411 Generalized anxiety disorder: Secondary | ICD-10-CM

## 2021-06-16 MED ORDER — SERTRALINE HCL 100 MG PO TABS: 100.0000 mg | ORAL_TABLET | Freq: Every day | ORAL | 1 refills | Status: DC

## 2021-06-16 NOTE — Telephone Encounter (Signed)
   Notes to clinic:  Per pharmacy patient is taking 2 tabs daily instead of 1  Review for change is script   Requested Prescriptions  Pending Prescriptions Disp Refills   sertraline (ZOLOFT) 50 MG tablet 90 tablet 1    Sig: Take 1 tablet (50 mg total) by mouth daily.     Psychiatry:  Antidepressants - SSRI Passed - 06/16/2021  1:16 PM      Passed - Completed PHQ-2 or PHQ-9 in the last 360 days      Passed - Valid encounter within last 6 months    Recent Outpatient Visits           2 months ago Recurrent major depressive episodes, mild [facility])   [place], DO   5 months ago Primary insomnia   [place], DO   8 months ago Drug-induced peripheral neuropathy [facility])   [facility] [place], Devonne Doughty, DO       Future Appointments             In 2 weeks Parks [place], Devonne Doughty, [facility], [facility]

## 2021-06-16 NOTE — Telephone Encounter (Signed)
Requested medication (s) are due for refill today: yes  Requested medication (s) are on the active medication list: yes  Last refill: 03/19/2021  Future visit scheduled: yes   Notes to clinic:  this refill cannot be delegated    Requested Prescriptions  Pending Prescriptions Disp Refills   tizanidine (ZANAFLEX) 2 MG capsule [Pharmacy Med Name: tiZANidine HCL 2 MG CAPSULE] 360 capsule 1    Sig: TAKE 1 TO 2 CAPSULES BY MOUTH TWO TIMES A DAY AS NEEDED FOR MUSCLE SPASMS     Not Delegated - Cardiovascular:  Alpha-2 Agonists - tizanidine Failed - 06/16/2021 12:54 PM      Failed - This refill cannot be delegated      Passed - Valid encounter within last 6 months    Recent Outpatient Visits           2 months ago Recurrent major depressive episodes, mild ([place])   [place], DO   5 months ago Primary insomnia   [place], DO   8 months ago Drug-induced peripheral neuropathy [facility])   [facility] Parks Ranger, Devonne Doughty, DO       Future Appointments             In 2 weeks Parks Ranger, Devonne Doughty, DO [facility], [facility]

## 2021-06-16 NOTE — Telephone Encounter (Signed)
Medication Refill - Medication: sertraline (ZOLOFT) 50 MG tablet  Has the patient contacted their pharmacy? yes (Agent: If no, request that the patient contact the pharmacy for the refill.) (Agent: If yes, when and what did the pharmacy advise?)contact pcp  Preferred Pharmacy (with phone number or street name):  [facility] [id number], [place] Phone:  [phone]  Fax:  [phone]      Agent: Please be advised that RX refills may take up to 3 business days. We ask that you follow-up with your pharmacy.

## 2021-06-28 ENCOUNTER — Ambulatory Visit
Admission: RE | Admit: 2021-06-28 | Discharge: 2021-06-28 | Disposition: A | Discharge status: Home / Self Care | Payer: Medicare Other | Source: Ambulatory Visit | Attending: Oncology | Admitting: Oncology

## 2021-06-28 ENCOUNTER — Other Ambulatory Visit: Payer: Self-pay

## 2021-06-28 DIAGNOSIS — Z08 Encounter for follow-up examination after completed treatment for malignant neoplasm: Secondary | ICD-10-CM

## 2021-06-28 DIAGNOSIS — M810 Age-related osteoporosis without current pathological fracture: Secondary | ICD-10-CM | POA: Diagnosis not present

## 2021-06-28 DIAGNOSIS — Z1231 Encounter for screening mammogram for malignant neoplasm of breast: Secondary | ICD-10-CM | POA: Diagnosis not present

## 2021-06-28 DIAGNOSIS — M85851 Other specified disorders of bone density and structure, right thigh: Secondary | ICD-10-CM | POA: Diagnosis not present

## 2021-06-28 DIAGNOSIS — M85832 Other specified disorders of bone density and structure, left forearm: Secondary | ICD-10-CM | POA: Diagnosis not present

## 2021-06-28 DIAGNOSIS — Z853 Personal history of malignant neoplasm of breast: Secondary | ICD-10-CM | POA: Diagnosis not present

## 2021-06-30 ENCOUNTER — Inpatient Hospital Stay: Payer: Medicare Other | Attending: Oncology | Admitting: Oncology

## 2021-06-30 ENCOUNTER — Encounter: Payer: Self-pay | Admitting: Oncology

## 2021-06-30 VITALS — BP 122/72 | HR 52 | Temp 98.0°F | Resp 20 | Wt 188.0 lb

## 2021-06-30 DIAGNOSIS — G8929 Other chronic pain: Secondary | ICD-10-CM | POA: Insufficient documentation

## 2021-06-30 DIAGNOSIS — Z923 Personal history of irradiation: Secondary | ICD-10-CM | POA: Insufficient documentation

## 2021-06-30 DIAGNOSIS — M545 Low back pain, unspecified: Secondary | ICD-10-CM | POA: Diagnosis not present

## 2021-06-30 DIAGNOSIS — Z9011 Acquired absence of right breast and nipple: Secondary | ICD-10-CM | POA: Diagnosis not present

## 2021-06-30 DIAGNOSIS — Z08 Encounter for follow-up examination after completed treatment for malignant neoplasm: Secondary | ICD-10-CM | POA: Diagnosis not present

## 2021-06-30 DIAGNOSIS — M810 Age-related osteoporosis without current pathological fracture: Secondary | ICD-10-CM | POA: Insufficient documentation

## 2021-06-30 DIAGNOSIS — Z853 Personal history of malignant neoplasm of breast: Secondary | ICD-10-CM | POA: Insufficient documentation

## 2021-06-30 NOTE — Progress Notes (Signed)
Hematology/Oncology Consult note [facility]  [phone] [phone]  Patient Care Team: Olin Hauser, DO as PCP - General (Family Medicine)   Name of the patient: Heidi [name]  [id number]  04/03/1941   Date of visit: 07/05/21  Diagnosis- stage II left breast cancer ER/PR positive and HER-2/neu negative  Chief complaint/ Reason for visit-routine follow-up of breast cancer  Heme/Onc history:  Oncology History Overview Note  # March 2016- LEFT BREAST CANCER STAGE II ER- 90%; PR-90%; Her 2 neu- NEG; NEO-ADJ CHEMO-  AC x4- Taxol x12 w;;s/p LUMPEC & SLNBx [ypT1 [1.5cm]; ypsN 3/6 (2 macro & 1 micro met)]  s/p RT [Nov 1st week;finish] NOV 2016- START FEMARA stop Feb 2017; April 2017- start Arimidex  # Right Breast cancer ? Stage;  s/p Mastec [1990;s/p Chemo; RT ]  # PN G-2 on neurontin; Hx PE on eliquis  # Mediport   Cancer of overlapping sites of left female breast ([place]) (Resolved)  06/09/2016 Initial Diagnosis   Cancer of overlapping sites of left female breast [facility])    Patient completed 5 years of Arimidex in 2021 November  Interval history- She continues to actively try ato lose weight. She is more active and eating 1200 calories/day. She is no longer taking Armidex. She completed 5 years November 2021. Has chronic low back pain.   ECOG PS- 2 Pain scale- 3 Opioid associated constipation- no  Review of systems- Review of Systems  Constitutional:  Positive for weight loss. Negative for chills, fever and malaise/fatigue.  HENT:  Negative for congestion, ear pain and tinnitus.   Eyes: Negative.  Negative for blurred vision and double vision.  Respiratory: Negative.  Negative for cough, sputum production and shortness of breath.   Cardiovascular: Negative.  Negative for chest pain, palpitations and leg swelling.  Gastrointestinal: Negative.  Negative for abdominal pain, constipation, diarrhea, nausea and vomiting.   Genitourinary:  Negative for dysuria, frequency and urgency.  Musculoskeletal:  Positive for back pain. Negative for falls.  Skin: Negative.  Negative for rash.  Neurological: Negative.  Negative for weakness and headaches.  Endo/Heme/Allergies: Negative.  Does not bruise/bleed easily.  Psychiatric/Behavioral: Negative.  Negative for depression. The patient is not nervous/anxious and does not have insomnia.       Allergies  Allergen Reactions   Other Anaphylaxis    Uncoded Allergy. Allergen: HYDROCHLORLIC ACID- causes water blisters   Diphenhydramine Other (See Comments)    pt states makes legs very restless and ache   Hydrochloric Acid Hives    Other reaction(s): Blisters Uncoded Allergy. Allergen: HYDROCHLORLIC ACID.     Past Medical History:  Diagnosis Date   Anemia    Anxiety    Breast cancer ([place]) 1990   RT MASTECTOMY   C. difficile diarrhea    see dr Vira Agar and this is her second course of vancomycin   Cataracts, bilateral    Depression    Dyspnea    with exersion   Hepatitis 1960's   does not know which type   Hyperlipidemia    Hypertension    Insomnia    Neuropathy due to chemotherapeutic drug ([place])    Osteoarthritis    Osteoporosis    Personal history of radiation therapy 2016   LEFT lumpectomy     Past Surgical History:  Procedure Laterality Date   APPENDECTOMY  1970's   BREAST BIOPSY Left 05/07/2015   Procedure: BREAST BIOPSY WITH NEEDLE LOCALIZATION;  Surgeon: Dia Crawford III, MD;  Location: [facility]  ORS;  Service: General;  Laterality: Left;   BREAST LUMPECTOMY Left 2016   BREAST LUMPECTOMY WITH SENTINEL LYMPH NODE BIOPSY Left 05/07/2015   Procedure: PARTIAL MASTECTOMY WITH SENTINEL LYMPH NODE BX;  Surgeon: Dia Crawford III, MD;  Location: [facility] ORS;  Service: General;  Laterality: Left;   BREAST SURGERY Right 1990   mastectomy   EYE SURGERY Bilateral    cataract extractions   MASTECTOMY Right    1990's   PORT-A-CATH REMOVAL Right 03/07/2017   Procedure:  REMOVAL PORT-A-CATH;  Surgeon: Vickie Epley, MD;  Location: [facility] ORS;  Service: General;  Laterality: Right;    Social History   Socioeconomic History   Marital status: Divorced    Spouse name: Not on file   Number of children: Not on file   Years of education: Not on file   Highest education level: Not on file  Occupational History   Not on file  Tobacco Use   Smoking status: Former    Packs/day: 2.00    Years: 25.00    Pack years: 50.00    Types: Cigarettes    Quit date: 04/28/1992    Years since quitting: 29.2   Smokeless tobacco: Former   Tobacco comments:    Pt quit 1995  Vaping Use   Vaping Use: Never used  Substance and Sexual Activity   Alcohol use: No    Alcohol/week: 0.0 standard drinks   Drug use: No   Sexual activity: Never  Other Topics Concern   Not on file  Social History Narrative   Not on file   Social Determinants of Health   Financial Resource Strain: Not on file  Food Insecurity: Not on file  Transportation Needs: Not on file  Physical Activity: Not on file  Stress: Not on file  Social Connections: Not on file  Intimate Partner Violence: Not on file    Family History  Problem Relation Age of Onset   Dementia Mother    Bladder Cancer Father    Diabetes Sister    Breast cancer Neg Hx      Current Outpatient Medications:    alendronate (FOSAMAX) 70 MG tablet, TAKE 1 TABLET BY MOUTH ONCE WEEKLY ON AN EMPTY STOMACH BEFORE BREAKFAST. REMAIN UPRIGHT FOR 30 MINUTES AND TAKE WITH 8 OUNCES OF WATER, Disp: 12 tablet, Rfl: 0   benztropine (COGENTIN) 1 MG tablet, Take 1 tablet (1 mg total) by mouth 2 (two) times daily as needed for tremors., Disp: 60 tablet, Rfl: 3   BucAlfAspKGlucCouchParsUvaUrJu (WATER PILLS PO), Take 1-2 tablets by mouth daily as needed (for fluid retention)., Disp: , Rfl:    busPIRone (BUSPAR) 30 MG tablet, Take 1 tablet (30 mg total) by mouth daily., Disp: 90 tablet, Rfl: 0   Calcium Carb-Cholecalciferol (CALCIUM 600 + D  PO), Take 1 tablet by mouth 2 (two) times a day., Disp: , Rfl:    COVID-19 mRNA Vac-TriS, Pfizer, (PFIZER-BIONT COVID-19 VAC-TRIS) SUSP injection, Inject into the muscle., Disp: 0.3 mL, Rfl: 0   losartan (COZAAR) 100 MG tablet, TAKE ONE TABLET BY MOUTH DAILY, Disp: 90 tablet, Rfl: 0   Multiple Vitamin (MULTIVITAMIN WITH MINERALS) TABS tablet, Take 1 tablet by mouth at bedtime. Senior Multivitamin, Disp: , Rfl:    NON FORMULARY, Take 2 capsules by mouth daily. Dynamic brain, Disp: , Rfl:    Red Yeast Rice 600 MG CAPS, Take 2 capsules by mouth daily., Disp: , Rfl:    sertraline (ZOLOFT) 100 MG tablet, Take 1 tablet (100 mg total)  by mouth daily., Disp: 90 tablet, Rfl: 1   tizanidine (ZANAFLEX) 2 MG capsule, TAKE 1 TO 2 CAPSULES BY MOUTH TWO TIMES A DAY AS NEEDED FOR MUSCLE SPASMS, Disp: 360 capsule, Rfl: 1   traZODone (DESYREL) 100 MG tablet, Take 1 tablet (100 mg total) by mouth at bedtime as needed for sleep. Dose adjust according to insomnia symptom, Disp: 90 tablet, Rfl: 1   Turmeric 400 MG CAPS, Take 1 capsule by mouth 2 (two) times daily., Disp: , Rfl:    amoxicillin (AMOXIL) 500 MG capsule, Take 500 mg by mouth 3 (three) times daily., Disp: , Rfl:    cephALEXin (KEFLEX) 500 MG capsule, Take 500 mg by mouth 3 (three) times daily., Disp: , Rfl:    ipratropium (ATROVENT) 0.06 % nasal spray, Place 2 sprays into both nostrils 4 (four) times daily. As needed for nasal congestion, Disp: 15 mL, Rfl: 1  Physical exam:  Vitals:   06/30/21 1403  BP: 122/72  Pulse: (!) 52  Resp: 20  Temp: 98 F (36.7 C)  SpO2: 100%  Weight: 188 lb (85.3 kg)   Physical Exam Constitutional:      Appearance: Normal appearance.  HENT:     Head: Normocephalic and atraumatic.  Eyes:     Pupils: Pupils are equal, round, and reactive to light.  Cardiovascular:     Rate and Rhythm: Normal rate and regular rhythm.     Heart sounds: Normal heart sounds. No murmur heard. Pulmonary:     Effort: Pulmonary effort is  normal.     Breath sounds: Normal breath sounds. No wheezing.  Abdominal:     General: Bowel sounds are normal. There is no distension.     Palpations: Abdomen is soft.     Tenderness: There is no abdominal tenderness.  Musculoskeletal:        General: Normal range of motion.     Cervical back: Normal range of motion.  Skin:    General: Skin is warm and dry.     Findings: No rash.  Neurological:     Mental Status: She is alert and oriented to person, place, and time.  Psychiatric:        Judgment: Judgment normal.      CMP Latest Ref Rng & Units 01/31/2021  Glucose 70 - 99 mg/dL 107(H)  BUN 8 - 23 mg/dL 18  Creatinine 0.44 - 1.00 mg/dL 0.79  Sodium 135 - 145 mmol/L 142  Potassium 3.5 - 5.1 mmol/L 4.0  Chloride 98 - 111 mmol/L 108  CO2 22 - 32 mmol/L 25  Calcium 8.9 - 10.3 mg/dL 8.8(L)  Total Protein 6.5 - 8.1 g/dL 6.7  Total Bilirubin 0.3 - 1.2 mg/dL 0.7  Alkaline Phos 38 - 126 U/L 55  AST 15 - 41 U/L 25  ALT 0 - 44 U/L 25   CBC Latest Ref Rng & Units 01/31/2021  WBC 4.0 - 10.5 K/uL 5.7  Hemoglobin 12.0 - 15.0 g/dL 12.8  Hematocrit 36.0 - 46.0 % 38.9  Platelets 150 - 400 K/uL 196     Assessment and plan- Patient is a 80 y.o. female with history of left breast cancer stage I status postlumpectomy, adjuvant XRT and 5 years of Arimidex which she completed in November 2021.  She also had a right mastectomy.  Clinically, she is doing well with no concerning signs or symptoms of recurrence based on exam today.  She has completed a total of 5 years of Arimidex.  Mammogram from 06/17/2020 was  read as BI-RADS Category 2 benign and will repeat in 1 year.  Bone density scan showed a T score of -2.4 which is an improvement from previous 2 years ago.  Continue weightbearing exercises along with calcium and vitamin D.  RTC in 1 year for follow-up and to review mammogram.  Weight loss-over the past year she has lost about 45 pounds.  This was intentional and she is currently following a  1200-calorie diet daily.  She is able to be more active since her weight loss.  I spent 25 minutes dedicated to the care of this patient (face-to-face and non-face-to-face) on the date of the encounter to include what is described in the assessment and plan.   Visit Diagnosis 1. Encounter for follow-up surveillance of breast cancer   2. Osteoporosis without current pathological fracture, unspecified osteoporosis type     Faythe Casa, NP 07/05/2021 12:29 PM

## 2021-07-06 ENCOUNTER — Ambulatory Visit (INDEPENDENT_AMBULATORY_CARE_PROVIDER_SITE_OTHER): Payer: Medicare Other | Admitting: Family Medicine

## 2021-07-06 ENCOUNTER — Other Ambulatory Visit: Payer: Self-pay

## 2021-07-06 ENCOUNTER — Encounter: Payer: Self-pay | Admitting: Family Medicine

## 2021-07-06 VITALS — BP 117/58 | HR 82 | Ht 64.0 in | Wt 191.2 lb

## 2021-07-06 DIAGNOSIS — I10 Essential (primary) hypertension: Secondary | ICD-10-CM | POA: Diagnosis not present

## 2021-07-06 DIAGNOSIS — G8929 Other chronic pain: Secondary | ICD-10-CM | POA: Diagnosis not present

## 2021-07-06 DIAGNOSIS — M545 Low back pain, unspecified: Secondary | ICD-10-CM

## 2021-07-06 DIAGNOSIS — M47816 Spondylosis without myelopathy or radiculopathy, lumbar region: Secondary | ICD-10-CM | POA: Diagnosis not present

## 2021-07-06 DIAGNOSIS — F330 Major depressive disorder, recurrent, mild: Secondary | ICD-10-CM | POA: Diagnosis not present

## 2021-07-06 DIAGNOSIS — Z23 Encounter for immunization: Secondary | ICD-10-CM

## 2021-07-06 DIAGNOSIS — R7303 Prediabetes: Secondary | ICD-10-CM | POA: Diagnosis not present

## 2021-07-06 DIAGNOSIS — F411 Generalized anxiety disorder: Secondary | ICD-10-CM

## 2021-07-06 MED ORDER — LOSARTAN POTASSIUM 100 MG PO TABS: 100.0000 mg | ORAL_TABLET | Freq: Every day | ORAL | 3 refills | Status: DC

## 2021-07-06 MED ORDER — DICLOFENAC SODIUM 1 % EX GEL: 2.0000 g | Freq: Three times a day (TID) | CUTANEOUS | 3 refills | Status: DC | PRN

## 2021-07-06 MED ORDER — SHINGRIX 50 MCG/0.5ML IM SUSR
INTRAMUSCULAR | 1 refills | Status: DC
Start: 2021-07-06 — End: 2022-07-26

## 2021-07-06 MED ORDER — BUSPIRONE HCL 30 MG PO TABS: 30.0000 mg | ORAL_TABLET | Freq: Every day | ORAL | 3 refills | Status: DC

## 2021-07-06 NOTE — Patient Instructions (Addendum)
Thank you for coming to the office today.  Can get a High Dose Flu Shot here in the Fall/Winter or at Pharmacy.  No pneumonia shot needed  I recommend the Shingrix vaccines (new one) when ready at Pharmacy.  Recommend to start taking Tylenol Extra Strength '500mg'$  tabs - take 1 to 2 tabs per dose (max '1000mg'$ ) every 6-8 hours for pain (take regularly, don't skip a dose for next 7 days), max 24 hour daily dose is 6 tablets or '3000mg'$ . In the future you can repeat the same everyday Tylenol course for 1-2 weeks at a time.   START anti inflammatory topical - rx or OTC Voltaren (generic Diclofenac) topical 2-4 times a day as needed for pain swelling of affected joint for 1-2 weeks or longer.   If not working we can consider calling in a Tramadol pain  medication in future.  Please schedule a Follow-up Appointment to: Return in about 6 months (around 01/03/2022) for 6 month follow-up HTN, Back Pain, Mood PHQ / Insomnia.  If you have any other questions or concerns, please feel free to call the office or send a message through [place]. You may also schedule an earlier appointment if necessary.  Additionally, you may be receiving a survey about your experience at our office within a few days to 1 week by e-mail or mail. We value your feedback.  Nobie Putnam, DO [place]

## 2021-07-06 NOTE — Progress Notes (Signed)
Subjective:    Patient ID: Heidi [name], female    DOB: 1941/05/01, 80 y.o.   MRN: [id number]  Heidi [name] is a 80 y.o. female presenting on 07/06/2021 for Insomnia and Back Pain   HPI  Chronic Low Back Pain Knee Osteoarthritis Failed Meloxicam, Gabapentin. Asks about other treatment options   Recurrent Depression, mild chronic Generalized Anxiety Disorder Chronic problems, seem episodic, can be related to health and stress as well  On Sertraline '100mg'$  daily with improvement and Buspar '30mg'$  daily   Previously on Sertraline, Prozac, Cymbalta. Since age 27s. No longer hand shaking from Cymbalta.    - Taking Tizanidine for sleep with improvement, taking '2mg'$  x 2 dose in afternoon or PM to help.   Elevated BP Admits sedentary not as active.  Updated on hair loss - has improved.   Health Maintenance: She asks about Shingrix vaccine at pharmacy, she had Zostavax 12 years ago.  Depression screen [facility] 2/9 07/06/2021 03/30/2021 12/23/2020  Decreased Interest 0 0 1  Down, Depressed, Hopeless 0 0 0  PHQ - 2 Score 0 0 1  Altered sleeping 0 0 2  Tired, decreased energy 0 1 1  Change in appetite 0 0 0  Feeling bad or failure about yourself  0 0 0  Trouble concentrating 0 1 1  Moving slowly or fidgety/restless 0 0 0  Suicidal thoughts 0 0 0  PHQ-9 Score 0 2 5  Difficult doing work/chores Not difficult at all Not difficult at all Very difficult   GAD 7 : Generalized Anxiety Score 07/06/2021 03/30/2021 09/21/2020  Nervous, Anxious, on Edge '1 1 2  '$ Control/stop worrying 0 1 0  Worry too much - different things 0 1 1  Trouble relaxing 0 0 1  Restless 0 0 0  Easily annoyed or irritable 0 0 0  Afraid - awful might happen 0 0 0  Total GAD 7 Score '1 3 4  '$ Anxiety Difficulty Not difficult at all Somewhat difficult Somewhat difficult      Social History   Tobacco Use   Smoking status: Former    Packs/day: 2.00    Years: 25.00    Pack years: 50.00    Types: Cigarettes    Quit date:  04/28/1992    Years since quitting: 29.2   Smokeless tobacco: Former   Tobacco comments:    Pt quit 1995  Vaping Use   Vaping Use: Never used  Substance Use Topics   Alcohol use: No    Alcohol/week: 0.0 standard drinks   Drug use: No    Review of Systems Per HPI unless specifically indicated above     Objective:    BP (!) 117/58   Pulse 82   Ht '5\' 4"'$  (1.626 m)   Wt 191 lb 3.2 oz (86.7 kg)   SpO2 97%   BMI 32.82 kg/m   Wt Readings from Last 3 Encounters:  07/06/21 191 lb 3.2 oz (86.7 kg)  06/30/21 188 lb (85.3 kg)  03/30/21 198 lb (89.8 kg)    Physical Exam Vitals and nursing note reviewed.  Constitutional:      General: She is not in acute distress.    Appearance: She is well-developed. She is not diaphoretic.     Comments: Well-appearing, comfortable, cooperative  HENT:     Head: Normocephalic and atraumatic.  Eyes:     General:        Right eye: No discharge.        Left eye: No  discharge.     Conjunctiva/sclera: Conjunctivae normal.  Neck:     Thyroid: No thyromegaly.  Cardiovascular:     Rate and Rhythm: Normal rate and regular rhythm.     Heart sounds: Normal heart sounds. No murmur heard. Pulmonary:     Effort: Pulmonary effort is normal. No respiratory distress.     Breath sounds: Normal breath sounds. No wheezing or rales.  Musculoskeletal:        General: Normal range of motion.     Cervical back: Normal range of motion and neck supple.  Lymphadenopathy:     Cervical: No cervical adenopathy.  Skin:    General: Skin is warm and dry.     Findings: No erythema or rash.  Neurological:     Mental Status: She is alert and oriented to person, place, and time.  Psychiatric:        Behavior: Behavior normal.     Comments: Well groomed, good eye contact, normal speech and thoughts     Results for orders placed or performed in visit on 01/31/21  Comprehensive metabolic panel  Result Value Ref Range   Sodium 142 135 - 145 mmol/L   Potassium 4.0 3.5  - 5.1 mmol/L   Chloride 108 98 - 111 mmol/L   CO2 25 22 - 32 mmol/L   Glucose, Bld 107 (H) 70 - 99 mg/dL   BUN 18 8 - 23 mg/dL   Creatinine, Ser 0.79 0.44 - 1.00 mg/dL   Calcium 8.8 (L) 8.9 - 10.3 mg/dL   Total Protein 6.7 6.5 - 8.1 g/dL   Albumin 3.9 3.5 - 5.0 g/dL   AST 25 15 - 41 U/L   ALT 25 0 - 44 U/L   Alkaline Phosphatase 55 38 - 126 U/L   Total Bilirubin 0.7 0.3 - 1.2 mg/dL   GFR, Estimated >60 >60 mL/min   Anion gap 9 5 - 15  CBC with Differential  Result Value Ref Range   WBC 5.7 4.0 - 10.5 K/uL   RBC 3.89 3.87 - 5.11 MIL/uL   Hemoglobin 12.8 12.0 - 15.0 g/dL   HCT 38.9 36.0 - 46.0 %   MCV 100.0 80.0 - 100.0 fL   MCH 32.9 26.0 - 34.0 pg   MCHC 32.9 30.0 - 36.0 g/dL   RDW 12.1 11.5 - 15.5 %   Platelets 196 150 - 400 K/uL   nRBC 0.0 0.0 - 0.2 %   Neutrophils Relative % 63 %   Neutro Abs 3.6 1.7 - 7.7 K/uL   Lymphocytes Relative 25 %   Lymphs Abs 1.5 0.7 - 4.0 K/uL   Monocytes Relative 9 %   Monocytes Absolute 0.5 0.1 - 1.0 K/uL   Eosinophils Relative 2 %   Eosinophils Absolute 0.1 0.0 - 0.5 K/uL   Basophils Relative 1 %   Basophils Absolute 0.0 0.0 - 0.1 K/uL   Immature Granulocytes 0 %   Abs Immature Granulocytes 0.02 0.00 - 0.07 K/uL      Assessment & Plan:   Problem List Items Addressed This Visit     Recurrent major depressive episodes, mild (HCC)   Relevant Medications   busPIRone (BUSPAR) 30 MG tablet   Prediabetes   GAD (generalized anxiety disorder)   Relevant Medications   busPIRone (BUSPAR) 30 MG tablet   Essential hypertension - Primary   Relevant Medications   losartan (COZAAR) 100 MG tablet   Back pain   Relevant Medications   diclofenac Sodium (VOLTAREN) 1 % GEL  Other Visit Diagnoses     Spondylosis of lumbar region without myelopathy or radiculopathy       Relevant Medications   diclofenac Sodium (VOLTAREN) 1 % GEL   Need for shingles vaccine       Relevant Medications   SHINGRIX injection       Major Depression  recurrent GAD  Controlled Continue Buspar, Sertraline  Shingrix vaccine printed to take to pharmacy  Back Pain, chronic OA DJD Lumbar Trial on topical Voltaren, ice/heat PRN She is on Tizanidine Failed other meds NSAID Gabapentin, limited options left Consider Tramadol PRN in future, defer opioids for now.  HTN Controlled Refill Losartan   No orders of the defined types were placed in this encounter.   Meds ordered this encounter  Medications   SHINGRIX injection    Sig: Inject 0.5 mL into muscle for shingles vaccine. Repeat dose in 2-6 months.    Dispense:  0.5 mL    Refill:  1   losartan (COZAAR) 100 MG tablet    Sig: Take 1 tablet (100 mg total) by mouth daily.    Dispense:  90 tablet    Refill:  3   diclofenac Sodium (VOLTAREN) 1 % GEL    Sig: Apply 2 g topically 3 (three) times daily as needed (arthritis pain).    Dispense:  100 g    Refill:  3   busPIRone (BUSPAR) 30 MG tablet    Sig: Take 1 tablet (30 mg total) by mouth daily.    Dispense:  90 tablet    Refill:  3    Add refills     Follow up plan: Return in about 6 months (around 01/03/2022) for 6 month follow-up HTN, Back Pain, Mood PHQ / Insomnia.  Nobie Putnam, [facility] 07/06/2021, 2:47 PM

## 2021-07-07 ENCOUNTER — Other Ambulatory Visit: Payer: Self-pay | Admitting: Oncology

## 2021-07-08 ENCOUNTER — Encounter: Payer: Self-pay | Admitting: Family Medicine

## 2021-09-01 ENCOUNTER — Encounter: Payer: Self-pay | Admitting: Oncology

## 2021-09-01 NOTE — Telephone Encounter (Signed)
I called the dentist office and gave cell phone for Heidi [name]. Told them she is off today but to reach out when able

## 2021-09-01 NOTE — Telephone Encounter (Signed)
I called the pt and she states that dentist is Dr/. Marc Morgans. Phone # [phone].  Patient states that the office is closed on wed. And fri.

## 2021-09-12 NOTE — Telephone Encounter (Signed)
I called the dentist again  and the person on the phone says she did give the dentist Dr. Elroy Channel cell phone but Dr. Mina Marble may have forgotten and will send it to her again. I gave Janese Banks cell phone just in case they could not find it

## 2021-09-15 ENCOUNTER — Encounter: Payer: Self-pay | Admitting: Oncology

## 2021-09-16 NOTE — Telephone Encounter (Signed)
Dr Janese Banks spoke to Dr. Mina Marble and they both feel that it is ok to resume  fosamax. Pt has been sent a message on voicemail and my chart about the this drug fosamax

## 2021-09-25 ENCOUNTER — Other Ambulatory Visit: Payer: Self-pay | Admitting: Oncology

## 2021-12-09 ENCOUNTER — Ambulatory Visit (INDEPENDENT_AMBULATORY_CARE_PROVIDER_SITE_OTHER): Payer: Medicare Other

## 2021-12-09 DIAGNOSIS — Z0000 Encounter for general adult medical examination without abnormal findings: Secondary | ICD-10-CM

## 2021-12-09 NOTE — Patient Instructions (Addendum)
Heidi [name] , Thank you for taking time to come for your Medicare Wellness Visit. I appreciate your ongoing commitment to your health goals. Please review the following plan we discussed and let me know if I can assist you in the future.   Screening recommendations/referrals: Colonoscopy: aged out Mammogram: due in August Bone Density: 06/28/21, due in 2 years Recommended yearly ophthalmology/optometry visit for glaucoma screening and checkup Recommended yearly dental visit for hygiene and checkup  Vaccinations: Influenza vaccine: had at local pharmacy Pneumococcal vaccine: 04/21/19 Tdap vaccine: 08/01/11, due Shingles vaccine: Zostavax 08/03/10   Covid-19:11/20/19, 12/11/19, 06/22/20, 03/11/21  Advanced directives: no  Conditions/risks identified: none  Next appointment: Follow up in one year for your annual wellness visit - 12/15/22 @ 2:40pm by phone   Preventive Care 65 Years and Older, Female Preventive care refers to lifestyle choices and visits with your health care provider that can promote health and wellness. What does preventive care include? A yearly physical exam. This is also called an annual well check. Dental exams once or twice a year. Routine eye exams. Ask your health care provider how often you should have your eyes checked. Personal lifestyle choices, including: Daily care of your teeth and gums. Regular physical activity. Eating a healthy diet. Avoiding tobacco and drug use. Limiting alcohol use. Practicing safe sex. Taking low-dose aspirin every day. Taking vitamin and mineral supplements as recommended by your health care provider. What happens during an annual well check? The services and screenings done by your health care provider during your annual well check will depend on your age, overall health, lifestyle risk factors, and family history of disease. Counseling  Your health care provider may ask you questions about your: Alcohol use. Tobacco use. Drug  use. Emotional well-being. Home and relationship well-being. Sexual activity. Eating habits. History of falls. Memory and ability to understand (cognition). Work and work Statistician. Reproductive health. Screening  You may have the following tests or measurements: Height, weight, and BMI. Blood pressure. Lipid and cholesterol levels. These may be checked every 5 years, or more frequently if you are over 80 years old. Skin check. Lung cancer screening. You may have this screening every year starting at age 80 if you have a 30-pack-year history of smoking and currently smoke or have quit within the past 15 years. Fecal occult blood test (FOBT) of the stool. You may have this test every year starting at age 80. Flexible sigmoidoscopy or colonoscopy. You may have a sigmoidoscopy every 5 years or a colonoscopy every 10 years starting at age 80. Hepatitis C blood test. Hepatitis B blood test. Sexually transmitted disease (STD) testing. Diabetes screening. This is done by checking your blood sugar (glucose) after you have not eaten for a while (fasting). You may have this done every 1-3 years. Bone density scan. This is done to screen for osteoporosis. You may have this done starting at age 80. Mammogram. This may be done every 1-2 years. Talk to your health care provider about how often you should have regular mammograms. Talk with your health care provider about your test results, treatment options, and if necessary, the need for more tests. Vaccines  Your health care provider may recommend certain vaccines, such as: Influenza vaccine. This is recommended every year. Tetanus, diphtheria, and acellular pertussis (Tdap, Td) vaccine. You may need a Td booster every 10 years. Zoster vaccine. You may need this after age 80. Pneumococcal 13-valent conjugate (PCV13) vaccine. One dose is recommended after age 80. Pneumococcal polysaccharide (PPSV23)  vaccine. One dose is recommended after age  80. Talk to your health care provider about which screenings and vaccines you need and how often you need them. This information is not intended to replace advice given to you by your health care provider. Make sure you discuss any questions you have with your health care provider. Document Released: 11/12/2015 Document Revised: 07/05/2016 Document Reviewed: 08/17/2015 Elsevier Interactive Patient Education  2017 [place] Prevention in the Home Falls can cause injuries. They can happen to people of all ages. There are many things you can do to make your home safe and to help prevent falls. What can I do on the outside of my home? Regularly fix the edges of walkways and driveways and fix any cracks. Remove anything that might make you trip as you walk through a door, such as a raised step or threshold. Trim any bushes or trees on the path to your home. Use bright outdoor lighting. Clear any walking paths of anything that might make someone trip, such as rocks or tools. Regularly check to see if handrails are loose or broken. Make sure that both sides of any steps have handrails. Any raised decks and porches should have guardrails on the edges. Have any leaves, snow, or ice cleared regularly. Use sand or salt on walking paths during winter. Clean up any spills in your garage right away. This includes oil or grease spills. What can I do in the bathroom? Use night lights. Install grab bars by the toilet and in the tub and shower. Do not use towel bars as grab bars. Use non-skid mats or decals in the tub or shower. If you need to sit down in the shower, use a plastic, non-slip stool. Keep the floor dry. Clean up any water that spills on the floor as soon as it happens. Remove soap buildup in the tub or shower regularly. Attach bath mats securely with double-sided non-slip rug tape. Do not have throw rugs and other things on the floor that can make you trip. What can I do in the  bedroom? Use night lights. Make sure that you have a light by your bed that is easy to reach. Do not use any sheets or blankets that are too big for your bed. They should not hang down onto the floor. Have a firm chair that has side arms. You can use this for support while you get dressed. Do not have throw rugs and other things on the floor that can make you trip. What can I do in the kitchen? Clean up any spills right away. Avoid walking on wet floors. Keep items that you use a lot in easy-to-reach places. If you need to reach something above you, use a strong step stool that has a grab bar. Keep electrical cords out of the way. Do not use floor polish or wax that makes floors slippery. If you must use wax, use non-skid floor wax. Do not have throw rugs and other things on the floor that can make you trip. What can I do with my stairs? Do not leave any items on the stairs. Make sure that there are handrails on both sides of the stairs and use them. Fix handrails that are broken or loose. Make sure that handrails are as long as the stairways. Check any carpeting to make sure that it is firmly attached to the stairs. Fix any carpet that is loose or worn. Avoid having throw rugs at the top or bottom  of the stairs. If you do have throw rugs, attach them to the floor with carpet tape. Make sure that you have a light switch at the top of the stairs and the bottom of the stairs. If you do not have them, ask someone to add them for you. What else can I do to help prevent falls? Wear shoes that: Do not have high heels. Have rubber bottoms. Are comfortable and fit you well. Are closed at the toe. Do not wear sandals. If you use a stepladder: Make sure that it is fully opened. Do not climb a closed stepladder. Make sure that both sides of the stepladder are locked into place. Ask someone to hold it for you, if possible. Clearly mark and make sure that you can see: Any grab bars or  handrails. First and last steps. Where the edge of each step is. Use tools that help you move around (mobility aids) if they are needed. These include: Canes. Walkers. Scooters. Crutches. Turn on the lights when you go into a dark area. Replace any light bulbs as soon as they burn out. Set up your furniture so you have a clear path. Avoid moving your furniture around. If any of your floors are uneven, fix them. If there are any pets around you, be aware of where they are. Review your medicines with your doctor. Some medicines can make you feel dizzy. This can increase your chance of falling. Ask your doctor what other things that you can do to help prevent falls. This information is not intended to replace advice given to you by your health care provider. Make sure you discuss any questions you have with your health care provider. Document Released: 08/12/2009 Document Revised: 03/23/2016 Document Reviewed: 11/20/2014 Elsevier Interactive Patient Education  2017 [employer].

## 2021-12-09 NOTE — Progress Notes (Signed)
Virtual Visit via Telephone Note  I connected with  Heidi [name] on 12/09/21 at  2:40 PM EST by telephone and verified that I am speaking with the correct person using two identifiers.  Location: Patient: home Provider: [facility] Persons participating in the virtual visit: [place]   I discussed the limitations, risks, security and privacy concerns of performing an evaluation and management service by telephone and the availability of in person appointments. The patient expressed understanding and agreed to proceed.  Interactive audio and video telecommunications were attempted between this nurse and patient, however failed, due to patient having technical difficulties OR patient did not have access to video capability.  We continued and completed visit with audio only.  Some vital signs may be absent or patient reported.   Heidi David, LPN  Subjective:   Heidi [name] is a 81 y.o. female who presents for Medicare Annual (Subsequent) preventive examination.  Review of Systems           Objective:    There were no vitals filed for this visit. There is no height or weight on file to calculate BMI.  Advanced Directives 06/30/2021 01/31/2021 07/19/2020 01/15/2020 07/11/2019 04/11/2019 10/10/2018  Does Patient Have a Medical Advance Directive? No No No No No No No  Would patient like information on creating a medical advance directive? No - Patient declined No - Patient declined No - Patient declined No - Patient declined - No - Patient declined No - Patient declined    Current Medications (verified) Outpatient Encounter Medications as of 12/09/2021  Medication Sig   ipratropium (ATROVENT) 0.06 % nasal spray Place into the nose.   losartan (COZAAR) 100 MG tablet Take 1 tablet by mouth daily.   tiZANidine (ZANAFLEX) 2 MG tablet Take by mouth.   alendronate (FOSAMAX) 70 MG tablet TAKE 1 TABLET BY MOUTH ONCE WEEKLY ON AN EMPTY STOMACH BEFORE BREAKFAST. REMAIN UPRIGHT FOR 30  MINUTES AND TAKE WITH 8 OUNCES OF WATER   benztropine (COGENTIN) 1 MG tablet Take 1 tablet (1 mg total) by mouth 2 (two) times daily as needed for tremors.   BucAlfAspKGlucCouchParsUvaUrJu (WATER PILLS PO) Take 1-2 tablets by mouth daily as needed (for fluid retention).   busPIRone (BUSPAR) 30 MG tablet Take 1 tablet (30 mg total) by mouth daily.   Calcium Carb-Cholecalciferol (CALCIUM 600 + D PO) Take 1 tablet by mouth 2 (two) times a day.   Calcium Carbonate-Vitamin D 600-5 MG-MCG TABS Take by mouth.   diclofenac Sodium (VOLTAREN) 1 % GEL Apply 2 g topically 3 (three) times daily as needed (arthritis pain).   losartan (COZAAR) 100 MG tablet Take 1 tablet (100 mg total) by mouth daily.   Multiple Vitamin (MULTIVITAMIN WITH MINERALS) TABS tablet Take 1 tablet by mouth at bedtime. Senior Multivitamin   NON FORMULARY Take 2 capsules by mouth daily. Dynamic brain   Omega-3 Fatty Acids (FISH OIL) 1000 MG CAPS Take by mouth.   Red Yeast Rice 600 MG CAPS Take 2 capsules by mouth daily.   sertraline (ZOLOFT) 100 MG tablet Take 1 tablet (100 mg total) by mouth daily.   SHINGRIX injection Inject 0.5 mL into muscle for shingles vaccine. Repeat dose in 2-6 months.   tizanidine (ZANAFLEX) 2 MG capsule TAKE 1 TO 2 CAPSULES BY MOUTH TWO TIMES A DAY AS NEEDED FOR MUSCLE SPASMS   traZODone (DESYREL) 100 MG tablet Take 1 tablet (100 mg total) by mouth at bedtime as needed for sleep. Dose adjust according  to insomnia symptom   Turmeric 400 MG CAPS Take 1 capsule by mouth 2 (two) times daily.   No facility-administered encounter medications on file as of 12/09/2021.    Allergies (verified) Other, Diphenhydramine, and Hydrochloric acid   History: Past Medical History:  Diagnosis Date   Anemia    Anxiety    Breast cancer ([place]) 1990   RT MASTECTOMY   C. difficile diarrhea    see dr Vira Agar and this is her second course of vancomycin   Cataracts, bilateral    Depression    Dyspnea    with exersion    Hepatitis 1960's   does not know which type   Hyperlipidemia    Hypertension    Insomnia    Neuropathy due to chemotherapeutic drug ([place])    Osteoarthritis    Osteoporosis    Personal history of radiation therapy 2016   LEFT lumpectomy   Past Surgical History:  Procedure Laterality Date   APPENDECTOMY  1970's   BREAST BIOPSY Left 05/07/2015   Procedure: BREAST BIOPSY WITH NEEDLE LOCALIZATION;  Surgeon: Dia Crawford III, MD;  Location: [facility] ORS;  Service: General;  Laterality: Left;   BREAST LUMPECTOMY Left 2016   BREAST LUMPECTOMY WITH SENTINEL LYMPH NODE BIOPSY Left 05/07/2015   Procedure: PARTIAL MASTECTOMY WITH SENTINEL LYMPH NODE BX;  Surgeon: Dia Crawford III, MD;  Location: [facility] ORS;  Service: General;  Laterality: Left;   BREAST SURGERY Right 1990   mastectomy   EYE SURGERY Bilateral    cataract extractions   MASTECTOMY Right    1990's   PORT-A-CATH REMOVAL Right 03/07/2017   Procedure: REMOVAL PORT-A-CATH;  Surgeon: Vickie Epley, MD;  Location: [facility] ORS;  Service: General;  Laterality: Right;   Family History  Problem Relation Age of Onset   Dementia Mother    Bladder Cancer Father    Diabetes Sister    Breast cancer Neg Hx    Social History   Socioeconomic History   Marital status: Divorced    Spouse name: Not on file   Number of children: Not on file   Years of education: Not on file   Highest education level: Not on file  Occupational History   Not on file  Tobacco Use   Smoking status: Former    Packs/day: 2.00    Years: 25.00    Pack years: 50.00    Types: Cigarettes    Quit date: 04/28/1992    Years since quitting: 29.6   Smokeless tobacco: Former   Tobacco comments:    Pt quit 1995  Vaping Use   Vaping Use: Never used  Substance and Sexual Activity   Alcohol use: No    Alcohol/week: 0.0 standard drinks   Drug use: No   Sexual activity: Never  Other Topics Concern   Not on file  Social History Narrative   Not on file   Social Determinants of  Health   Financial Resource Strain: Not on file  Food Insecurity: Not on file  Transportation Needs: Not on file  Physical Activity: Not on file  Stress: Not on file  Social Connections: Not on file    Tobacco Counseling Counseling given: Not Answered Tobacco comments: Pt quit 1995   Clinical Intake:  Pre-visit preparation completed: Yes  Pain : No/denies pain     Nutritional Risks: None Diabetes: No  How often do you need to have someone help you when you read instructions, pamphlets, or other written materials from your doctor or pharmacy?: 1 -  Never  Diabetic?no  Interpreter Needed?: No  Information entered by :: Kirke Shaggy, LPN   Activities of Daily Living No flowsheet data found.  Patient Care Team: Olin Hauser, DO as PCP - General (Family Medicine)  Indicate any recent Medical Services you may have received from other than [facility] providers in the past year (date may be approximate).     Assessment:   This is a routine wellness examination for Candie.  Hearing/Vision screen No results found.  Dietary issues and exercise activities discussed:     Goals Addressed   None    Depression Screen PHQ 2/9 Scores 07/06/2021 03/30/2021 12/23/2020 09/21/2020 03/26/2018 03/15/2016 10/06/2015  PHQ - 2 Score 0 0 1 1 0 0 0  PHQ- 9 Score 0 2 5 5  - - -    Fall Risk Fall Risk  03/30/2021 09/21/2020 03/26/2018 03/15/2016 10/06/2015  Falls in the past year? 0 0 No No No  Number falls in past yr: 0 0 - - -  Injury with Fall? 0 0 - - -  Follow up Falls evaluation completed Falls evaluation completed - - -    FALL RISK PREVENTION PERTAINING TO THE HOME:  Any stairs in or around the home? No  If so, are there any without handrails? No  Home free of loose throw rugs in walkways, pet beds, electrical cords, etc? Yes  Adequate lighting in your home to reduce risk of falls? Yes   ASSISTIVE DEVICES UTILIZED TO PREVENT FALLS:  Life alert? No  Use of a cane, walker  or w/c? Yes  Grab bars in the bathroom? Yes  Shower chair or bench in shower? Yes  Elevated toilet seat or a handicapped toilet? No   Cognitive Function:Normal cognitive status assessed by direct observation by this Nurse Health Advisor. No abnormalities found.          Immunizations Immunization History  Administered Date(s) Administered   Fluad Quad(high Dose 65+) 09/21/2020   Influenza Inj Mdck Quad Pf 07/26/2018   Influenza, High Dose Seasonal PF 08/23/2017, 09/23/2019   Influenza,inj,Quad PF,6+ Mos 07/23/2014, 08/04/2015   Influenza-Unspecified 07/14/2010, 07/30/2012, 07/23/2014, 08/22/2014, 08/04/2015, 07/30/2016, 08/31/2019   PFIZER Comirnaty(Gray Top)Covid-19 Tri-Sucrose Vaccine 03/11/2021   PFIZER(Purple Top)SARS-COV-2 Vaccination 11/20/2019, 12/11/2019, 06/22/2020   Pneumococcal Conjugate-13 02/07/2018   Pneumococcal Polysaccharide-23 08/01/2011, 04/21/2019   Tdap 08/01/2011   Zoster, Live 08/03/2010    TDAP status: Due, Education has been provided regarding the importance of this vaccine. Advised may receive this vaccine at local pharmacy or Health Dept. Aware to provide a copy of the vaccination record if obtained from local pharmacy or Health Dept. Verbalized acceptance and understanding.  Flu Vaccine status: Up to date  Pneumococcal vaccine status: Up to date  Covid-19 vaccine status: Completed vaccines  Qualifies for Shingles Vaccine? Yes   Zostavax completed Yes   Shingrix Completed?: No.    Education has been provided regarding the importance of this vaccine. Patient has been advised to call insurance company to determine out of pocket expense if they have not yet received this vaccine. Advised may also receive vaccine at local pharmacy or Health Dept. Verbalized acceptance and understanding.  Screening Tests Health Maintenance  Topic Date Due   Hepatitis C Screening  Never done   Zoster Vaccines- Shingrix (1 of 2) Never done   COVID-19 Vaccine (5 - Booster  for Pfizer series) 05/06/2021   INFLUENZA VACCINE  05/30/2021   TETANUS/TDAP  07/31/2021   Pneumonia Vaccine 74+ Years old  Completed  DEXA SCAN  Completed   HPV VACCINES  Aged Out    Health Maintenance  Health Maintenance Due  Topic Date Due   Hepatitis C Screening  Never done   Zoster Vaccines- Shingrix (1 of 2) Never done   COVID-19 Vaccine (5 - Booster for Pfizer series) 05/06/2021   INFLUENZA VACCINE  05/30/2021   TETANUS/TDAP  07/31/2021    Colorectal cancer screening: No longer required.   Mammogram status: No longer required due to age.  Bone Density status: Completed 06/28/21. Results reflect: Bone density results: OSTEOPOROSIS. Repeat every 2 years.  Lung Cancer Screening: (Low Dose CT Chest recommended if Age 56-80 years, 30 pack-year currently smoking OR have quit w/in 15years.) does not qualify.    Additional Screening:  Hepatitis C Screening: does not qualify; Completed no  Vision Screening: Recommended annual ophthalmology exams for early detection of glaucoma and other disorders of the eye. Is the patient up to date with their annual eye exam?  Yes  Who is the provider or what is the name of the office in which the patient attends annual eye exams? [facility] If pt is not established with a provider, would they like to be referred to a provider to establish care? No .   Dental Screening: Recommended annual dental exams for proper oral hygiene  Community Resource Referral / Chronic Care Management: CRR required this visit?  No   CCM required this visit?  No      Plan:     I have personally reviewed and noted the following in the patients chart:   Medical and social history Use of alcohol, tobacco or illicit drugs  Current medications and supplements including opioid prescriptions.  Functional ability and status Nutritional status Physical activity Advanced directives List of other physicians Hospitalizations, surgeries, and ER visits  in previous 12 months Vitals Screenings to include cognitive, depression, and falls Referrals and appointments  In addition, I have reviewed and discussed with patient certain preventive protocols, quality metrics, and best practice recommendations. A written personalized care plan for preventive services as well as general preventive health recommendations were provided to patient.     Heidi David, LPN   [id number]   Nurse Notes: none- due for mammogram in late August

## 2021-12-12 ENCOUNTER — Other Ambulatory Visit: Payer: Self-pay | Admitting: Family Medicine

## 2021-12-12 DIAGNOSIS — M47816 Spondylosis without myelopathy or radiculopathy, lumbar region: Secondary | ICD-10-CM

## 2021-12-12 DIAGNOSIS — F411 Generalized anxiety disorder: Secondary | ICD-10-CM

## 2021-12-12 DIAGNOSIS — G8929 Other chronic pain: Secondary | ICD-10-CM

## 2021-12-12 DIAGNOSIS — F330 Major depressive disorder, recurrent, mild: Secondary | ICD-10-CM

## 2021-12-13 NOTE — Telephone Encounter (Signed)
Requested medication (s) are due for refill today: yes  Requested medication (s) are on the active medication list: yes  Last refill:  Zoloft 06/16/21 #90 with 1 RF, Zanaflex 06/16/21 #360 with 1 RF  Future visit scheduled: 01/09/22  Notes to clinic:  Neither med is delegated, please assess.  Requested Prescriptions  Pending Prescriptions Disp Refills   tizanidine (ZANAFLEX) 2 MG capsule [Pharmacy Med Name: tiZANidine HCL 2 MG CAPSULE] 360 capsule 1    Sig: TAKE 1 TO 2 CAPSULES BY MOUTH 2 TIMES A DAY AS NEEDED FOR MUSCLE SPASMS.     Not Delegated - Cardiovascular:  Alpha-2 Agonists - tizanidine Failed - 12/12/2021  6:21 AM      Failed - This refill cannot be delegated      Passed - Valid encounter within last 6 months    Recent Outpatient Visits           5 months ago Essential hypertension   [place], DO   8 months ago Recurrent major depressive episodes, mild [facility])   [place], DO   11 months ago Primary insomnia   [place], DO   1 year ago Drug-induced peripheral neuropathy [facility])   [place], DO       Future Appointments             In 3 weeks [place], Devonne Doughty, DO [facility]             sertraline (ZOLOFT) 100 MG tablet [Pharmacy Med Name: SERTRALINE HCL 100 MG TABLET] 90 tablet 1    Sig: TAKE 1 TABLET BY MOUTH DAILY.     Not Delegated - Psychiatry:  Antidepressants - SSRI - sertraline Failed - 12/12/2021  6:21 AM      Failed - This refill cannot be delegated      Passed - AST in normal range and within 360 days    AST  Date Value Ref Range Status  01/31/2021 25 15 - 41 U/L Final   SGOT(AST)  Date Value Ref Range Status  02/19/2015 21 U/L Final    Comment:    15-41 NOTE: New Reference Range  01/05/15           Passed - ALT in normal range and within  360 days    ALT  Date Value Ref Range Status  01/31/2021 25 0 - 44 U/L Final   SGPT (ALT)  Date Value Ref Range Status  02/19/2015 19 U/L Final    Comment:    14-54 NOTE: New Reference Range  01/05/15           Passed - Completed PHQ-2 or PHQ-9 in the last 360 days      Passed - Valid encounter within last 6 months    Recent Outpatient Visits           5 months ago Essential hypertension   [place], DO   8 months ago Recurrent major depressive episodes, mild [facility])   [place], DO   11 months ago Primary insomnia   [place], DO   1 year ago Drug-induced peripheral neuropathy [facility])   [place], Devonne Doughty, DO       Future Appointments  In 3 weeks Parks Ranger, Devonne Doughty, DO [facility], [facility]

## 2021-12-21 ENCOUNTER — Other Ambulatory Visit: Payer: Self-pay | Admitting: Oncology

## 2022-01-09 ENCOUNTER — Encounter: Payer: Self-pay | Admitting: Family Medicine

## 2022-01-09 ENCOUNTER — Other Ambulatory Visit: Payer: Self-pay

## 2022-01-09 ENCOUNTER — Ambulatory Visit (INDEPENDENT_AMBULATORY_CARE_PROVIDER_SITE_OTHER): Payer: Medicare Other | Admitting: Family Medicine

## 2022-01-09 VITALS — BP 108/41 | HR 101 | Ht 64.0 in | Wt 186.4 lb

## 2022-01-09 DIAGNOSIS — M545 Low back pain, unspecified: Secondary | ICD-10-CM | POA: Diagnosis not present

## 2022-01-09 DIAGNOSIS — F330 Major depressive disorder, recurrent, mild: Secondary | ICD-10-CM

## 2022-01-09 DIAGNOSIS — G8929 Other chronic pain: Secondary | ICD-10-CM

## 2022-01-09 DIAGNOSIS — F411 Generalized anxiety disorder: Secondary | ICD-10-CM | POA: Diagnosis not present

## 2022-01-09 MED ORDER — SERTRALINE HCL 100 MG PO TABS: 150.0000 mg | ORAL_TABLET | Freq: Every day | ORAL | 1 refills | Status: DC

## 2022-01-09 NOTE — Patient Instructions (Addendum)
Thank you for coming to the office today. ? ?Shingrix Vaccines shingles shot at pharmacy 2 doses 2-6 months apart. ? ?Dose increase Buspar and Sertraline each of these is now 1.5 pills. Let me know when ready for Buspar refill. ? ?R Ear is clear, likely some referred pain from jaw/tooth. Can also have sinus pressure. ? ?Use the nasal spray. ? ?Please schedule a Follow-up Appointment to: Return in about 6 months (around 07/12/2022) for 6 month Annual Physical (Labs if need). ? ?If you have any other questions or concerns, please feel free to call the office or send a message through [facility]. You may also schedule an earlier appointment if necessary. ? ?Additionally, you may be receiving a survey about your experience at our office within a few days to 1 week by e-mail or mail. We value your feedback. ? ?Nobie Putnam, DO ?[place] ?

## 2022-01-09 NOTE — Progress Notes (Unsigned)
Subjective:    Patient ID: Heidi [name], female    DOB: [birth year], 81 y.o.   MRN: [id number]  Heidi [name] is a 81 y.o. female presenting on 01/09/2022 for Hypertension and Depression   HPI  Dose increase Buspar from '30mg'$  daily to 1.5 tab = '45mg'$  daily Dose increase Sertraline '100mg'$  daily up to 1.5 = '150mg'$  daily. New rx sent.  Dentist identified infected abscess. Treated with Amoxicillin by dentist.  Right Ear Pain ***R ear looks clear today Maybe mild fluid Jaw pain can radiate  Chronic Low Back Pain Knee Osteoarthritis Failed Meloxicam, Gabapentin. Asks about other treatment options     Recurrent Depression, mild chronic Generalized Anxiety Disorder Chronic problems, seem episodic, can be related to health and stress as well   On Sertraline '100mg'$  daily with improvement and Buspar '30mg'$  daily   Previously on Sertraline, Prozac, Cymbalta. Since age 50s. No longer hand shaking from Cymbalta.     - Taking Tizanidine for sleep with improvement, taking '2mg'$  x 2 dose in afternoon or PM to help.   Elevated BP Admits sedentary not as active.   Updated on hair loss - has improved.    Health Maintenance: ***  Depression screen [facility] 2/9 01/09/2022 12/09/2021 07/06/2021  Decreased Interest 1 0 0  Down, Depressed, Hopeless 1 0 0  PHQ - 2 Score 2 0 0  Altered sleeping 0 - 0  Tired, decreased energy 0 - 0  Change in appetite 0 - 0  Feeling bad or failure about yourself  0 - 0  Trouble concentrating 0 - 0  Moving slowly or fidgety/restless 0 - 0  Suicidal thoughts 0 - 0  PHQ-9 Score 2 - 0  Difficult doing work/chores Not difficult at all - Not difficult at all   GAD 7 : Generalized Anxiety Score 01/09/2022 07/06/2021 03/30/2021 09/21/2020  Nervous, Anxious, on Edge '1 1 1 2  '$ Control/stop worrying 0 0 1 0  Worry too much - different things 0 0 1 1  Trouble relaxing 0 0 0 1  Restless 0 0 0 0  Easily annoyed or irritable 0 0 0 0  Afraid - awful might happen 0 0 0 0  Total GAD 7  Score '1 1 3 4  '$ Anxiety Difficulty Not difficult at all Not difficult at all Somewhat difficult Somewhat difficult     Social History   Tobacco Use   Smoking status: Former    Packs/day: 2.00    Years: 25.00    Pack years: 50.00    Types: Cigarettes    Quit date: 04/28/1992    Years since quitting: 29.7   Smokeless tobacco: Former   Tobacco comments:    Pt quit 1995  Vaping Use   Vaping Use: Never used  Substance Use Topics   Alcohol use: No    Alcohol/week: 0.0 standard drinks   Drug use: No    Review of Systems Per HPI unless specifically indicated above     Objective:    BP (!) 108/41    Pulse (!) 101    Ht '5\' 4"'$  (1.626 m)    Wt 186 lb 6.4 oz (84.6 kg)    SpO2 99%    BMI 32.00 kg/m   Wt Readings from Last 3 Encounters:  01/09/22 186 lb 6.4 oz (84.6 kg)  07/06/21 191 lb 3.2 oz (86.7 kg)  06/30/21 188 lb (85.3 kg)    Physical Exam Vitals and nursing note reviewed.  Constitutional:  General: She is not in acute distress.    Appearance: She is well-developed. She is not diaphoretic.     Comments: Well-appearing, comfortable, cooperative  HENT:     Head: Normocephalic and atraumatic.  Eyes:     General:        Right eye: No discharge.        Left eye: No discharge.     Conjunctiva/sclera: Conjunctivae normal.  Neck:     Thyroid: No thyromegaly.  Cardiovascular:     Rate and Rhythm: Normal rate and regular rhythm.     Heart sounds: Normal heart sounds. No murmur heard. Pulmonary:     Effort: Pulmonary effort is normal. No respiratory distress.     Breath sounds: Normal breath sounds. No wheezing or rales.  Musculoskeletal:        General: Normal range of motion.     Cervical back: Normal range of motion and neck supple.  Lymphadenopathy:     Cervical: No cervical adenopathy.  Skin:    General: Skin is warm and dry.     Findings: No erythema or rash.  Neurological:     Mental Status: She is alert and oriented to person, place, and time.   Psychiatric:        Behavior: Behavior normal.     Comments: Well groomed, good eye contact, normal speech and thoughts     Results for orders placed or performed in visit on 01/31/21  Comprehensive metabolic panel  Result Value Ref Range   Sodium 142 135 - 145 mmol/L   Potassium 4.0 3.5 - 5.1 mmol/L   Chloride 108 98 - 111 mmol/L   CO2 25 22 - 32 mmol/L   Glucose, Bld 107 (H) 70 - 99 mg/dL   BUN 18 8 - 23 mg/dL   Creatinine, Ser 0.79 0.44 - 1.00 mg/dL   Calcium 8.8 (L) 8.9 - 10.3 mg/dL   Total Protein 6.7 6.5 - 8.1 g/dL   Albumin 3.9 3.5 - 5.0 g/dL   AST 25 15 - 41 U/L   ALT 25 0 - 44 U/L   Alkaline Phosphatase 55 38 - 126 U/L   Total Bilirubin 0.7 0.3 - 1.2 mg/dL   GFR, Estimated >60 >60 mL/min   Anion gap 9 5 - 15  CBC with Differential  Result Value Ref Range   WBC 5.7 4.0 - 10.5 K/uL   RBC 3.89 3.87 - 5.11 MIL/uL   Hemoglobin 12.8 12.0 - 15.0 g/dL   HCT 38.9 36.0 - 46.0 %   MCV 100.0 80.0 - 100.0 fL   MCH 32.9 26.0 - 34.0 pg   MCHC 32.9 30.0 - 36.0 g/dL   RDW 12.1 11.5 - 15.5 %   Platelets 196 150 - 400 K/uL   nRBC 0.0 0.0 - 0.2 %   Neutrophils Relative % 63 %   Neutro Abs 3.6 1.7 - 7.7 K/uL   Lymphocytes Relative 25 %   Lymphs Abs 1.5 0.7 - 4.0 K/uL   Monocytes Relative 9 %   Monocytes Absolute 0.5 0.1 - 1.0 K/uL   Eosinophils Relative 2 %   Eosinophils Absolute 0.1 0.0 - 0.5 K/uL   Basophils Relative 1 %   Basophils Absolute 0.0 0.0 - 0.1 K/uL   Immature Granulocytes 0 %   Abs Immature Granulocytes 0.02 0.00 - 0.07 K/uL      Assessment & Plan:   Problem List Items Addressed This Visit     Recurrent major depressive episodes, mild (HCC)  Relevant Medications   busPIRone (BUSPAR) 30 MG tablet   sertraline (ZOLOFT) 100 MG tablet   GAD (generalized anxiety disorder) - Primary   Relevant Medications   busPIRone (BUSPAR) 30 MG tablet   sertraline (ZOLOFT) 100 MG tablet   Back pain    Handicap placard signed today.  Meds ordered this encounter   Medications   sertraline (ZOLOFT) 100 MG tablet    Sig: Take 1.5 tablets (150 mg total) by mouth daily.    Dispense:  135 tablet    Refill:  1    Follow up plan: Return in about 6 months (around 07/12/2022) for 6 month Annual Physical (Labs if need).  Future labs ordered for *** Future labs including lipid later visit.  Nobie Putnam, [facility] 01/09/2022, 3:59 PM

## 2022-01-10 NOTE — Assessment & Plan Note (Signed)
Improved on higher dose ?Dose adjust Buspar '30mg'$  + half tab = 83mdaily, max dose, no further increase ?Will re order when request ?

## 2022-01-10 NOTE — Assessment & Plan Note (Signed)
Mild symptoms ?Recurrent history ?On SSRI ?Will dose increase from 100 to '150mg'$  - take 1.5 tab daily, new rx sent ?

## 2022-01-11 ENCOUNTER — Encounter: Payer: Self-pay | Admitting: Oncology

## 2022-01-12 ENCOUNTER — Telehealth: Payer: Self-pay

## 2022-01-12 NOTE — Telephone Encounter (Signed)
Contacted pt to let her know that she can reach out to her dentist office and provide them with our fax number in order to fill out paperwork for her tooth to be pulled. Pt was confused on her future appointments and I explained that based off office notes she last saw Sonia Baller 06/2021, pt states she did not see Anderson Malta she saw Dr. Janese Banks and Dr. Janese Banks explained she would see her every 6 months for either labwork or office visit. I explained to pt that Dr. Janese Banks is currently on vacation and return to clinic on Monday,  I will figure out the next steps per Dr. Janese Banks suggestion and reach back out to make her aware. Pt understands and agrees.  ? ?

## 2022-01-16 ENCOUNTER — Telehealth: Payer: Self-pay | Admitting: *Deleted

## 2022-01-16 NOTE — Telephone Encounter (Signed)
Called the patient and let her know that she can have the tooth extraction and come off fosamax for 1 week and then start back on it fosamax 2 weeks after the extraction was done. I asked her if I need to write a letter to dentist and she states no. All she has to do is tell the dentist. ? ?The patient says that she saw Dr. Janese Banks in Sept 2022 and not the [facility]. I told her that I will ask her and let her know ?

## 2022-03-13 ENCOUNTER — Other Ambulatory Visit: Payer: Self-pay | Admitting: Family Medicine

## 2022-03-13 DIAGNOSIS — F411 Generalized anxiety disorder: Secondary | ICD-10-CM

## 2022-03-13 NOTE — Telephone Encounter (Signed)
Medication Refill - Medication: busPIRone (BUSPAR) 30 MG tablet ? ?Pt stated she is currently taking 1 and a half tablet and its only lasting 60 days.  ? ?Has the patient contacted their pharmacy? Yes.   Pharmacy advised her its too soon to refill.  ? ?(Agent: If yes, when and what did the pharmacy advise?) ? ?Preferred Pharmacy (with phone number or street name):  ?[facility] [id number] [place]  ?[place] [address]  ?Phone: [phone] Fax: [phone]  ?Hours: Not open 24 hours  ? ?Has the patient been seen for an appointment in the last year OR does the patient have an upcoming appointment? Yes.   ? ?Agent: Please be advised that RX refills may take up to 3 business days. We ask that you follow-up with your pharmacy.  ?

## 2022-03-15 MED ORDER — BUSPIRONE HCL 30 MG PO TABS: 45.0000 mg | ORAL_TABLET | Freq: Every day | ORAL | 3 refills | Status: DC

## 2022-03-15 NOTE — Telephone Encounter (Signed)
Requested medication (s) are due for refill today:   Pharmacy is telling pt she is requesting refills too soon.  She is taking 1 and 1/2 tablets per order.   More refills needed per pharmacy. ? ?Requested medication (s) are on the active medication list:   Yes ? ?Future visit scheduled:   Yes ? ? ?Last ordered: 01/09/2022 #135, 3 refills however on med list it's listed as historical. ? ?Returned because pharmacy is requesting more refills and med is listed as historical.    ? ?Requested Prescriptions  ?Pending Prescriptions Disp Refills  ? busPIRone (BUSPAR) 30 MG tablet 135 tablet 3  ?  Sig: Take 1.5 tablets (45 mg total) by mouth daily.  ?  ? Psychiatry: Anxiolytics/Hypnotics - Non-controlled Passed - 03/14/2022 11:21 AM  ?  ?  Passed - Valid encounter within last 12 months  ?  Recent Outpatient Visits   ? ?      ? 2 months ago GAD (generalized anxiety disorder)  ? [place], DO  ? 8 months ago Essential hypertension  ? [place], DO  ? 11 months ago Recurrent major depressive episodes, mild ([place])  ? [place], DO  ? 1 year ago Primary insomnia  ? [place], DO  ? 1 year ago Drug-induced peripheral neuropathy ([place])  ? [place], DO  ? ?  ?  ?Future Appointments   ? ?        ? In 4 months Parks Ranger, Devonne Doughty, DO [facility], [place]  ? ?  ? ? ?  ?  ?  ? ?

## 2022-03-20 ENCOUNTER — Other Ambulatory Visit: Payer: Self-pay | Admitting: Family Medicine

## 2022-03-20 DIAGNOSIS — G8929 Other chronic pain: Secondary | ICD-10-CM

## 2022-03-20 DIAGNOSIS — M47816 Spondylosis without myelopathy or radiculopathy, lumbar region: Secondary | ICD-10-CM

## 2022-03-21 NOTE — Telephone Encounter (Signed)
Requested medications are due for refill today.  yes  Requested medications are on the active medications list.  yes  Last refill. 07/06/2021 100g 3 refills  Future visit scheduled.   yes  Notes to clinic.  Medication refill failed protocol due to expired labs.    Requested Prescriptions  Pending Prescriptions Disp Refills   diclofenac Sodium (VOLTAREN) 1 % GEL [Pharmacy Med Name: DICLOFENAC SODIUM 1% GEL] 100 g 3    Sig: APPLY TWO GRAMS TOPICALLY THREE TIMES A DAY AS NEEDED FOR ARTHRITIS PAIN     Analgesics:  Topicals Failed - 03/20/2022  6:21 AM      Failed - Manual Review: Labs are only required if the patient has taken medication for more than 8 weeks.      Failed - PLT in normal range and within 360 days    Platelets  Date Value Ref Range Status  01/31/2021 196 150 - 400 K/uL Final   Platelet  Date Value Ref Range Status  02/22/2015 309 150 - 440 x10 3/mm  Final         Failed - HGB in normal range and within 360 days    Hemoglobin  Date Value Ref Range Status  01/31/2021 12.8 12.0 - 15.0 g/dL Final   HGB  Date Value Ref Range Status  02/22/2015 10.0 (L) 12.0 - 16.0 g/dL Final         Failed - HCT in normal range and within 360 days    HCT  Date Value Ref Range Status  01/31/2021 38.9 36.0 - 46.0 % Final  02/22/2015 30.0 (L) 35.0 - 47.0 % Final         Failed - Cr in normal range and within 360 days    Creatinine  Date Value Ref Range Status  02/19/2015 0.46 mg/dL Final    Comment:    0.44-1.00 NOTE: New Reference Range  01/05/15    Creatinine, Ser  Date Value Ref Range Status  01/31/2021 0.79 0.44 - 1.00 mg/dL Final   Creatine, Serum  Date Value Ref Range Status  02/24/2015 0.90  Final         Failed - eGFR is 30 or above and within 360 days    EGFR (African American)  Date Value Ref Range Status  02/19/2015 >60  Final   GFR calc Af Amer  Date Value Ref Range Status  07/14/2019 >60 >60 mL/min Final   EGFR (Non-African Amer.)  Date Value  Ref Range Status  02/19/2015 >60  Final    Comment:    eGFR values <55m/min/1.73 m2 may be an indication of chronic kidney disease (CKD). Calculated eGFR is useful in patients with stable renal function. The eGFR calculation will not be reliable in acutely ill patients when serum creatinine is changing rapidly. It is not useful in patients on dialysis. The eGFR calculation may not be applicable to patients at the low and high extremes of body sizes, pregnant women, and vegetarians.    GFR, Estimated  Date Value Ref Range Status  01/31/2021 >60 >60 mL/min Final    Comment:    (NOTE) Calculated using the CKD-EPI Creatinine Equation (2021)          Passed - Patient is not pregnant      Passed - Valid encounter within last 12 months    Recent Outpatient Visits           2 months ago G[facility] (generalized anxiety disorder)   [facility]   8 months ago Essential hypertension   [place], DO   11 months ago Recurrent major depressive episodes, mild [facility])   [place], DO   1 year ago Primary insomnia   [place], DO   1 year ago Drug-induced peripheral neuropathy [facility])   [facility] Parks Ranger, Devonne Doughty, DO       Future Appointments             In 4 months Parks Ranger, Devonne Doughty, DO [facility], [facility]

## 2022-05-04 ENCOUNTER — Other Ambulatory Visit: Payer: Self-pay | Admitting: Oncology

## 2022-06-07 ENCOUNTER — Other Ambulatory Visit: Payer: Self-pay | Admitting: Oncology

## 2022-06-07 DIAGNOSIS — Z1231 Encounter for screening mammogram for malignant neoplasm of breast: Secondary | ICD-10-CM

## 2022-06-08 ENCOUNTER — Other Ambulatory Visit: Payer: Self-pay | Admitting: Family Medicine

## 2022-06-08 DIAGNOSIS — F411 Generalized anxiety disorder: Secondary | ICD-10-CM

## 2022-06-08 DIAGNOSIS — F330 Major depressive disorder, recurrent, mild: Secondary | ICD-10-CM

## 2022-06-08 MED ORDER — SERTRALINE HCL 100 MG PO TABS: 150.0000 mg | ORAL_TABLET | Freq: Every day | ORAL | 3 refills | Status: DC

## 2022-06-08 NOTE — Telephone Encounter (Signed)
Requested medication (s) are due for refill today: yes  Requested medication (s) are on the active medication list: yes  Last refill:  01/09/22 #135 1 refills  Future visit scheduled: yes in 1 month  Notes to clinic:  do you want to continue refills? Take 1.5 tablets (150 mg total) by mouth daily     Requested Prescriptions  Pending Prescriptions Disp Refills   sertraline (ZOLOFT) 100 MG tablet [Pharmacy Med Name: SERTRALINE HCL 100 MG TABLET] 90 tablet     Sig: TAKE ONE TABLET BY MOUTH DAILY     Psychiatry:  Antidepressants - SSRI - sertraline Failed - 06/08/2022  6:22 AM      Failed - AST in normal range and within 360 days    AST  Date Value Ref Range Status  01/31/2021 25 15 - 41 U/L Final   SGOT(AST)  Date Value Ref Range Status  02/19/2015 21 U/L Final    Comment:    15-41 NOTE: New Reference Range  01/05/15          Failed - ALT in normal range and within 360 days    ALT  Date Value Ref Range Status  01/31/2021 25 0 - 44 U/L Final   SGPT (ALT)  Date Value Ref Range Status  02/19/2015 19 U/L Final    Comment:    14-54 NOTE: New Reference Range  01/05/15          Passed - Completed PHQ-2 or PHQ-9 in the last 360 days      Passed - Valid encounter within last 6 months    Recent Outpatient Visits           5 months ago GAD (generalized anxiety disorder)   [place], DO   11 months ago Essential hypertension   [place], DO   1 year ago Recurrent major depressive episodes, mild [facility])   [place], DO   1 year ago Primary insomnia   [place], DO   1 year ago Drug-induced peripheral neuropathy [facility])   [facility] Parks Ranger, Devonne Doughty, DO       Future Appointments             In 1 month Parks Ranger, Devonne Doughty, [facility], [facility]

## 2022-06-16 ENCOUNTER — Other Ambulatory Visit: Payer: Self-pay | Admitting: Family Medicine

## 2022-06-16 DIAGNOSIS — M47816 Spondylosis without myelopathy or radiculopathy, lumbar region: Secondary | ICD-10-CM

## 2022-06-16 DIAGNOSIS — M545 Low back pain, unspecified: Secondary | ICD-10-CM

## 2022-06-16 NOTE — Telephone Encounter (Signed)
Requested medication (s) are due for refill today: yes  Requested medication (s) are on the active medication list: yes  Last refill:  12/13/21 #360 1 refills  Future visit scheduled: yes in 1 month  Notes to clinic:  not delegated per protocol. Do you want to refill Rx?     Requested Prescriptions  Pending Prescriptions Disp Refills   tizanidine (ZANAFLEX) 2 MG capsule [Pharmacy Med Name: tiZANidine HCL 2 MG CAPSULE] 360 capsule 1    Sig: TAKE 1 TO 2 CAPSULES BY MOUTH 2 TIMES A DAY AS NEEDED FOR MUSCLE SPASMS.     Not Delegated - Cardiovascular:  Alpha-2 Agonists - tizanidine Failed - 06/16/2022  9:24 AM      Failed - This refill cannot be delegated      Passed - Valid encounter within last 6 months    Recent Outpatient Visits           5 months ago GAD (generalized anxiety disorder)   [place], DO   11 months ago Essential hypertension   [place], DO   1 year ago Recurrent major depressive episodes, mild [facility])   [place], DO   1 year ago Primary insomnia   [place], DO   1 year ago Drug-induced peripheral neuropathy [facility])   [facility] Olin Hauser, DO       Future Appointments             In 1 month Parks Ranger, Devonne Doughty, [facility], [facility]

## 2022-06-29 ENCOUNTER — Other Ambulatory Visit: Payer: Self-pay

## 2022-06-29 DIAGNOSIS — Z08 Encounter for follow-up examination after completed treatment for malignant neoplasm: Secondary | ICD-10-CM

## 2022-06-30 ENCOUNTER — Encounter: Payer: Self-pay | Admitting: Oncology

## 2022-06-30 ENCOUNTER — Inpatient Hospital Stay: Payer: Medicare Other | Attending: Oncology

## 2022-06-30 ENCOUNTER — Inpatient Hospital Stay (HOSPITAL_BASED_OUTPATIENT_CLINIC_OR_DEPARTMENT_OTHER): Payer: Medicare Other | Admitting: Oncology

## 2022-06-30 VITALS — BP 166/95 | HR 70 | Temp 98.3°F | Resp 16 | Ht 64.0 in | Wt 188.2 lb

## 2022-06-30 DIAGNOSIS — M810 Age-related osteoporosis without current pathological fracture: Secondary | ICD-10-CM | POA: Diagnosis not present

## 2022-06-30 DIAGNOSIS — Z87891 Personal history of nicotine dependence: Secondary | ICD-10-CM | POA: Insufficient documentation

## 2022-06-30 DIAGNOSIS — Z9011 Acquired absence of right breast and nipple: Secondary | ICD-10-CM | POA: Diagnosis not present

## 2022-06-30 DIAGNOSIS — I10 Essential (primary) hypertension: Secondary | ICD-10-CM | POA: Insufficient documentation

## 2022-06-30 DIAGNOSIS — Z7901 Long term (current) use of anticoagulants: Secondary | ICD-10-CM | POA: Diagnosis not present

## 2022-06-30 DIAGNOSIS — Z923 Personal history of irradiation: Secondary | ICD-10-CM | POA: Insufficient documentation

## 2022-06-30 DIAGNOSIS — Z8052 Family history of malignant neoplasm of bladder: Secondary | ICD-10-CM | POA: Insufficient documentation

## 2022-06-30 DIAGNOSIS — Z86711 Personal history of pulmonary embolism: Secondary | ICD-10-CM | POA: Diagnosis not present

## 2022-06-30 DIAGNOSIS — Z08 Encounter for follow-up examination after completed treatment for malignant neoplasm: Secondary | ICD-10-CM

## 2022-06-30 DIAGNOSIS — Z853 Personal history of malignant neoplasm of breast: Secondary | ICD-10-CM | POA: Insufficient documentation

## 2022-06-30 LAB — CBC WITH DIFFERENTIAL/PLATELET
Abs Immature Granulocytes: 0.01 10*3/uL (ref 0.00–0.07)
Basophils Absolute: 0.1 10*3/uL (ref 0.0–0.1)
Basophils Relative: 1 %
Eosinophils Absolute: 0.2 10*3/uL (ref 0.0–0.5)
Eosinophils Relative: 3 %
HCT: 38.1 % (ref 36.0–46.0)
Hemoglobin: 12.8 g/dL (ref 12.0–15.0)
Immature Granulocytes: 0 %
Lymphocytes Relative: 29 %
Lymphs Abs: 1.7 10*3/uL (ref 0.7–4.0)
MCH: 34 pg (ref 26.0–34.0)
MCHC: 33.6 g/dL (ref 30.0–36.0)
MCV: 101.1 fL — ABNORMAL HIGH (ref 80.0–100.0)
Monocytes Absolute: 0.5 10*3/uL (ref 0.1–1.0)
Monocytes Relative: 8 %
Neutro Abs: 3.6 10*3/uL (ref 1.7–7.7)
Neutrophils Relative %: 59 %
Platelets: 241 10*3/uL (ref 150–400)
RBC: 3.77 MIL/uL — ABNORMAL LOW (ref 3.87–5.11)
RDW: 11.9 % (ref 11.5–15.5)
WBC: 6.1 10*3/uL (ref 4.0–10.5)
nRBC: 0 % (ref 0.0–0.2)

## 2022-06-30 LAB — COMPREHENSIVE METABOLIC PANEL
ALT: 24 U/L (ref 0–44)
AST: 26 U/L (ref 15–41)
Albumin: 4.1 g/dL (ref 3.5–5.0)
Alkaline Phosphatase: 65 U/L (ref 38–126)
Anion gap: 9 (ref 5–15)
BUN: 17 mg/dL (ref 8–23)
CO2: 26 mmol/L (ref 22–32)
Calcium: 9.8 mg/dL (ref 8.9–10.3)
Chloride: 103 mmol/L (ref 98–111)
Creatinine, Ser: 0.7 mg/dL (ref 0.44–1.00)
GFR, Estimated: >60 mL/min (ref >60)
Glucose, Bld: 98 mg/dL (ref 70–99)
Potassium: 4.5 mmol/L (ref 3.5–5.1)
Sodium: 138 mmol/L (ref 135–145)
Total Bilirubin: 0.5 mg/dL (ref 0.3–1.2)
Total Protein: 7.2 g/dL (ref 6.5–8.1)

## 2022-06-30 NOTE — Progress Notes (Signed)
Hematology/Oncology Consult note [facility]  [phone] [phone]  Patient Care Team: Olin Hauser, DO as PCP - General (Family Medicine)   Name of the patient: Heidi [name]  [id number]  1941/06/22   Date of visit: 06/30/22  Diagnosis- stage II left breast cancer ER/PR positive and HER-2/neu negative  Chief complaint/ Reason for visit-routine follow-up of breast cancer  Heme/Onc history:  Oncology History Overview Note  # March 2016- LEFT BREAST CANCER STAGE II ER- 90%; PR-90%; Her 2 neu- NEG; NEO-ADJ CHEMO-  AC x4- Taxol x12 w;;s/p LUMPEC & SLNBx [ypT1 [1.5cm]; ypsN 3/6 (2 macro & 1 micro met)]  s/p RT [Nov 1st week;finish] NOV 2016- START FEMARA stop Feb 2017; April 2017- start Arimidex  # Right Breast cancer ? Stage;  s/p Mastec [1990;s/p Chemo; RT ]  # PN G-2 on neurontin; Hx PE on eliquis  # Mediport   Cancer of overlapping sites of left female breast ([place]) (Resolved)  06/09/2016 Initial Diagnosis   Cancer of overlapping sites of left female breast [facility])    Patient completed 5 years of Arimidex in November 2021.  She is on weekly Fosamax for osteoporosis  Interval history-patient has lost significant Weight due to diet and exercise intentionally and is presently down to 188 pounds.  Her goal is to reach 160 pounds if possible.  ECOG PS- 1-2 Pain scale- 2   Review of systems- Review of Systems  Constitutional:  Positive for malaise/fatigue. Negative for chills, fever and weight loss.  HENT:  Negative for congestion, ear discharge and nosebleeds.   Eyes:  Negative for blurred vision.  Respiratory:  Negative for cough, hemoptysis, sputum production, shortness of breath and wheezing.   Cardiovascular:  Negative for chest pain, palpitations, orthopnea and claudication.  Gastrointestinal:  Negative for abdominal pain, blood in stool, constipation, diarrhea, heartburn, melena, nausea and vomiting.  Genitourinary:   Negative for dysuria, flank pain, frequency, hematuria and urgency.  Musculoskeletal:  Negative for back pain, joint pain and myalgias.  Skin:  Negative for rash.  Neurological:  Negative for dizziness, tingling, focal weakness, seizures, weakness and headaches.  Endo/Heme/Allergies:  Does not bruise/bleed easily.  Psychiatric/Behavioral:  Negative for depression and suicidal ideas. The patient does not have insomnia.       Allergies  Allergen Reactions   Other Anaphylaxis    Uncoded Allergy. Allergen: HYDROCHLORLIC ACID- causes water blisters   Diphenhydramine Other (See Comments)    pt states makes legs very restless and ache   Hydrochloric Acid Hives    Other reaction(s): Blisters Uncoded Allergy. Allergen: HYDROCHLORLIC ACID.     Past Medical History:  Diagnosis Date   Anemia    Anxiety    Breast cancer ([place]) 1990   RT MASTECTOMY   C. difficile diarrhea    see dr Vira Agar and this is her second course of vancomycin   Cataracts, bilateral    Depression    Dyspnea    with exersion   Hepatitis 1960's   does not know which type   Hyperlipidemia    Hypertension    Insomnia    Neuropathy due to chemotherapeutic drug ([place])    Osteoarthritis    Osteoporosis    Personal history of radiation therapy 2016   LEFT lumpectomy     Past Surgical History:  Procedure Laterality Date   APPENDECTOMY  1970's   BREAST BIOPSY Left 05/07/2015   Procedure: BREAST BIOPSY WITH NEEDLE LOCALIZATION;  Surgeon: Dia Crawford III, MD;  Location: [facility] ORS;  Service: General;  Laterality: Left;   BREAST LUMPECTOMY Left 2016   BREAST LUMPECTOMY WITH SENTINEL LYMPH NODE BIOPSY Left 05/07/2015   Procedure: PARTIAL MASTECTOMY WITH SENTINEL LYMPH NODE BX;  Surgeon: Dia Crawford III, MD;  Location: [facility] ORS;  Service: General;  Laterality: Left;   BREAST SURGERY Right 1990   mastectomy   EYE SURGERY Bilateral    cataract extractions   MASTECTOMY Right    1990's   PORT-A-CATH REMOVAL Right 03/07/2017    Procedure: REMOVAL PORT-A-CATH;  Surgeon: Vickie Epley, MD;  Location: [facility] ORS;  Service: General;  Laterality: Right;    Social History   Socioeconomic History   Marital status: Divorced    Spouse name: Not on file   Number of children: Not on file   Years of education: Not on file   Highest education level: Not on file  Occupational History   Not on file  Tobacco Use   Smoking status: Former    Packs/day: 2.00    Years: 25.00    Total pack years: 50.00    Types: Cigarettes    Quit date: 04/28/1992    Years since quitting: 30.1   Smokeless tobacco: Former   Tobacco comments:    Pt quit 1995  Vaping Use   Vaping Use: Never used  Substance and Sexual Activity   Alcohol use: No    Alcohol/week: 0.0 standard drinks of alcohol   Drug use: No   Sexual activity: Never  Other Topics Concern   Not on file  Social History Narrative   Not on file   Social Determinants of Health   Financial Resource Strain: Low Risk  (12/09/2021)   Overall Financial Resource Strain (CARDIA)    Difficulty of Paying Living Expenses: Not very hard  Food Insecurity: No Food Insecurity (12/09/2021)   Hunger Vital Sign    Worried About Running Out of Food in the Last Year: Never true    Ran Out of Food in the Last Year: Never true  Transportation Needs: No Transportation Needs (12/09/2021)   PRAPARE - Hydrologist (Medical): No    Lack of Transportation (Non-Medical): No  Physical Activity: Insufficiently Active (12/09/2021)   Exercise Vital Sign    Days of Exercise per Week: 1 day    Minutes of Exercise per Session: 60 min  Stress: No Stress Concern Present (12/09/2021)   [place]    Feeling of Stress : Only a little  Social Connections: Socially Isolated (12/09/2021)   Social Connection and Isolation Panel [NHANES]    Frequency of Communication with Friends and Family: More than three times a week     Frequency of Social Gatherings with Friends and Family: Once a week    Attends Religious Services: Never    Marine scientist or Organizations: No    Attends Archivist Meetings: Never    Marital Status: Divorced  Human resources officer Violence: Not At Risk (12/09/2021)   Humiliation, Afraid, Rape, and Kick questionnaire    Fear of Current or Ex-Partner: No    Emotionally Abused: No    Physically Abused: No    Sexually Abused: No    Family History  Problem Relation Age of Onset   Dementia Mother    Bladder Cancer Father    Diabetes Sister    Breast cancer Neg Hx      Current Outpatient Medications:    alendronate (  FOSAMAX) 70 MG tablet, TAKE ONE TABLET BY MOUTH ONCE WEEKLY ON AN EMPTY STOMACH BEFORE BREAKFAST. REMAIN UPRIGHT FOR 30 MINUTES AND TAKE WITH 8 OUNCES OF WATER, Disp: 12 tablet, Rfl: 0   BucAlfAspKGlucCouchParsUvaUrJu (WATER PILLS PO), Take 1-2 tablets by mouth daily as needed (for fluid retention)., Disp: , Rfl:    busPIRone (BUSPAR) 30 MG tablet, Take 1.5 tablets (45 mg total) by mouth daily., Disp: 135 tablet, Rfl: 3   Calcium Carb-Cholecalciferol (CALCIUM 600 + D PO), Take 1 tablet by mouth 2 (two) times a day., Disp: , Rfl:    diclofenac Sodium (VOLTAREN) 1 % GEL, APPLY TWO GRAMS TOPICALLY THREE TIMES A DAY AS NEEDED FOR ARTHRITIS PAIN, Disp: 100 g, Rfl: 3   ipratropium (ATROVENT) 0.06 % nasal spray, Place 1 spray into both nostrils daily as needed., Disp: , Rfl:    losartan (COZAAR) 100 MG tablet, Take 1 tablet (100 mg total) by mouth daily., Disp: 90 tablet, Rfl: 3   Multiple Vitamin (MULTIVITAMIN WITH MINERALS) TABS tablet, Take 1 tablet by mouth at bedtime. Senior Multivitamin, Disp: , Rfl:    NON FORMULARY, Take 2 capsules by mouth daily. Dynamic brain, Disp: , Rfl:    Red Yeast Rice 600 MG CAPS, Take 2 capsules by mouth daily., Disp: , Rfl:    sertraline (ZOLOFT) 100 MG tablet, Take 1.5 tablets (150 mg total) by mouth daily., Disp: 135 tablet, Rfl:  3   tizanidine (ZANAFLEX) 2 MG capsule, TAKE 1 TO 2 CAPSULES BY MOUTH 2 TIMES A DAY AS NEEDED FOR MUSCLE SPASMS, Disp: 360 capsule, Rfl: 1   traZODone (DESYREL) 100 MG tablet, Take 1 tablet (100 mg total) by mouth at bedtime as needed for sleep. Dose adjust according to insomnia symptom, Disp: 90 tablet, Rfl: 1   Turmeric 400 MG CAPS, Take 1 capsule by mouth 2 (two) times daily., Disp: , Rfl:    SHINGRIX injection, Inject 0.5 mL into muscle for shingles vaccine. Repeat dose in 2-6 months. (Patient not taking: Reported on 06/30/2022), Disp: 0.5 mL, Rfl: 1  Physical exam:  Vitals:   06/30/22 1321  BP: (!) 166/95  Pulse: 70  Resp: 16  Temp: 98.3 F (36.8 C)  TempSrc: Oral  Weight: 188 lb 3.2 oz (85.4 kg)  Height: '5\' 4"'  (1.626 m)   Physical Exam Constitutional:      General: She is not in acute distress. Cardiovascular:     Rate and Rhythm: Normal rate and regular rhythm.     Heart sounds: Normal heart sounds.  Pulmonary:     Effort: Pulmonary effort is normal.     Breath sounds: Normal breath sounds.  Skin:    General: Skin is warm and dry.  Neurological:     Mental Status: She is alert and oriented to person, place, and time.   Breast exam: Patient is s/p right mastectomy with a well-healed surgical scar.  No evidence of chest wall recurrence.  No palpable bilateral axillary adenopathy.  No palpable masses in the left breast     Latest Ref Rng & Units 06/30/2022   12:40 PM  CMP  Glucose 70 - 99 mg/dL 98   BUN 8 - 23 mg/dL 17   Creatinine 0.44 - 1.00 mg/dL 0.70   Sodium 135 - 145 mmol/L 138   Potassium 3.5 - 5.1 mmol/L 4.5   Chloride 98 - 111 mmol/L 103   CO2 22 - 32 mmol/L 26   Calcium 8.9 - 10.3 mg/dL 9.8   Total Protein  6.5 - 8.1 g/dL 7.2   Total Bilirubin 0.3 - 1.2 mg/dL 0.5   Alkaline Phos 38 - 126 U/L 65   AST 15 - 41 U/L 26   ALT 0 - 44 U/L 24       Latest Ref Rng & Units 06/30/2022   12:40 PM  CBC  WBC 4.0 - 10.5 K/uL 6.1   Hemoglobin 12.0 - 15.0 g/dL 12.8    Hematocrit 36.0 - 46.0 % 38.1   Platelets 150 - 400 K/uL 241     Assessment and plan- Patient is a 81 y.o. female with history of left breast cancer stage I status post lumpectomy, adjuvant radiation therapy and 5 years of Arimidex as well as right mastectomy.  This is a routine follow-up visit  Patient is now more than 5 years since her breast cancer diagnosis.  She has completed 5 years of endocrine therapy.  She remains on weekly Fosamax for osteoporosis.  Clinically she is doing well Currently with no concerning signs and symptoms of recurrence based on today's exam.  I will see her back in 1 year no labs and I will get a bone density scan prior.  Her recent mammogram from August 2022 was normal and she is scheduled for repeat mammogram in September 2023   Visit Diagnosis 1. Osteoporosis without current pathological fracture, unspecified osteoporosis type   2. Encounter for follow-up surveillance of breast cancer      Dr. Randa Evens, MD, MPH [facility] at [facility] [phone] 06/30/2022 3:54 PM

## 2022-07-04 ENCOUNTER — Ambulatory Visit
Admission: RE | Admit: 2022-07-04 | Discharge: 2022-07-04 | Disposition: A | Discharge status: Home / Self Care | Payer: Medicare Other | Source: Ambulatory Visit | Attending: Oncology | Admitting: Oncology

## 2022-07-04 DIAGNOSIS — Z1231 Encounter for screening mammogram for malignant neoplasm of breast: Secondary | ICD-10-CM | POA: Insufficient documentation

## 2022-07-10 ENCOUNTER — Other Ambulatory Visit: Payer: Self-pay | Admitting: Family Medicine

## 2022-07-10 DIAGNOSIS — I10 Essential (primary) hypertension: Secondary | ICD-10-CM

## 2022-07-11 NOTE — Telephone Encounter (Signed)
Requested Prescriptions  Pending Prescriptions Disp Refills  . losartan (COZAAR) 100 MG tablet [Pharmacy Med Name: LOSARTAN POTASSIUM 100 MG TAB] 16 tablet 0    Sig: TAKE ONE TABLET BY MOUTH DAILY     Cardiovascular:  Angiotensin Receptor Blockers Failed - 07/10/2022  9:50 AM      Failed - Last BP in normal range    BP Readings from Last 1 Encounters:  06/30/22 (!) 166/95         Failed - Valid encounter within last 6 months    Recent Outpatient Visits          6 months ago GAD (generalized anxiety disorder)   [facility] Olin Hauser, DO   1 year ago Essential hypertension   [place], DO   1 year ago Recurrent major depressive episodes, mild ([place])   [facility] Parks Ranger, Devonne Doughty, DO   1 year ago Primary insomnia   [place], DO   1 year ago Drug-induced peripheral neuropathy [facility])   [facility], Devonne Doughty, DO      Future Appointments            In 2 weeks [place], Devonne Doughty, DO [facility], [place] in normal range and within 180 days    Creatinine  Date Value Ref Range Status  02/19/2015 0.46 mg/dL Final    Comment:    0.44-1.00 NOTE: New Reference Range  01/05/15    Creatinine, Ser  Date Value Ref Range Status  06/30/2022 0.70 0.44 - 1.00 mg/dL Final   Creatine, Serum  Date Value Ref Range Status  02/24/2015 0.90  Final         Passed - K in normal range and within 180 days    Potassium  Date Value Ref Range Status  06/30/2022 4.5 3.5 - 5.1 mmol/L Final  02/19/2015 4.0 mmol/L Final    Comment:    3.5-5.1 NOTE: New Reference Range  01/05/15          Passed - Patient is not pregnant

## 2022-07-26 ENCOUNTER — Ambulatory Visit (INDEPENDENT_AMBULATORY_CARE_PROVIDER_SITE_OTHER): Payer: Medicare Other | Admitting: Family Medicine

## 2022-07-26 ENCOUNTER — Encounter: Payer: Self-pay | Admitting: Family Medicine

## 2022-07-26 VITALS — BP 140/78 | HR 86 | Ht 64.0 in | Wt 189.5 lb

## 2022-07-26 DIAGNOSIS — E538 Deficiency of other specified B group vitamins: Secondary | ICD-10-CM | POA: Diagnosis not present

## 2022-07-26 DIAGNOSIS — Z0000 Encounter for general adult medical examination without abnormal findings: Secondary | ICD-10-CM

## 2022-07-26 DIAGNOSIS — I10 Essential (primary) hypertension: Secondary | ICD-10-CM | POA: Diagnosis not present

## 2022-07-26 DIAGNOSIS — Z853 Personal history of malignant neoplasm of breast: Secondary | ICD-10-CM | POA: Diagnosis not present

## 2022-07-26 DIAGNOSIS — E7800 Pure hypercholesterolemia, unspecified: Secondary | ICD-10-CM | POA: Diagnosis not present

## 2022-07-26 DIAGNOSIS — G620 Drug-induced polyneuropathy: Secondary | ICD-10-CM

## 2022-07-26 DIAGNOSIS — F5101 Primary insomnia: Secondary | ICD-10-CM

## 2022-07-26 DIAGNOSIS — R7303 Prediabetes: Secondary | ICD-10-CM | POA: Diagnosis not present

## 2022-07-26 MED ORDER — LOSARTAN POTASSIUM 100 MG PO TABS: 100.0000 mg | ORAL_TABLET | Freq: Every day | ORAL | 3 refills | Status: DC

## 2022-07-26 NOTE — Progress Notes (Unsigned)
Subjective:    Patient ID: Heidi [name], female    DOB: 15-Aug-1941, 81 y.o.   MRN: [id number]  Heidi [name] is a 81 y.o. female presenting on 07/26/2022 for Annual Exam   HPI  Here for Annual Physical ordered future labs.  CHRONIC HTN: Reports no new concerns. Not checking BP. Elevated reading at other doctors office. Current Meds - Losartan '100mg'$  daily   Reports good compliance, took meds today. Tolerating well, w/o complaints. Lifestyle: Denies dyspnea, HA, edema, dizziness / lightheadedness  History of Dental Work, and dental infection Also shingrix vaccin side effect Diet changed, also was on antibiotics. Now doing better   Chronic Low Back Pain Knee Osteoarthritis Failed Meloxicam, Gabapentin. - Taking Tizanidine for sleep with improvement, taking '2mg'$  x 2 dose in afternoon or PM to help.    Recurrent Depression, mild chronic Generalized Anxiety Disorder Chronic problems, seem episodic, can be related to health and stress as well   On Sertraline '100mg'$  daily with improvement and Buspar '45mg'$  daily   Previously on Sertraline, Prozac, Cymbalta. Since age 69s. No longer hand shaking from Cymbalta. - Taking Trazodone half tab for '50mg'$  PRN nightly.  Health Maintenance: Updated recently completed Flu Shot and Shingrix #1 about 1-2 weeks ago on 07/17/22. Next due for 2nd dose Shingrix in future 2-6 months  Future COVID Booster  Future RSV vaccine if interested but does not meet high risk criteria      07/26/2022    2:41 PM 01/09/2022    3:45 PM 12/09/2021    2:42 PM  Depression screen PHQ 2/9  Decreased Interest 1 1 0  Down, Depressed, Hopeless 1 1 0  PHQ - 2 Score 2 2 0  Altered sleeping 0 0   Tired, decreased energy 0 0   Change in appetite 0 0   Feeling bad or failure about yourself  0 0   Trouble concentrating 0 0   Moving slowly or fidgety/restless 0 0   Suicidal thoughts 0 0   PHQ-9 Score 2 2   Difficult doing work/chores Not difficult at all Not difficult  at all     Past Medical History:  Diagnosis Date   Anemia    Anxiety    Breast cancer ([place]) 1990   RT MASTECTOMY   C. difficile diarrhea    see dr Vira Agar and this is her second course of vancomycin   Cataracts, bilateral    Depression    Dyspnea    with exersion   Hepatitis 1960's   does not know which type   Hyperlipidemia    Hypertension    Insomnia    Neuropathy due to chemotherapeutic drug ([place])    Osteoarthritis    Osteoporosis    Personal history of radiation therapy 2016   LEFT lumpectomy   Past Surgical History:  Procedure Laterality Date   APPENDECTOMY  1970's   BREAST BIOPSY Left 05/07/2015   Procedure: BREAST BIOPSY WITH NEEDLE LOCALIZATION;  Surgeon: Dia Crawford III, MD;  Location: [facility] ORS;  Service: General;  Laterality: Left;   BREAST LUMPECTOMY Left 2016   BREAST LUMPECTOMY WITH SENTINEL LYMPH NODE BIOPSY Left 05/07/2015   Procedure: PARTIAL MASTECTOMY WITH SENTINEL LYMPH NODE BX;  Surgeon: Dia Crawford III, MD;  Location: [facility] ORS;  Service: General;  Laterality: Left;   BREAST SURGERY Right 1990   mastectomy   EYE SURGERY Bilateral    cataract extractions   MASTECTOMY Right    1990's   PORT-A-CATH REMOVAL Right 03/07/2017  Procedure: REMOVAL PORT-A-CATH;  Surgeon: Vickie Epley, MD;  Location: [facility] ORS;  Service: General;  Laterality: Right;   Social History   Socioeconomic History   Marital status: Divorced    Spouse name: Not on file   Number of children: Not on file   Years of education: Not on file   Highest education level: Not on file  Occupational History   Not on file  Tobacco Use   Smoking status: Former    Packs/day: 2.00    Years: 25.00    Total pack years: 50.00    Types: Cigarettes    Quit date: 04/28/1992    Years since quitting: 30.2   Smokeless tobacco: Former   Tobacco comments:    Pt quit 1995  Vaping Use   Vaping Use: Never used  Substance and Sexual Activity   Alcohol use: No    Alcohol/week: 0.0 standard drinks of  alcohol   Drug use: No   Sexual activity: Never  Other Topics Concern   Not on file  Social History Narrative   Not on file   Social Determinants of Health   Financial Resource Strain: Low Risk  (12/09/2021)   Overall Financial Resource Strain (CARDIA)    Difficulty of Paying Living Expenses: Not very hard  Food Insecurity: No Food Insecurity (12/09/2021)   Hunger Vital Sign    Worried About Running Out of Food in the Last Year: Never true    Startex in the Last Year: Never true  Transportation Needs: No Transportation Needs (12/09/2021)   PRAPARE - Hydrologist (Medical): No    Lack of Transportation (Non-Medical): No  Physical Activity: Insufficiently Active (12/09/2021)   Exercise Vital Sign    Days of Exercise per Week: 1 day    Minutes of Exercise per Session: 60 min  Stress: No Stress Concern Present (12/09/2021)   [place]    Feeling of Stress : Only a little  Social Connections: Socially Isolated (12/09/2021)   Social Connection and Isolation Panel [NHANES]    Frequency of Communication with Friends and Family: More than three times a week    Frequency of Social Gatherings with Friends and Family: Once a week    Attends Religious Services: Never    Marine scientist or Organizations: No    Attends Archivist Meetings: Never    Marital Status: Divorced  Human resources officer Violence: Not At Risk (12/09/2021)   Humiliation, Afraid, Rape, and Kick questionnaire    Fear of Current or Ex-Partner: No    Emotionally Abused: No    Physically Abused: No    Sexually Abused: No   Family History  Problem Relation Age of Onset   Dementia Mother    Bladder Cancer Father    Diabetes Sister    Breast cancer Neg Hx    Current Outpatient Medications on File Prior to Visit  Medication Sig   alendronate (FOSAMAX) 70 MG tablet TAKE ONE TABLET BY MOUTH ONCE WEEKLY ON AN  EMPTY STOMACH BEFORE BREAKFAST. REMAIN UPRIGHT FOR 30 MINUTES AND TAKE WITH 8 OUNCES OF WATER   b complex vitamins capsule Take 1 capsule by mouth daily.   BucAlfAspKGlucCouchParsUvaUrJu (WATER PILLS PO) Take 1-2 tablets by mouth daily as needed (for fluid retention).   busPIRone (BUSPAR) 30 MG tablet Take 1.5 tablets (45 mg total) by mouth daily.   Calcium Carb-Cholecalciferol (CALCIUM 600 + D PO) Take  1 tablet by mouth 2 (two) times a day.   diclofenac Sodium (VOLTAREN) 1 % GEL APPLY TWO GRAMS TOPICALLY THREE TIMES A DAY AS NEEDED FOR ARTHRITIS PAIN   ipratropium (ATROVENT) 0.06 % nasal spray Place 1 spray into both nostrils daily as needed.   losartan (COZAAR) 100 MG tablet TAKE ONE TABLET BY MOUTH DAILY   Multiple Vitamin (MULTIVITAMIN WITH MINERALS) TABS tablet Take 1 tablet by mouth at bedtime. Senior Multivitamin   NON FORMULARY Take 2 capsules by mouth daily. Dynamic brain   Red Yeast Rice 600 MG CAPS Take 2 capsules by mouth daily.   sertraline (ZOLOFT) 100 MG tablet Take 1.5 tablets (150 mg total) by mouth daily.   tizanidine (ZANAFLEX) 2 MG capsule TAKE 1 TO 2 CAPSULES BY MOUTH 2 TIMES A DAY AS NEEDED FOR MUSCLE SPASMS   traZODone (DESYREL) 100 MG tablet Take 1 tablet (100 mg total) by mouth at bedtime as needed for sleep. Dose adjust according to insomnia symptom   Turmeric 400 MG CAPS Take 1 capsule by mouth 2 (two) times daily.   No current facility-administered medications on file prior to visit.    Review of Systems Per HPI unless specifically indicated above      Objective:    BP (!) 166/78   Pulse 86   Ht '5\' 4"'$  (1.626 m)   Wt 189 lb 8 oz (86 kg)   SpO2 100%   BMI 32.53 kg/m   Wt Readings from Last 3 Encounters:  07/26/22 189 lb 8 oz (86 kg)  06/30/22 188 lb 3.2 oz (85.4 kg)  01/09/22 186 lb 6.4 oz (84.6 kg)    Physical Exam    Results for orders placed or performed in visit on 06/30/22  CBC with Differential/Platelet  Result Value Ref Range   WBC 6.1 4.0  - 10.5 K/uL   RBC 3.77 (L) 3.87 - 5.11 MIL/uL   Hemoglobin 12.8 12.0 - 15.0 g/dL   HCT 38.1 36.0 - 46.0 %   MCV 101.1 (H) 80.0 - 100.0 fL   MCH 34.0 26.0 - 34.0 pg   MCHC 33.6 30.0 - 36.0 g/dL   RDW 11.9 11.5 - 15.5 %   Platelets 241 150 - 400 K/uL   nRBC 0.0 0.0 - 0.2 %   Neutrophils Relative % 59 %   Neutro Abs 3.6 1.7 - 7.7 K/uL   Lymphocytes Relative 29 %   Lymphs Abs 1.7 0.7 - 4.0 K/uL   Monocytes Relative 8 %   Monocytes Absolute 0.5 0.1 - 1.0 K/uL   Eosinophils Relative 3 %   Eosinophils Absolute 0.2 0.0 - 0.5 K/uL   Basophils Relative 1 %   Basophils Absolute 0.1 0.0 - 0.1 K/uL   Immature Granulocytes 0 %   Abs Immature Granulocytes 0.01 0.00 - 0.07 K/uL  Comprehensive metabolic panel  Result Value Ref Range   Sodium 138 135 - 145 mmol/L   Potassium 4.5 3.5 - 5.1 mmol/L   Chloride 103 98 - 111 mmol/L   CO2 26 22 - 32 mmol/L   Glucose, Bld 98 70 - 99 mg/dL   BUN 17 8 - 23 mg/dL   Creatinine, Ser 0.70 0.44 - 1.00 mg/dL   Calcium 9.8 8.9 - 10.3 mg/dL   Total Protein 7.2 6.5 - 8.1 g/dL   Albumin 4.1 3.5 - 5.0 g/dL   AST 26 15 - 41 U/L   ALT 24 0 - 44 U/L   Alkaline Phosphatase 65 38 - 126 U/L  Total Bilirubin 0.5 0.3 - 1.2 mg/dL   GFR, Estimated >60 >60 mL/min   Anion gap 9 5 - 15      Assessment & Plan:   Problem List Items Addressed This Visit     Essential hypertension   Insomnia   Prediabetes   Other Visit Diagnoses     Annual physical exam    -  Primary   Elevated LDL cholesterol level       Vitamin B12 nutritional deficiency           Updated Health Maintenance information Future labs ordered. Encouraged improvement to lifestyle with diet and exercise Goal of weight loss   No orders of the defined types were placed in this encounter.     Follow up plan: No follow-ups on file.  Nobie Putnam, [facility] 07/26/2022, 3:03 PM

## 2022-07-26 NOTE — Patient Instructions (Addendum)
Thank you for coming to the office today.  BP improved today manual reading Recommend Omron arm electronic cuff, it can be done with 1 arm to auto inflate Goal to monitor Bp readings and notify us if new concerns or trends, >140-150 persistently  Updated vaccines  Consider COVID Booster when ready at pharmacy  Future may consider RSV Vaccine but it is optional  No dose change on medications. Continue current, refills available on BP medication.  DUE for FASTING BLOOD WORK (no food or drink after midnight before the lab appointment, only water or coffee without cream/sugar on the morning of)  SCHEDULE "Lab Only" visit in the morning at the clinic for lab draw in 1 week  - Make sure Lab Only appointment is at about 1 week before your next appointment, so that results will be available  For Lab Results, once available within 2-3 days of blood draw, you can can log in to MyChart online to view your results and a brief explanation. Also, we can discuss results at next follow-up visit.   Please schedule a Follow-up Appointment to: Return in about 6 months (around 01/24/2023) for 6 month follow-up HTN, mood/sleep/anxiety.  If you have any other questions or concerns, please feel free to call the office or send a message through [facility]. You may also schedule an earlier appointment if necessary.  Additionally, you may be receiving a survey about your experience at our office within a few days to 1 week by e-mail or mail. We value your feedback.  Nobie Putnam, DO [place]

## 2022-07-27 NOTE — Assessment & Plan Note (Signed)
Due for lab A1c Improving lifestyle

## 2022-07-27 NOTE — Assessment & Plan Note (Signed)
Mildly elevated initial BP, repeat manual check improved. - Home BP readings reviewed   No known complications    Plan:  1.  Continue current BP regimen Losartan '100mg'$  daily 2. Encourage improved lifestyle - low sodium diet, regular exercise 3. Continue monitor BP outside office, bring readings to next visit, if persistently >140/90 or new symptoms notify office sooner

## 2022-07-27 NOTE — Assessment & Plan Note (Addendum)
Stable chronic problem On Trazodone '50mg'$  PRN - usually takes half of '100mg'$ 

## 2022-07-28 DIAGNOSIS — Z961 Presence of intraocular lens: Secondary | ICD-10-CM | POA: Diagnosis not present

## 2022-07-30 ENCOUNTER — Other Ambulatory Visit: Payer: Self-pay | Admitting: Oncology

## 2022-08-03 ENCOUNTER — Other Ambulatory Visit: Payer: Self-pay

## 2022-08-03 DIAGNOSIS — E538 Deficiency of other specified B group vitamins: Secondary | ICD-10-CM

## 2022-08-03 DIAGNOSIS — R7303 Prediabetes: Secondary | ICD-10-CM

## 2022-08-03 DIAGNOSIS — I10 Essential (primary) hypertension: Secondary | ICD-10-CM

## 2022-08-03 DIAGNOSIS — E7800 Pure hypercholesterolemia, unspecified: Secondary | ICD-10-CM

## 2022-08-03 DIAGNOSIS — Z0000 Encounter for general adult medical examination without abnormal findings: Secondary | ICD-10-CM

## 2022-08-04 ENCOUNTER — Other Ambulatory Visit: Payer: Medicare Other

## 2022-08-04 DIAGNOSIS — R7303 Prediabetes: Secondary | ICD-10-CM | POA: Diagnosis not present

## 2022-08-04 DIAGNOSIS — Z0000 Encounter for general adult medical examination without abnormal findings: Secondary | ICD-10-CM | POA: Diagnosis not present

## 2022-08-04 DIAGNOSIS — I10 Essential (primary) hypertension: Secondary | ICD-10-CM | POA: Diagnosis not present

## 2022-08-04 DIAGNOSIS — E7800 Pure hypercholesterolemia, unspecified: Secondary | ICD-10-CM | POA: Diagnosis not present

## 2022-08-04 DIAGNOSIS — E538 Deficiency of other specified B group vitamins: Secondary | ICD-10-CM | POA: Diagnosis not present

## 2022-08-08 LAB — LIPID PANEL
Cholesterol: 185 mg/dL (ref <200)
HDL: 81 mg/dL (ref >=50)
LDL Cholesterol (Calc): 82 mg/dL (calc)
Non-HDL Cholesterol (Calc): 104 mg/dL (calc) (ref <130)
Total CHOL/HDL Ratio: 2.3 (calc) (ref <5.0)
Triglycerides: 122 mg/dL (ref <150)

## 2022-08-08 LAB — VITAMIN B12: Vitamin B-12: 647 pg/mL (ref 200–1100)

## 2022-08-08 LAB — HEMOGLOBIN A1C
Hgb A1c MFr Bld: 5.1 % of total Hgb (ref <5.7)
Mean Plasma Glucose: 100 mg/dL
eAG (mmol/L): 5.5 mmol/L

## 2022-08-08 LAB — TSH: TSH: 2.88 mIU/L (ref 0.40–4.50)

## 2022-09-01 ENCOUNTER — Ambulatory Visit: Payer: Medicare Other | Admitting: Family Medicine

## 2022-09-01 ENCOUNTER — Encounter: Payer: Self-pay | Admitting: Family Medicine

## 2022-09-01 ENCOUNTER — Telehealth (INDEPENDENT_AMBULATORY_CARE_PROVIDER_SITE_OTHER): Payer: Medicare Other | Admitting: Family Medicine

## 2022-09-01 VITALS — Ht 64.0 in | Wt 189.0 lb

## 2022-09-01 DIAGNOSIS — J0110 Acute frontal sinusitis, unspecified: Secondary | ICD-10-CM | POA: Diagnosis not present

## 2022-09-01 DIAGNOSIS — J069 Acute upper respiratory infection, unspecified: Secondary | ICD-10-CM

## 2022-09-01 MED ORDER — PREDNISONE 10 MG PO TABS: ORAL_TABLET | ORAL | 0 refills | Status: DC

## 2022-09-01 MED ORDER — AMOXICILLIN-POT CLAVULANATE 875-125 MG PO TABS: 1.0000 | ORAL_TABLET | Freq: Two times a day (BID) | ORAL | 0 refills | Status: DC

## 2022-09-01 MED ORDER — HYDROCOD POLI-CHLORPHE POLI ER 10-8 MG/5ML PO SUER: 5.0000 mL | Freq: Two times a day (BID) | ORAL | 0 refills | Status: DC | PRN

## 2022-09-01 NOTE — Patient Instructions (Addendum)
Start Prednisone taper Add Cough syrup at night, caution with sedation If not improved by next week may start Augmentin for infection   Please schedule a Follow-up Appointment to: Return in about 1 week (around 09/08/2022), or if symptoms worsen or fail to improve.  If you have any other questions or concerns, please feel free to call the office or send a message through [facility]. You may also schedule an earlier appointment if necessary.  Additionally, you may be receiving a survey about your experience at our office within a few days to 1 week by e-mail or mail. We value your feedback.  Nobie Putnam, DO [place]

## 2022-09-01 NOTE — Progress Notes (Signed)
Subjective:    Patient ID: Heidi [name], female    DOB: Nov 01, 1940, 81 y.o.   MRN: [id number]  Heidi [name] is a 81 y.o. female presenting on 09/01/2022 for Cough and chest congestion  Virtual / Telehealth Encounter - Video Visit via MyChart The purpose of this virtual visit is to provide medical care while limiting exposure to the novel coronavirus (COVID19) for both patient and office staff.  Consent was obtained for remote visit:  Yes.   Answered questions that patient had about telehealth interaction:  Yes.   I discussed the limitations, risks, security and privacy concerns of performing an evaluation and management service by video/telephone. I also discussed with the patient that there may be a patient responsible charge related to this service. The patient expressed understanding and agreed to proceed.  Patient Location: Home Provider Location: [facility] (Office)  Participants in virtual visit: - Patient: Heidi [name] - CMA: Orinda Kenner, CMA - Provider: Dr Parks Ranger   HPI  [place] Recent viral illness 1 week ago, she admits trip to [place] for Grandparents day and said everyone who attended got sick. Worse with cough and wet productive cough symptoms, some sneezing, difficulty laying down due to cough. Temp of 100.89F. Taking Tylenol AS NEEDED, taking OTC dextromethorphan and guafenesin No obvious dyspnea, but worse breathing when coughing. Worse at night. No COVID testing completed Multiple sick contacts No recent antibiotic course     07/26/2022    2:41 PM 01/09/2022    3:45 PM 12/09/2021    2:42 PM  Depression screen PHQ 2/9  Decreased Interest 1 1 0  Down, Depressed, Hopeless 1 1 0  PHQ - 2 Score 2 2 0  Altered sleeping 0 0   Tired, decreased energy 0 0   Change in appetite 0 0   Feeling bad or failure about yourself  0 0   Trouble concentrating 0 0   Moving slowly or fidgety/restless 0 0   Suicidal thoughts 0 0    PHQ-9 Score 2 2   Difficult doing work/chores Not difficult at all Not difficult at all     Social History   Tobacco Use   Smoking status: Former    Packs/day: 2.00    Years: 25.00    Total pack years: 50.00    Types: Cigarettes    Quit date: 04/28/1992    Years since quitting: 30.3   Smokeless tobacco: Former   Tobacco comments:    Pt quit 1995  Vaping Use   Vaping Use: Never used  Substance Use Topics   Alcohol use: No    Alcohol/week: 0.0 standard drinks of alcohol   Drug use: No    Review of Systems Per HPI unless specifically indicated above     Objective:    Ht '5\' 4"'$  (1.626 m)   Wt 189 lb (85.7 kg)   BMI 32.44 kg/m   Wt Readings from Last 3 Encounters:  09/01/22 189 lb (85.7 kg)  07/26/22 189 lb 8 oz (86 kg)  06/30/22 188 lb 3.2 oz (85.4 kg)    Physical Exam  Note examination was completely remotely via video observation objective data only  Gen - well-appearing, no acute distress or apparent pain, comfortable HEENT - eyes appear clear without discharge or redness Heart/Lungs - cannot examine virtually - occasional cough, speaks full sentences. Abd - cannot examine virtually  Skin - face visible today- no rash Neuro - awake, alert, oriented Psych -  not anxious appearing  Results for orders placed or performed in visit on 08/03/22  Vitamin B12  Result Value Ref Range   Vitamin B-12 647 200 - 1,100 pg/mL  TSH  Result Value Ref Range   TSH 2.88 0.40 - 4.50 mIU/L  Hemoglobin A1c  Result Value Ref Range   Hgb A1c MFr Bld 5.1 <5.7 % of total Hgb   Mean Plasma Glucose 100 mg/dL   eAG (mmol/L) 5.5 mmol/L  Lipid panel  Result Value Ref Range   Cholesterol 185 <200 mg/dL   HDL 81 > OR = 50 mg/dL   Triglycerides 122 <150 mg/dL   LDL Cholesterol (Calc) 82 mg/dL (calc)   Total CHOL/HDL Ratio 2.3 <5.0 (calc)   Non-HDL Cholesterol (Calc) 104 <130 mg/dL (calc)      Assessment & Plan:   Problem List Items Addressed This Visit   None Visit Diagnoses      Viral URI with cough    -  Primary   Relevant Medications   predniSONE (DELTASONE) 10 MG tablet   chlorpheniramine-HYDROcodone (TUSSIONEX) 10-8 MG/5ML   Acute non-recurrent frontal sinusitis       Relevant Medications   predniSONE (DELTASONE) 10 MG tablet   chlorpheniramine-HYDROcodone (TUSSIONEX) 10-8 MG/5ML   amoxicillin-clavulanate (AUGMENTIN) 875-125 MG tablet       Consistent with viral URI post-viral cough symptoms x 1 week  multiple known sick contacts (similar URI symptoms). No covid testing completed, now >1 week duration Low grade temp not fever On Tylenol AS NEEDED No dyspnea  Plan: Start Prednisone 6 day taper Start Tussionex cough syrup at night AS NEEDED to help sleep Add Augmentin course for sinus if not resolved >10 days, start next week if not improved Keep on OTC regimen  Future consider X-ray f/u  Return criteria given    Meds ordered this encounter  Medications   predniSONE (DELTASONE) 10 MG tablet    Sig: Take 6 tabs with breakfast Day 1, 5 tabs Day 2, 4 tabs Day 3, 3 tabs Day 4, 2 tabs Day 5, 1 tab Day 6.    Dispense:  21 tablet    Refill:  0   chlorpheniramine-HYDROcodone (TUSSIONEX) 10-8 MG/5ML    Sig: Take 5 mLs by mouth every 12 (twelve) hours as needed for cough.    Dispense:  115 mL    Refill:  0   amoxicillin-clavulanate (AUGMENTIN) 875-125 MG tablet    Sig: Take 1 tablet by mouth 2 (two) times daily.    Dispense:  20 tablet    Refill:  0      Follow up plan: Return in about 1 week (around 09/08/2022), or if symptoms worsen or fail to improve.  Patient verbalizes understanding with the above medical recommendations including the limitation of remote medical advice.  Specific follow-up and call-back criteria were given for patient to follow-up or seek medical care more urgently if needed.  Total duration of direct patient care provided via video conference: 10 minutes   Heidi [name], [facility] 09/01/2022, 1:54 PM

## 2022-10-20 ENCOUNTER — Other Ambulatory Visit: Payer: Self-pay | Admitting: Oncology

## 2022-11-20 ENCOUNTER — Inpatient Hospital Stay: Payer: Medicare Other | Attending: Oncology | Admitting: Oncology

## 2022-11-20 ENCOUNTER — Encounter: Payer: Self-pay | Admitting: Oncology

## 2022-11-20 VITALS — BP 152/97 | HR 85 | Temp 98.0°F | Resp 17 | Wt 192.0 lb

## 2022-11-20 DIAGNOSIS — Z8052 Family history of malignant neoplasm of bladder: Secondary | ICD-10-CM | POA: Insufficient documentation

## 2022-11-20 DIAGNOSIS — Z923 Personal history of irradiation: Secondary | ICD-10-CM | POA: Insufficient documentation

## 2022-11-20 DIAGNOSIS — M810 Age-related osteoporosis without current pathological fracture: Secondary | ICD-10-CM | POA: Diagnosis not present

## 2022-11-20 DIAGNOSIS — I10 Essential (primary) hypertension: Secondary | ICD-10-CM | POA: Insufficient documentation

## 2022-11-20 DIAGNOSIS — Z853 Personal history of malignant neoplasm of breast: Secondary | ICD-10-CM | POA: Insufficient documentation

## 2022-11-20 DIAGNOSIS — R0789 Other chest pain: Secondary | ICD-10-CM | POA: Diagnosis not present

## 2022-11-20 DIAGNOSIS — Z87891 Personal history of nicotine dependence: Secondary | ICD-10-CM | POA: Diagnosis not present

## 2022-11-20 NOTE — Progress Notes (Signed)
Patient here for oncology follow-up appointment, concerns of breast pain

## 2022-11-20 NOTE — Progress Notes (Signed)
Hematology/Oncology Consult note [facility]  Telephone:([phone] [phone]  Patient Care Team: Olin Hauser, DO as PCP - General (Family Medicine)   Name of the patient: Heidi [name]  [id number]  [birth year]   Date of visit: 11/20/22  Diagnosis- stage II left breast cancer ER/PR positive and HER-2/neu negative    Chief complaint/ Reason for visit-acute visit for left chest wall pain  Heme/Onc history:  Oncology History Overview Note  # March 2016- LEFT BREAST CANCER STAGE II ER- 90%; PR-90%; Her 2 neu- NEG; NEO-ADJ CHEMO-  AC x4- Taxol x12 w;;s/p LUMPEC & SLNBx [ypT1 [1.5cm]; ypsN 3/6 (2 macro & 1 micro met)]  s/p RT [Nov 1st week;finish] NOV 2016- START FEMARA stop Feb 2017; April 2017- start Arimidex  # Right Breast cancer ? Stage;  s/p Mastec [1990;s/p Chemo; RT ]  # PN G-2 on neurontin; Hx PE on eliquis  # Mediport   Cancer of overlapping sites of left female breast ([place]) (Resolved)  06/09/2016 Initial Diagnosis   Cancer of overlapping sites of left female breast [facility])    Patient completed 5 years of Arimidex in November 2021.  She is on weekly Fosamax for osteoporosis    Interval history-patient reports pain under her left breast which has been on and off for the last 6 to 8 weeks.  She has been using Tylenol and occasional NSAIDs and pain is gradually getting better but has not resolved all the way.  ECOG PS- 2 Pain scale- 3 Opioid associated constipation- no  Review of systems- Review of Systems  Constitutional:  Negative for chills, fever, malaise/fatigue and weight loss.  HENT:  Negative for congestion, ear discharge and nosebleeds.   Eyes:  Negative for blurred vision.  Respiratory:  Negative for cough, hemoptysis, sputum production, shortness of breath and wheezing.        Left chest wall pain  Cardiovascular:  Negative for chest pain, palpitations, orthopnea and claudication.  Gastrointestinal:  Negative for  abdominal pain, blood in stool, constipation, diarrhea, heartburn, melena, nausea and vomiting.  Genitourinary:  Negative for dysuria, flank pain, frequency, hematuria and urgency.  Musculoskeletal:  Negative for back pain, joint pain and myalgias.  Skin:  Negative for rash.  Neurological:  Negative for dizziness, tingling, focal weakness, seizures, weakness and headaches.  Endo/Heme/Allergies:  Does not bruise/bleed easily.  Psychiatric/Behavioral:  Negative for depression and suicidal ideas. The patient does not have insomnia.       Allergies  Allergen Reactions   Other Anaphylaxis    Uncoded Allergy. Allergen: HYDROCHLORLIC ACID- causes water blisters   Diphenhydramine Other (See Comments)    pt states makes legs very restless and ache   Hydrochloric Acid Hives    Other reaction(s): Blisters Uncoded Allergy. Allergen: HYDROCHLORLIC ACID.     Past Medical History:  Diagnosis Date   Anemia    Anxiety    Breast cancer ([place]) 1990   RT MASTECTOMY   C. difficile diarrhea    see dr Vira Agar and this is her second course of vancomycin   Cataracts, bilateral    Depression    Dyspnea    with exersion   Hepatitis 1960's   does not know which type   Hyperlipidemia    Hypertension    Insomnia    Neuropathy due to chemotherapeutic drug ([place])    Osteoarthritis    Osteoporosis    Personal history of radiation therapy 2016   LEFT lumpectomy     Past Surgical  History:  Procedure Laterality Date   APPENDECTOMY  1970's   BREAST BIOPSY Left 05/07/2015   Procedure: BREAST BIOPSY WITH NEEDLE LOCALIZATION;  Surgeon: Dia Crawford III, MD;  Location: [facility] ORS;  Service: General;  Laterality: Left;   BREAST LUMPECTOMY Left 2016   BREAST LUMPECTOMY WITH SENTINEL LYMPH NODE BIOPSY Left 05/07/2015   Procedure: PARTIAL MASTECTOMY WITH SENTINEL LYMPH NODE BX;  Surgeon: Dia Crawford III, MD;  Location: [facility] ORS;  Service: General;  Laterality: Left;   BREAST SURGERY Right 1990   mastectomy   EYE  SURGERY Bilateral    cataract extractions   MASTECTOMY Right    1990's   PORT-A-CATH REMOVAL Right 03/07/2017   Procedure: REMOVAL PORT-A-CATH;  Surgeon: Vickie Epley, MD;  Location: [facility] ORS;  Service: General;  Laterality: Right;    Social History   Socioeconomic History   Marital status: Divorced    Spouse name: Not on file   Number of children: Not on file   Years of education: Not on file   Highest education level: Not on file  Occupational History   Not on file  Tobacco Use   Smoking status: Former    Packs/day: 2.00    Years: 25.00    Total pack years: 50.00    Types: Cigarettes    Quit date: 04/28/1992    Years since quitting: 30.5   Smokeless tobacco: Former   Tobacco comments:    Pt quit 1995  Vaping Use   Vaping Use: Never used  Substance and Sexual Activity   Alcohol use: No    Alcohol/week: 0.0 standard drinks of alcohol   Drug use: No   Sexual activity: Never  Other Topics Concern   Not on file  Social History Narrative   Not on file   Social Determinants of Health   Financial Resource Strain: Low Risk  (12/09/2021)   Overall Financial Resource Strain (CARDIA)    Difficulty of Paying Living Expenses: Not very hard  Food Insecurity: No Food Insecurity (12/09/2021)   Hunger Vital Sign    Worried About Running Out of Food in the Last Year: Never true    Ran Out of Food in the Last Year: Never true  Transportation Needs: No Transportation Needs (12/09/2021)   PRAPARE - Hydrologist (Medical): No    Lack of Transportation (Non-Medical): No  Physical Activity: Insufficiently Active (12/09/2021)   Exercise Vital Sign    Days of Exercise per Week: 1 day    Minutes of Exercise per Session: 60 min  Stress: No Stress Concern Present (12/09/2021)   [place]    Feeling of Stress : Only a little  Social Connections: Socially Isolated (12/09/2021)   Social  Connection and Isolation Panel [NHANES]    Frequency of Communication with Friends and Family: More than three times a week    Frequency of Social Gatherings with Friends and Family: Once a week    Attends Religious Services: Never    Marine scientist or Organizations: No    Attends Archivist Meetings: Never    Marital Status: Divorced  Human resources officer Violence: Not At Risk (12/09/2021)   Humiliation, Afraid, Rape, and Kick questionnaire    Fear of Current or Ex-Partner: No    Emotionally Abused: No    Physically Abused: No    Sexually Abused: No    Family History  Problem Relation Age of Onset   Dementia  Mother    Bladder Cancer Father    Diabetes Sister    Breast cancer Neg Hx      Current Outpatient Medications:    alendronate (FOSAMAX) 70 MG tablet, TAKE 1 TABLET BY MOUTH ONCE WEEKLY ON AN EMPTY STOMACH BEFORE BREAKFAST. REMAIN UPRIGHT FOR 30 MINUTES AND TAKE WITH 8 OUNCES OF WATER, Disp: 12 tablet, Rfl: 0   amoxicillin-clavulanate (AUGMENTIN) 875-125 MG tablet, Take 1 tablet by mouth 2 (two) times daily., Disp: 20 tablet, Rfl: 0   b complex vitamins capsule, Take 1 capsule by mouth daily., Disp: , Rfl:    BucAlfAspKGlucCouchParsUvaUrJu (WATER PILLS PO), Take 1-2 tablets by mouth daily as needed (for fluid retention)., Disp: , Rfl:    busPIRone (BUSPAR) 30 MG tablet, Take 1.5 tablets (45 mg total) by mouth daily., Disp: 135 tablet, Rfl: 3   Calcium Carb-Cholecalciferol (CALCIUM 600 + D PO), Take 1 tablet by mouth 2 (two) times a day., Disp: , Rfl:    chlorpheniramine-HYDROcodone (TUSSIONEX) 10-8 MG/5ML, Take 5 mLs by mouth every 12 (twelve) hours as needed for cough., Disp: 115 mL, Rfl: 0   diclofenac Sodium (VOLTAREN) 1 % GEL, APPLY TWO GRAMS TOPICALLY THREE TIMES A DAY AS NEEDED FOR ARTHRITIS PAIN, Disp: 100 g, Rfl: 3   ipratropium (ATROVENT) 0.06 % nasal spray, Place 1 spray into both nostrils daily as needed., Disp: , Rfl:    losartan (COZAAR) 100 MG tablet,  Take 1 tablet (100 mg total) by mouth daily., Disp: 90 tablet, Rfl: 3   Multiple Vitamin (MULTIVITAMIN WITH MINERALS) TABS tablet, Take 1 tablet by mouth at bedtime. Senior Multivitamin, Disp: , Rfl:    NON FORMULARY, Take 2 capsules by mouth daily. Dynamic brain, Disp: , Rfl:    predniSONE (DELTASONE) 10 MG tablet, Take 6 tabs with breakfast Day 1, 5 tabs Day 2, 4 tabs Day 3, 3 tabs Day 4, 2 tabs Day 5, 1 tab Day 6., Disp: 21 tablet, Rfl: 0   Red Yeast Rice 600 MG CAPS, Take 2 capsules by mouth daily., Disp: , Rfl:    sertraline (ZOLOFT) 100 MG tablet, Take 1.5 tablets (150 mg total) by mouth daily., Disp: 135 tablet, Rfl: 3   tizanidine (ZANAFLEX) 2 MG capsule, TAKE 1 TO 2 CAPSULES BY MOUTH 2 TIMES A DAY AS NEEDED FOR MUSCLE SPASMS, Disp: 360 capsule, Rfl: 1   traZODone (DESYREL) 100 MG tablet, Take 0.5-1 tablets (50-100 mg total) by mouth at bedtime as needed for sleep. Dose adjust according to insomnia symptom, Disp: 90 tablet, Rfl: 1   Turmeric 400 MG CAPS, Take 1 capsule by mouth 2 (two) times daily., Disp: , Rfl:   Physical exam:  Vitals:   11/20/22 1317  BP: (!) 152/97  Pulse: 85  Resp: 17  Temp: 98 F (36.7 C)  TempSrc: Tympanic  SpO2: 99%  Weight: 192 lb (87.1 kg)   Physical Exam Cardiovascular:     Rate and Rhythm: Normal rate and regular rhythm.     Heart sounds: Normal heart sounds.  Pulmonary:     Effort: Pulmonary effort is normal.     Breath sounds: Normal breath sounds.  Skin:    General: Skin is warm and dry.  Neurological:     Mental Status: She is alert and oriented to person, place, and time.   Chest wall exam: Patient is s/p right mastectomy with no evidence of chest wall recurrence.  No palpable masses in the left breast.  There is mild soft tissue swelling noted  around the costochondral junction and extending laterally     Latest Ref Rng & Units 06/30/2022   12:40 PM  CMP  Glucose 70 - 99 mg/dL 98   BUN 8 - 23 mg/dL 17   Creatinine 0.44 - 1.00 mg/dL  0.70   Sodium 135 - 145 mmol/L 138   Potassium 3.5 - 5.1 mmol/L 4.5   [place] 98 - 111 mmol/L 103   CO2 22 - 32 mmol/L 26   Calcium 8.9 - 10.3 mg/dL 9.8   Total Protein 6.5 - 8.1 g/dL 7.2   Total Bilirubin 0.3 - 1.2 mg/dL 0.5   Alkaline Phos 38 - 126 U/L 65   AST 15 - 41 U/L 26   ALT 0 - 44 U/L 24       Latest Ref Rng & Units 06/30/2022   12:40 PM  CBC  WBC 4.0 - 10.5 K/uL 6.1   Hemoglobin 12.0 - 15.0 g/dL 12.8   Hematocrit 36.0 - 46.0 % 38.1   Platelets 150 - 400 K/uL 241     No images are attached to the encounter.  No results found.   Assessment and plan- Patient is a 82 y.o. female  with history of left breast cancer stage I status post lumpectomy, adjuvant radiation therapy and 5 years of Arimidex as well as right mastectomy.  She is here for acute visit for ongoing left chest wall pain  Patient has mild soft tissue swelling noted at the costochondral junction on the left side and extending laterally and along the undersurface of the left breast.  There are no palpable masses in the left breast itself.  There is tenderness to palpation along this area.  I suspect this pain is musculoskeletal and does not require any imaging at this time.  However if her pain does not get better in the next 2 to 3 weeks patient will let us know and we will consider starting off with chest x-ray.  She will inform us as to how her pain is doing.  She will keep her appointment with me in September 2024 as planned   Visit Diagnosis 1. Chest wall pain      Dr. Randa Evens, MD, MPH [facility] [phone] 11/20/2022 1:29 PM

## 2022-12-05 ENCOUNTER — Telehealth: Payer: Self-pay | Admitting: *Deleted

## 2022-12-05 ENCOUNTER — Other Ambulatory Visit: Payer: Self-pay | Admitting: *Deleted

## 2022-12-05 DIAGNOSIS — R0781 Pleurodynia: Secondary | ICD-10-CM

## 2022-12-05 DIAGNOSIS — R0789 Other chest pain: Secondary | ICD-10-CM

## 2022-12-05 NOTE — Telephone Encounter (Signed)
Sure.  It looks like she is scheduled to see me for routine follow-up first of April.  If she has X-ray done, I am happy to follow up with her here in office anytime soon to discuss other causes of chest wall pain.  Raquel Sarna - can you call to schedule?  Nobie Putnam, [facility] 12/05/2022, 11:57 AM

## 2022-12-05 NOTE — Telephone Encounter (Signed)
I called the pt and got her voicemail that he got the message that dr Janese Banks got her message about the pain on left lower breast and left rib. Dr. Janese Banks contacted Dr. Parks Ranger and wanted to see if he can help. Dr. Janese Banks did put in xray and it does  not require an appt. I see that you have a appt with PCP tom 1:40. If you can get the chest  xray about 12 noon . Hopefully that it would read before you get the appt at PCP. Call me if you need me [phone]

## 2022-12-05 NOTE — Telephone Encounter (Signed)
Dr. Parks Ranger- can you please look into it? I dont think her chest wall pain is related to breast cancer. Judeen Hammans- we can arrange for CXR in the meanwhile

## 2022-12-05 NOTE — Telephone Encounter (Signed)
Patient called reporting that her chest is not any better and she wants a CXR done as per Dr Elroy Channel office note.

## 2022-12-06 ENCOUNTER — Other Ambulatory Visit: Payer: Self-pay | Admitting: Family Medicine

## 2022-12-06 ENCOUNTER — Ambulatory Visit (INDEPENDENT_AMBULATORY_CARE_PROVIDER_SITE_OTHER): Payer: Medicare Other | Admitting: Family Medicine

## 2022-12-06 ENCOUNTER — Ambulatory Visit
Admission: RE | Admit: 2022-12-06 | Discharge: 2022-12-06 | Disposition: A | Discharge status: Home / Self Care | Payer: Medicare Other | Attending: Oncology | Admitting: Oncology

## 2022-12-06 ENCOUNTER — Encounter: Payer: Self-pay | Admitting: Family Medicine

## 2022-12-06 ENCOUNTER — Ambulatory Visit
Admission: RE | Admit: 2022-12-06 | Discharge: 2022-12-06 | Disposition: A | Discharge status: Home / Self Care | Payer: Medicare Other | Source: Ambulatory Visit | Attending: Oncology | Admitting: Oncology

## 2022-12-06 VITALS — BP 130/80 | HR 53 | Ht 64.0 in | Wt 188.4 lb

## 2022-12-06 DIAGNOSIS — R0781 Pleurodynia: Secondary | ICD-10-CM | POA: Insufficient documentation

## 2022-12-06 DIAGNOSIS — J310 Chronic rhinitis: Secondary | ICD-10-CM

## 2022-12-06 DIAGNOSIS — F411 Generalized anxiety disorder: Secondary | ICD-10-CM

## 2022-12-06 DIAGNOSIS — G250 Essential tremor: Secondary | ICD-10-CM

## 2022-12-06 DIAGNOSIS — M940 Chondrocostal junction syndrome [Tietze]: Secondary | ICD-10-CM | POA: Diagnosis not present

## 2022-12-06 DIAGNOSIS — R0789 Other chest pain: Secondary | ICD-10-CM | POA: Diagnosis not present

## 2022-12-06 DIAGNOSIS — R079 Chest pain, unspecified: Secondary | ICD-10-CM | POA: Diagnosis not present

## 2022-12-06 DIAGNOSIS — C50919 Malignant neoplasm of unspecified site of unspecified female breast: Secondary | ICD-10-CM | POA: Diagnosis not present

## 2022-12-06 MED ORDER — IPRATROPIUM BROMIDE 0.06 % NA SOLN: 1.0000 | Freq: Every day | NASAL | 3 refills | Status: AC | PRN

## 2022-12-06 MED ORDER — BUSPIRONE HCL 30 MG PO TABS: 60.0000 mg | ORAL_TABLET | Freq: Every day | ORAL | 3 refills | Status: DC

## 2022-12-06 MED ORDER — BENZTROPINE MESYLATE 1 MG PO TABS: 1.0000 mg | ORAL_TABLET | Freq: Two times a day (BID) | ORAL | 5 refills | Status: AC | PRN

## 2022-12-06 NOTE — Progress Notes (Unsigned)
Subjective:    Patient ID: Heidi [name], female    DOB: 1941/07/04, 82 y.o.   MRN: [id number]  Heidi [name] is a 82 y.o. female presenting on 12/06/2022 for Chest Pain and Abdominal Pain   HPI  Costochondritis Chest Wall Pain, atypical  History Left Breast Cancer Followed by [facility], sent here for X-ray today and office visit   Under left breast chest wall Describing symptoms onset since November 2023. She describes fairly constant aching mild pain, she can still function but may be gradually worsening now. Does not affect her breathing and not feeling winded. No sharp stabbing radiating burning. Recent history 11/3 Viral URI with significant coughing spells that caused some pain in chest wall. Admits localized tender  Tried Voltaren and it does help Tylenol '650mg'$  x 2 twice a day with relief      07/26/2022    2:41 PM 01/09/2022    3:45 PM 12/09/2021    2:42 PM  Depression screen PHQ 2/9  Decreased Interest 1 1 0  Down, Depressed, Hopeless 1 1 0  PHQ - 2 Score 2 2 0  Altered sleeping 0 0   Tired, decreased energy 0 0   Change in appetite 0 0   Feeling bad or failure about yourself  0 0   Trouble concentrating 0 0   Moving slowly or fidgety/restless 0 0   Suicidal thoughts 0 0   PHQ-9 Score 2 2   Difficult doing work/chores Not difficult at all Not difficult at all     Social History   Tobacco Use   Smoking status: Former    Packs/day: 2.00    Years: 25.00    Total pack years: 50.00    Types: Cigarettes    Quit date: 04/28/1992    Years since quitting: 30.6   Smokeless tobacco: Former   Tobacco comments:    Pt quit 1995  Vaping Use   Vaping Use: Never used  Substance Use Topics   Alcohol use: No    Alcohol/week: 0.0 standard drinks of alcohol   Drug use: No    Review of Systems Per HPI unless specifically indicated above     Objective:    BP 130/80   Pulse (!) 53   Ht '5\' 4"'$  (1.626 m)   Wt 188 lb 6.4 oz (85.5 kg)   SpO2 96%   BMI 32.34 kg/m    Wt Readings from Last 3 Encounters:  12/06/22 188 lb 6.4 oz (85.5 kg)  11/20/22 192 lb (87.1 kg)  09/01/22 189 lb (85.7 kg)    Physical Exam Vitals and nursing note reviewed.  Constitutional:      General: She is not in acute distress.    Appearance: Normal appearance. She is well-developed. She is not diaphoretic.     Comments: Well-appearing, comfortable, cooperative  HENT:     Head: Normocephalic and atraumatic.  Eyes:     General:        Right eye: No discharge.        Left eye: No discharge.     Conjunctiva/sclera: Conjunctivae normal.  Cardiovascular:     Rate and Rhythm: Bradycardia present. Rhythm irregular.     Pulses: Normal pulses.     Heart sounds: Normal heart sounds. No murmur heard. Pulmonary:     Effort: Pulmonary effort is normal. No respiratory distress.     Breath sounds: Normal breath sounds. No wheezing, rhonchi or rales.  Chest:     Chest wall:  Tenderness (left lower costa cartilage only localized.) present.  Skin:    General: Skin is warm and dry.     Findings: No erythema or rash.  Neurological:     Mental Status: She is alert and oriented to person, place, and time.  Psychiatric:        Mood and Affect: Mood normal.        Behavior: Behavior normal.        Thought Content: Thought content normal.     Comments: Well groomed, good eye contact, normal speech and thoughts    Results for orders placed or performed in visit on 08/03/22  Vitamin B12  Result Value Ref Range   Vitamin B-12 647 200 - 1,100 pg/mL  TSH  Result Value Ref Range   TSH 2.88 0.40 - 4.50 mIU/L  Hemoglobin A1c  Result Value Ref Range   Hgb A1c MFr Bld 5.1 <5.7 % of total Hgb   Mean Plasma Glucose 100 mg/dL   eAG (mmol/L) 5.5 mmol/L  Lipid panel  Result Value Ref Range   Cholesterol 185 <200 mg/dL   HDL 81 > OR = 50 mg/dL   Triglycerides 122 <150 mg/dL   LDL Cholesterol (Calc) 82 mg/dL (calc)   Total CHOL/HDL Ratio 2.3 <5.0 (calc)   Non-HDL Cholesterol (Calc) 104 <130  mg/dL (calc)      Assessment & Plan:   Problem List Items Addressed This Visit   None Visit Diagnoses     Costochondritis    -  Primary       Reassurance today. She was recommended to follow up with [place] and have CXR after recent Oncology evaluation for chest wall pain. That was determined to be unlikely Cancer. She has history of L Breast cancer.  Unlikely to be Cardiac, Cancer, or Pulmonary  Likely Costochondritis / cartilage pain of the rib cage based on location, positional and provoked pain.  X-ray was done by Dr Janese Banks, pending result. On mychart pending result.  Try Ibuprofen 2 to 3 pills 400-'600mg'$  with a meal, 2- 3 times a day for 1 week. While on this, can HOLD the topical Voltaren diclofenac OK to still take Tylenol   No orders of the defined types were placed in this encounter.     Follow up plan: Return if symptoms worsen or fail to improve, for costochondritis.   Nobie Putnam, [facility] 12/06/2022, 2:00 PM

## 2022-12-06 NOTE — Patient Instructions (Addendum)
Thank you for coming to the office today.  Unlikely to be Cardiac, Cancer, or Pulmonary  Likely Costochondritis / cartilage pain of the rib cage  X-ray was done by Dr Janese Banks, pending result. On mychart.  Try Ibuprofen 2 to 3 pills 400-'600mg'$  with a meal, 2- 3 times a day for 1 week. While on this, can HOLD the topical Voltaren diclofenac OK to still take Tylenol   Costochondritis  Costochondritis is inflammation of the tissue (cartilage) that connects the ribs to the breastbone (sternum). This causes pain in the front of the chest. The pain usually starts slowly and involves more than one rib. What are the causes? The exact cause of this condition is not always known. It results from stress on the cartilage where your ribs attach to your sternum. The cause of this stress could be: Chest injury. Exercise or activity, such as lifting. Severe coughing. What increases the risk? You are more likely to develop this condition if you: Are female. Are 81-15 years old. Recently started a new exercise or work activity. Have low levels of vitamin D. Have a condition that makes you cough frequently. What are the signs or symptoms? The main symptom of this condition is chest pain. The pain: Usually starts gradually and can be sharp or dull. Gets worse with deep breathing, coughing, or exercise. Gets better with rest. May be worse when you press on the affected area of your ribs and sternum. How is this diagnosed? This condition is diagnosed based on your symptoms, your medical history, and a physical exam. Your health care provider will check for pain when pressing on your sternum. You may also have tests to rule out other causes of chest pain. These may include: A chest X-ray to check for lung problems. An ECG (electrocardiogram) to see if you have a heart problem that could be causing the pain. An imaging scan to rule out a chest or rib fracture. How is this treated? This condition usually  goes away on its own over time. Your health care provider may prescribe an NSAID, such as ibuprofen, to reduce pain and inflammation. Treatment may also include: Resting and avoiding activities that make pain worse. Applying heat or ice to the area to reduce pain and inflammation. Doing exercises to stretch your chest muscles. If these treatments do not help, your health care provider may inject a numbing medicine at the sternum-rib connection to help relieve the pain. Follow these instructions at home: Managing pain, stiffness, and swelling     If directed, put ice on the painful area. To do this: Put ice in a plastic bag. Place a towel between your skin and the bag. Leave the ice on for 20 minutes, 2-3 times a day. If directed, apply heat to the affected area as often as told by your health care provider. Use the heat source that your health care provider recommends, such as a moist heat pack or a heating pad. Place a towel between your skin and the heat source. Leave the heat on for 20-30 minutes. Remove the heat if your skin turns bright red. This is especially important if you are unable to feel pain, heat, or cold. You may have a greater risk of getting burned. Activity Rest as told by your health care provider. Avoid activities that make pain worse. This includes any activities that use chest, abdominal, and side muscles. Do not lift anything that is heavier than 10 lb (4.5 kg), or the limit that you are  told, until your health care provider says that it is safe. Return to your normal activities as told by your health care provider. Ask your health care provider what activities are safe for you. General instructions Take over-the-counter and prescription medicines only as told by your health care provider. Keep all follow-up visits as told by your health care provider. This is important. Contact a health care provider if: You have chills or a fever. Your pain does not go away or it  gets worse. You have a cough that does not go away. Get help right away if: You have shortness of breath. You have severe chest pain that is not relieved by medicines, heat, or ice. These symptoms may represent a serious problem that is an emergency. Do not wait to see if the symptoms will go away. Get medical help right away. Call your local emergency services (911 in the [place] Do not drive yourself to the hospital.  Summary Costochondritis is inflammation of the tissue (cartilage) that connects the ribs to the breastbone (sternum). This condition causes pain in the front of the chest. Costochondritis results from stress on the cartilage where your ribs attach to your sternum. Treatment may include medicines, rest, heat or ice, and exercises. This information is not intended to replace advice given to you by your health care provider. Make sure you discuss any questions you have with your health care provider. Document Revised: 01/03/2022 Document Reviewed: 08/29/2019 Elsevier Patient Education  [place].   Please schedule a Follow-up Appointment to: Return if symptoms worsen or fail to improve.  If you have any other questions or concerns, please feel free to call the office or send a message through [facility]. You may also schedule an earlier appointment if necessary.  Additionally, you may be receiving a survey about your experience at our office within a few days to 1 week by e-mail or mail. We value your feedback.  Nobie Putnam, DO [place]

## 2022-12-07 ENCOUNTER — Encounter: Payer: Self-pay | Admitting: Family Medicine

## 2022-12-08 ENCOUNTER — Other Ambulatory Visit: Payer: Self-pay | Admitting: Family Medicine

## 2022-12-08 DIAGNOSIS — M47816 Spondylosis without myelopathy or radiculopathy, lumbar region: Secondary | ICD-10-CM

## 2022-12-08 DIAGNOSIS — G8929 Other chronic pain: Secondary | ICD-10-CM

## 2022-12-08 NOTE — Telephone Encounter (Signed)
Requested medication (s) are due for refill today: yes  Requested medication (s) are on the active medication list: yes  Last refill:  06/16/22  Future visit scheduled: yes  Notes to clinic:  Unable to refill per protocol, cannot delegate.      Requested Prescriptions  Pending Prescriptions Disp Refills   tizanidine (ZANAFLEX) 2 MG capsule [Pharmacy Med Name: tiZANidine HCL 2 MG CAPSULE] 360 capsule 1    Sig: TAKE ONE TO TWO CAPSULES BY MOUTH TWICE A DAY AS NEEDED FOR MUSCLE SPASMS     Not Delegated - Cardiovascular:  Alpha-2 Agonists - tizanidine Failed - 12/08/2022  1:37 PM      Failed - This refill cannot be delegated      Passed - Valid encounter within last 6 months    Recent Outpatient Visits           2 days ago [place], DO   3 months ago Viral URI with cough   [place], DO   4 months ago Annual physical exam   [facility] Olin Hauser, DO   11 months ago GAD (generalized anxiety disorder)   [facility] Olin Hauser, DO   1 year ago Essential hypertension   [place], DO       Future Appointments             In 1 month Parks Ranger, Devonne Doughty, DO [facility], [facility]

## 2023-01-04 ENCOUNTER — Other Ambulatory Visit: Payer: Self-pay | Admitting: *Deleted

## 2023-01-04 DIAGNOSIS — M810 Age-related osteoporosis without current pathological fracture: Secondary | ICD-10-CM

## 2023-01-04 DIAGNOSIS — Z853 Personal history of malignant neoplasm of breast: Secondary | ICD-10-CM

## 2023-01-19 ENCOUNTER — Other Ambulatory Visit: Payer: Self-pay | Admitting: Oncology

## 2023-01-19 ENCOUNTER — Telehealth: Payer: Self-pay | Admitting: Family Medicine

## 2023-01-19 ENCOUNTER — Other Ambulatory Visit: Payer: Self-pay | Admitting: Family Medicine

## 2023-01-19 DIAGNOSIS — J0110 Acute frontal sinusitis, unspecified: Secondary | ICD-10-CM

## 2023-01-19 DIAGNOSIS — J069 Acute upper respiratory infection, unspecified: Secondary | ICD-10-CM

## 2023-01-19 NOTE — Telephone Encounter (Signed)
Contacted [name] to schedule their annual wellness visit. Appointment made for 02/23/2023.  Sherol Dade; Care Guide Ambulatory Clinical [facility] Direct Dial: [phone]

## 2023-01-22 NOTE — Telephone Encounter (Signed)
Requested Prescriptions  Pending Prescriptions Disp Refills   predniSONE (STERAPRED UNI-PAK 21 TAB) 10 MG (21) TBPK tablet [Pharmacy Med Name: predniSONE 10 MG TAB DOSE PACK] 21 tablet     Sig: TAKE AS DIRECTED ON PACKAGE     Not Delegated - Endocrinology:  Oral Corticosteroids Failed - 01/19/2023 11:07 AM      Failed - This refill cannot be delegated      Failed - Manual Review: Eye exam for IOP if prolonged treatment      Failed - Glucose (serum) in normal range and within 180 days    Glucose  Date Value Ref Range Status  02/19/2015 124 (H) mg/dL Final    Comment:    65-99 NOTE: New Reference Range  01/05/15    Glucose, Bld  Date Value Ref Range Status  06/30/2022 98 70 - 99 mg/dL Final    Comment:    Glucose reference range applies only to samples taken after fasting for at least 8 hours.         Failed - K in normal range and within 180 days    Potassium  Date Value Ref Range Status  06/30/2022 4.5 3.5 - 5.1 mmol/L Final  02/19/2015 4.0 mmol/L Final    Comment:    3.5-5.1 NOTE: New Reference Range  01/05/15          Failed - Na in normal range and within 180 days    Sodium  Date Value Ref Range Status  06/30/2022 138 135 - 145 mmol/L Final  02/19/2015 136 mmol/L Final    Comment:    135-145 NOTE: New Reference Range  01/05/15          Passed - Last BP in normal range    BP Readings from Last 1 Encounters:  12/06/22 130/80         Passed - Valid encounter within last 6 months    Recent Outpatient Visits           1 month ago [place], DO   4 months ago Viral URI with cough   [place], DO   6 months ago Annual physical exam   [facility] Olin Hauser, DO   1 year ago GAD (generalized anxiety disorder)   [place], [place], DO   1 year  ago Essential hypertension   [place], DO       Future Appointments             In 1 week Parks Ranger, Devonne Doughty, DO [facility], [place] Density or Dexa Scan completed in the last 2 years       amoxicillin-clavulanate (AUGMENTIN) 875-125 MG tablet [Pharmacy Med Name: AMOX-CLAV 875-125 MG TABLET] 20 tablet 0    Sig: TAKE 1 TABLET BY MOUTH TWICE A DAY     Off-Protocol Failed - 01/19/2023 11:07 AM      Failed - Medication not assigned to a protocol, review manually.      Passed - Valid encounter within last 12 months    Recent Outpatient Visits           1 month ago [facility] [place], [place]  [place], DO   4 months ago Viral URI with cough   [place], DO   6 months ago Annual physical exam   [facility] Olin Hauser, DO   1 year ago GAD (generalized anxiety disorder)   [place], [place], DO   1 year ago Essential hypertension   [place], DO       Future Appointments             In 1 week Parks Ranger, Devonne Doughty, DO [facility], [place]

## 2023-01-29 ENCOUNTER — Ambulatory Visit: Payer: Medicare Other | Admitting: Family Medicine

## 2023-02-20 ENCOUNTER — Other Ambulatory Visit: Payer: Self-pay | Admitting: Oncology

## 2023-02-20 ENCOUNTER — Other Ambulatory Visit: Payer: Self-pay | Admitting: Family Medicine

## 2023-02-20 DIAGNOSIS — J069 Acute upper respiratory infection, unspecified: Secondary | ICD-10-CM

## 2023-02-21 ENCOUNTER — Other Ambulatory Visit: Payer: Self-pay | Admitting: *Deleted

## 2023-02-21 ENCOUNTER — Other Ambulatory Visit: Payer: Self-pay | Admitting: Oncology

## 2023-02-21 MED ORDER — ALENDRONATE SODIUM 70 MG PO TABS: ORAL_TABLET | ORAL | 1 refills | Status: DC

## 2023-02-21 NOTE — Telephone Encounter (Signed)
Unable to refill per protocol, Rx expired. Discontinued 12/06/22, course completed.  Requested Prescriptions  Pending Prescriptions Disp Refills   predniSONE (STERAPRED UNI-PAK 21 TAB) 10 MG (21) TBPK tablet [Pharmacy Med Name: predniSONE 10 MG TAB DOSE PACK] 21 tablet     Sig: TAKE AS DIRECTED ON PACKAGE     Not Delegated - Endocrinology:  Oral Corticosteroids Failed - 02/20/2023  8:08 PM      Failed - This refill cannot be delegated      Failed - Manual Review: Eye exam for IOP if prolonged treatment      Failed - Glucose (serum) in normal range and within 180 days    Glucose  Date Value Ref Range Status  02/19/2015 124 (H) mg/dL Final    Comment:    04-54 NOTE: New Reference Range  01/05/15    Glucose, Bld  Date Value Ref Range Status  06/30/2022 98 70 - 99 mg/dL Final    Comment:    Glucose reference range applies only to samples taken after fasting for at least 8 hours.         Failed - K in normal range and within 180 days    Potassium  Date Value Ref Range Status  06/30/2022 4.5 3.5 - 5.1 mmol/L Final  02/19/2015 4.0 mmol/L Final    Comment:    3.5-5.1 NOTE: New Reference Range  01/05/15          Failed - Na in normal range and within 180 days    Sodium  Date Value Ref Range Status  06/30/2022 138 135 - 145 mmol/L Final  02/19/2015 136 mmol/L Final    Comment:    135-145 NOTE: New Reference Range  01/05/15          Passed - Last BP in normal range    BP Readings from Last 1 Encounters:  12/06/22 130/80         Passed - Valid encounter within last 6 months    Recent Outpatient Visits           2 months ago Costochondritis   [place] [facility] Smitty Cords, DO   5 months ago Viral URI with cough   [place] [facility] Smitty Cords, DO   7 months ago Annual physical exam   [facility] Smitty Cords, DO   1 year ago GAD (generalized anxiety  disorder)   [place] [facility] Smitty Cords, DO   1 year ago Essential hypertension    [facility] [place], Netta Neat, DO              Passed - Bone Mineral Density or Dexa Scan completed in the last 2 years

## 2023-02-23 ENCOUNTER — Ambulatory Visit (INDEPENDENT_AMBULATORY_CARE_PROVIDER_SITE_OTHER): Payer: Medicare Other

## 2023-02-23 VITALS — Ht 64.0 in | Wt 188.0 lb

## 2023-02-23 DIAGNOSIS — Z0000 Encounter for general adult medical examination without abnormal findings: Secondary | ICD-10-CM

## 2023-02-23 NOTE — Patient Instructions (Signed)
Ms. Heidi [name] , Thank you for taking time to come for your Medicare Wellness Visit. I appreciate your ongoing commitment to your health goals. Please review the following plan we discussed and let me know if I can assist you in the future.   These are the goals we discussed:  Goals      DIET - EAT MORE FRUITS AND VEGETABLES     DIET - INCREASE WATER INTAKE        This is a list of the screening recommended for you and due dates:  Health Maintenance  Topic Date Due   DTaP/Tdap/Td vaccine (2 - Td or Tdap) 07/31/2021   COVID-19 Vaccine (5 - 2023-24 season) 06/30/2022   Zoster (Shingles) Vaccine (2 of 2) 09/11/2022   Flu Shot  05/31/2023   Medicare Annual Wellness Visit  02/23/2024   Pneumonia Vaccine  Completed   DEXA scan (bone density measurement)  Completed   HPV Vaccine  Aged Out    Advanced directives: no  Conditions/risks identified: none  Next appointment: Follow up in one year for your annual wellness visit 02/29/24 @ 3:30 pm by phone   Preventive Care 65 Years and Older, Female Preventive care refers to lifestyle choices and visits with your health care provider that can promote health and wellness. What does preventive care include? A yearly physical exam. This is also called an annual well check. Dental exams once or twice a year. Routine eye exams. Ask your health care provider how often you should have your eyes checked. Personal lifestyle choices, including: Daily care of your teeth and gums. Regular physical activity. Eating a healthy diet. Avoiding tobacco and drug use. Limiting alcohol use. Practicing safe sex. Taking low-dose aspirin every day. Taking vitamin and mineral supplements as recommended by your health care provider. What happens during an annual well check? The services and screenings done by your health care provider during your annual well check will depend on your age, overall health, lifestyle risk factors, and family history of  disease. Counseling  Your health care provider may ask you questions about your: Alcohol use. Tobacco use. Drug use. Emotional well-being. Home and relationship well-being. Sexual activity. Eating habits. History of falls. Memory and ability to understand (cognition). Work and work Astronomer. Reproductive health. Screening  You may have the following tests or measurements: Height, weight, and BMI. Blood pressure. Lipid and cholesterol levels. These may be checked every 5 years, or more frequently if you are over 84 years old. Skin check. Lung cancer screening. You may have this screening every year starting at age 85 if you have a 30-pack-year history of smoking and currently smoke or have quit within the past 15 years. Fecal occult blood test (FOBT) of the stool. You may have this test every year starting at age 23. Flexible sigmoidoscopy or colonoscopy. You may have a sigmoidoscopy every 5 years or a colonoscopy every 10 years starting at age 27. Hepatitis C blood test. Hepatitis B blood test. Sexually transmitted disease (STD) testing. Diabetes screening. This is done by checking your blood sugar (glucose) after you have not eaten for a while (fasting). You may have this done every 1-3 years. Bone density scan. This is done to screen for osteoporosis. You may have this done starting at age 72. Mammogram. This may be done every 1-2 years. Talk to your health care provider about how often you should have regular mammograms. Talk with your health care provider about your test results, treatment options, and if necessary, the  need for more tests. Vaccines  Your health care provider may recommend certain vaccines, such as: Influenza vaccine. This is recommended every year. Tetanus, diphtheria, and acellular pertussis (Tdap, Td) vaccine. You may need a Td booster every 10 years. Zoster vaccine. You may need this after age 39. Pneumococcal 13-valent conjugate (PCV13) vaccine. One  dose is recommended after age 85. Pneumococcal polysaccharide (PPSV23) vaccine. One dose is recommended after age 65. Talk to your health care provider about which screenings and vaccines you need and how often you need them. This information is not intended to replace advice given to you by your health care provider. Make sure you discuss any questions you have with your health care provider. Document Released: 11/12/2015 Document Revised: 07/05/2016 Document Reviewed: 08/17/2015 Elsevier Interactive Patient Education  2017 [place] Prevention in the Home Falls can cause injuries. They can happen to people of all ages. There are many things you can do to make your home safe and to help prevent falls. What can I do on the outside of my home? Regularly fix the edges of walkways and driveways and fix any cracks. Remove anything that might make you trip as you walk through a door, such as a raised step or threshold. Trim any bushes or trees on the path to your home. Use bright outdoor lighting. Clear any walking paths of anything that might make someone trip, such as rocks or tools. Regularly check to see if handrails are loose or broken. Make sure that both sides of any steps have handrails. Any raised decks and porches should have guardrails on the edges. Have any leaves, snow, or ice cleared regularly. Use sand or salt on walking paths during winter. Clean up any spills in your garage right away. This includes oil or grease spills. What can I do in the bathroom? Use night lights. Install grab bars by the toilet and in the tub and shower. Do not use towel bars as grab bars. Use non-skid mats or decals in the tub or shower. If you need to sit down in the shower, use a plastic, non-slip stool. Keep the floor dry. Clean up any water that spills on the floor as soon as it happens. Remove soap buildup in the tub or shower regularly. Attach bath mats securely with double-sided  non-slip rug tape. Do not have throw rugs and other things on the floor that can make you trip. What can I do in the bedroom? Use night lights. Make sure that you have a light by your bed that is easy to reach. Do not use any sheets or blankets that are too big for your bed. They should not hang down onto the floor. Have a firm chair that has side arms. You can use this for support while you get dressed. Do not have throw rugs and other things on the floor that can make you trip. What can I do in the kitchen? Clean up any spills right away. Avoid walking on wet floors. Keep items that you use a lot in easy-to-reach places. If you need to reach something above you, use a strong step stool that has a grab bar. Keep electrical cords out of the way. Do not use floor polish or wax that makes floors slippery. If you must use wax, use non-skid floor wax. Do not have throw rugs and other things on the floor that can make you trip. What can I do with my stairs? Do not leave any items on the  stairs. Make sure that there are handrails on both sides of the stairs and use them. Fix handrails that are broken or loose. Make sure that handrails are as long as the stairways. Check any carpeting to make sure that it is firmly attached to the stairs. Fix any carpet that is loose or worn. Avoid having throw rugs at the top or bottom of the stairs. If you do have throw rugs, attach them to the floor with carpet tape. Make sure that you have a light switch at the top of the stairs and the bottom of the stairs. If you do not have them, ask someone to add them for you. What else can I do to help prevent falls? Wear shoes that: Do not have high heels. Have rubber bottoms. Are comfortable and fit you well. Are closed at the toe. Do not wear sandals. If you use a stepladder: Make sure that it is fully opened. Do not climb a closed stepladder. Make sure that both sides of the stepladder are locked into place. Ask  someone to hold it for you, if possible. Clearly mark and make sure that you can see: Any grab bars or handrails. First and last steps. Where the edge of each step is. Use tools that help you move around (mobility aids) if they are needed. These include: Canes. Walkers. Scooters. Crutches. Turn on the lights when you go into a dark area. Replace any light bulbs as soon as they burn out. Set up your furniture so you have a clear path. Avoid moving your furniture around. If any of your floors are uneven, fix them. If there are any pets around you, be aware of where they are. Review your medicines with your doctor. Some medicines can make you feel dizzy. This can increase your chance of falling. Ask your doctor what other things that you can do to help prevent falls. This information is not intended to replace advice given to you by your health care provider. Make sure you discuss any questions you have with your health care provider. Document Released: 08/12/2009 Document Revised: 03/23/2016 Document Reviewed: 11/20/2014 Elsevier Interactive Patient Education  2017 [employer].

## 2023-02-23 NOTE — Progress Notes (Signed)
I connected with  Heidi [name] on 02/23/23 by a audio enabled telemedicine application and verified that I am speaking with the correct person using two identifiers.  Patient Location: Home  Provider Location: Office/Clinic  I discussed the limitations of evaluation and management by telemedicine. The patient expressed understanding and agreed to proceed.  Subjective:   Heidi [name] is a 82 y.o. female who presents for Medicare Annual (Subsequent) preventive examination.  Review of Systems     Cardiac Risk Factors include: advanced age (>83men, >69 women);hypertension;sedentary lifestyle     Objective:    There were no vitals filed for this visit. There is no height or weight on file to calculate BMI.     02/23/2023    3:47 PM 11/20/2022    1:17 PM 06/30/2022    1:11 PM 12/09/2021    2:44 PM 06/30/2021    2:04 PM 01/31/2021    2:56 PM 07/19/2020   10:27 AM  Advanced Directives  Does Patient Have a Medical Advance Directive? No No No No No No No  Would patient like information on creating a medical advance directive? No - Patient declined No - Patient declined Yes (MAU/Ambulatory/Procedural Areas - Information given) No - Patient declined No - Patient declined No - Patient declined No - Patient declined    Current Medications (verified) Outpatient Encounter Medications as of 02/23/2023  Medication Sig   alendronate (FOSAMAX) 70 MG tablet Take with a full glass of water on an empty stomach.   b complex vitamins capsule Take 1 capsule by mouth daily.   benztropine (COGENTIN) 1 MG tablet Take 1 tablet (1 mg total) by mouth 2 (two) times daily as needed for tremors.   busPIRone (BUSPAR) 30 MG tablet Take 2 tablets (60 mg total) by mouth daily.   Calcium Carb-Cholecalciferol (CALCIUM 600 + D PO) Take 1 tablet by mouth 2 (two) times a day.   diclofenac Sodium (VOLTAREN) 1 % GEL APPLY TWO GRAMS TOPICALLY THREE TIMES A DAY AS NEEDED FOR ARTHRITIS PAIN   ipratropium (ATROVENT) 0.06 % nasal  spray Place 1 spray into both nostrils daily as needed for rhinitis.   losartan (COZAAR) 100 MG tablet Take 1 tablet (100 mg total) by mouth daily.   Multiple Vitamin (MULTIVITAMIN WITH MINERALS) TABS tablet Take 1 tablet by mouth at bedtime. Senior Multivitamin   NON FORMULARY Take 2 capsules by mouth daily. Dynamic brain   Red Yeast Rice 600 MG CAPS Take 2 capsules by mouth daily.   sertraline (ZOLOFT) 100 MG tablet Take 1.5 tablets (150 mg total) by mouth daily.   tizanidine (ZANAFLEX) 2 MG capsule TAKE ONE TO TWO CAPSULES BY MOUTH TWICE A DAY AS NEEDED FOR MUSCLE SPASMS   Turmeric 400 MG CAPS Take 1 capsule by mouth 2 (two) times daily.   BucAlfAspKGlucCouchParsUvaUrJu (WATER PILLS PO) Take 1-2 tablets by mouth daily as needed (for fluid retention). (Patient not taking: Reported on 02/23/2023)   traZODone (DESYREL) 100 MG tablet Take 0.5-1 tablets (50-100 mg total) by mouth at bedtime as needed for sleep. Dose adjust according to insomnia symptom (Patient not taking: Reported on 02/23/2023)   No facility-administered encounter medications on file as of 02/23/2023.    Allergies (verified) Other, Diphenhydramine, and Hydrochloric acid   History: Past Medical History:  Diagnosis Date   Anemia    Anxiety    Breast cancer (HCC) 1990   RT MASTECTOMY   C. difficile diarrhea    see dr Mechele Collin and this is her  second course of vancomycin   Cataracts, bilateral    Depression    Dyspnea    with exersion   Hepatitis 1960's   does not know which type   Hyperlipidemia    Hypertension    Insomnia    Neuropathy due to chemotherapeutic drug (HCC)    Osteoarthritis    Osteoporosis    Personal history of radiation therapy 2016   LEFT lumpectomy   Past Surgical History:  Procedure Laterality Date   APPENDECTOMY  1970's   BREAST BIOPSY Left 05/07/2015   Procedure: BREAST BIOPSY WITH NEEDLE LOCALIZATION;  Surgeon: Tiney Rouge III, MD;  Location: [facility];  Service: General;  Laterality: Left;    BREAST LUMPECTOMY Left 2016   BREAST LUMPECTOMY WITH SENTINEL LYMPH NODE BIOPSY Left 05/07/2015   Procedure: PARTIAL MASTECTOMY WITH SENTINEL LYMPH NODE BX;  Surgeon: Tiney Rouge III, MD;  Location: [facility];  Service: General;  Laterality: Left;   BREAST SURGERY Right 1990   mastectomy   EYE SURGERY Bilateral    cataract extractions   MASTECTOMY Right    1990's   PORT-A-CATH REMOVAL Right 03/07/2017   Procedure: REMOVAL PORT-A-CATH;  Surgeon: Ancil Linsey, MD;  Location: [facility];  Service: General;  Laterality: Right;   Family History  Problem Relation Age of Onset   Dementia Mother    Bladder Cancer Father    Diabetes Sister    Breast cancer Neg Hx    Social History   Socioeconomic History   Marital status: Divorced    Spouse name: Not on file   Number of children: Not on file   Years of education: Not on file   Highest education level: Not on file  Occupational History   Not on file  Tobacco Use   Smoking status: Former    Packs/day: 2.00    Years: 25.00    Additional pack years: 0.00    Total pack years: 50.00    Types: Cigarettes    Quit date: 04/28/1992    Years since quitting: 30.8   Smokeless tobacco: Former   Tobacco comments:    Pt quit 1995  Vaping Use   Vaping Use: Never used  Substance and Sexual Activity   Alcohol use: No    Alcohol/week: 0.0 standard drinks of alcohol   Drug use: No   Sexual activity: Never  Other Topics Concern   Not on file  Social History Narrative   Not on file   Social Determinants of Health   Financial Resource Strain: Low Risk  (02/23/2023)   Overall Financial Resource Strain (CARDIA)    Difficulty of Paying Living Expenses: Not hard at all  Food Insecurity: No Food Insecurity (02/23/2023)   Hunger Vital Sign    Worried About Running Out of Food in the Last Year: Never true    Ran Out of Food in the Last Year: Never true  Transportation Needs: No Transportation Needs (02/23/2023)   PRAPARE - Scientist, research (physical sciences) (Medical): No    Lack of Transportation (Non-Medical): No  Physical Activity: Insufficiently Active (02/23/2023)   Exercise Vital Sign    Days of Exercise per Week: 2 days    Minutes of Exercise per Session: 20 min  Stress: No Stress Concern Present (02/23/2023)   [employer] of Occupational Health - Occupational Stress Questionnaire    Feeling of Stress : Not at all  Social Connections: Socially Isolated (02/23/2023)   Social Connection and Isolation Panel [NHANES]  Frequency of Communication with Friends and Family: Once a week    Frequency of Social Gatherings with Friends and Family: Never    Attends Religious Services: Never    Diplomatic Services operational officer: No    Attends Engineer, structural: Never    Marital Status: Divorced    Tobacco Counseling Counseling given: Not Answered Tobacco comments: Pt quit 1995   Clinical Intake:  Pre-visit preparation completed: Yes  Pain : No/denies pain     Nutritional Risks: None Diabetes: No  How often do you need to have someone help you when you read instructions, pamphlets, or other written materials from your doctor or pharmacy?: 1 - Never  Diabetic?no  Interpreter Needed?: No  Information entered by :: Kennedy Bucker, LPN   Activities of Daily Living    02/23/2023    3:48 PM  In your present state of health, do you have any difficulty performing the following activities:  Hearing? 0  Vision? 0  Difficulty concentrating or making decisions? 0  Walking or climbing stairs? 0  Dressing or bathing? 0  Doing errands, shopping? 0  Preparing Food and eating ? N  Using the Toilet? N  In the past six months, have you accidently leaked urine? N  Do you have problems with loss of bowel control? N  Managing your Medications? N  Managing your Finances? N  Housekeeping or managing your Housekeeping? N    Patient Care Team: Smitty Cords, DO as PCP - General (Family  Medicine)  Indicate any recent Medical Services you may have received from other than [facility] providers in the past year (date may be approximate).     Assessment:   This is a routine wellness examination for Heidi [name].  Hearing/Vision screen Hearing Screening - Comments:: No aids Vision Screening - Comments:: No glasses- MD [place]  Dietary issues and exercise activities discussed: Current Exercise Habits: Home exercise routine, Type of exercise: walking, Time (Minutes): 20, Frequency (Times/Week): 2, Weekly Exercise (Minutes/Week): 40, Intensity: Mild   Goals Addressed             This Visit's Progress    DIET - INCREASE WATER INTAKE         Depression Screen    02/23/2023    3:45 PM 07/26/2022    2:41 PM 01/09/2022    3:45 PM 12/09/2021    2:42 PM 07/06/2021    2:23 PM 03/30/2021    2:31 PM 12/23/2020    7:38 AM  PHQ 2/9 Scores  PHQ - 2 Score 0 2 2 0 0 0 1  PHQ- 9 Score 0 2 2  0 2 5    Fall Risk    02/23/2023    3:48 PM 07/26/2022    2:41 PM 01/09/2022    3:44 PM 12/09/2021    2:44 PM 03/30/2021    2:31 PM  Fall Risk   Falls in the past year? 0 0 0 0 0  Number falls in past yr: 0 0 0 0 0  Injury with Fall? 0 0 0 0 0  Risk for fall due to : No Fall Risks Impaired balance/gait;Impaired mobility No Fall Risks No Fall Risks   Follow up Falls prevention discussed;Falls evaluation completed Falls evaluation completed Falls evaluation completed Falls evaluation completed Falls evaluation completed    FALL RISK PREVENTION PERTAINING TO THE HOME:  Any stairs in or around the home? No  If so, are there any without handrails? No  Home free  of loose throw rugs in walkways, pet beds, electrical cords, etc? Yes  Adequate lighting in your home to reduce risk of falls? Yes   ASSISTIVE DEVICES UTILIZED TO PREVENT FALLS:  Life alert? No  Use of a cane, walker or w/c? Yes - cane Grab bars in the bathroom? Yes  Shower chair or bench in shower? Yes  Elevated toilet seat or a handicapped  toilet? No   Cognitive Function:        02/23/2023    3:52 PM  6CIT Screen  What Year? 0 points  What month? 3 points  What time? 0 points  Count back from 20 0 points  Months in reverse 0 points  Repeat phrase 0 points  Total Score 3 points    Immunizations Immunization History  Administered Date(s) Administered   Fluad Quad(high Dose 65+) 09/21/2020   Influenza Inj Mdck Quad Pf 07/26/2018   Influenza, High Dose Seasonal PF 08/23/2017, 09/23/2019, 07/17/2022   Influenza,inj,Quad PF,6+ Mos 07/23/2014, 08/04/2015   Influenza-Unspecified 07/14/2010, 07/30/2012, 07/23/2014, 08/22/2014, 08/04/2015, 07/30/2016, 08/31/2019   PFIZER Comirnaty(Gray Top)Covid-19 Tri-Sucrose Vaccine 03/11/2021   PFIZER(Purple Top)SARS-COV-2 Vaccination 11/20/2019, 12/11/2019, 06/22/2020   Pneumococcal Conjugate-13 02/07/2018   Pneumococcal Polysaccharide-23 08/01/2011, 04/21/2019   Tdap 08/01/2011   Zoster Recombinat (Shingrix) 07/17/2022   Zoster, Live 08/03/2010    TDAP status: Due, Education has been provided regarding the importance of this vaccine. Advised may receive this vaccine at local pharmacy or Health Dept. Aware to provide a copy of the vaccination record if obtained from local pharmacy or Health Dept. Verbalized acceptance and understanding.  Flu Vaccine status: Up to date  Pneumococcal vaccine status: Up to date  Covid-19 vaccine status: Completed vaccines  Qualifies for Shingles Vaccine? Yes   Zostavax completed Yes   Shingrix Completed?: No.    Education has been provided regarding the importance of this vaccine. Patient has been advised to call insurance company to determine out of pocket expense if they have not yet received this vaccine. Advised may also receive vaccine at local pharmacy or Health Dept. Verbalized acceptance and understanding.  Screening Tests Health Maintenance  Topic Date Due   DTaP/Tdap/Td (2 - Td or Tdap) 07/31/2021   COVID-19 Vaccine (5 - 2023-24  season) 06/30/2022   Zoster Vaccines- Shingrix (2 of 2) 09/11/2022   INFLUENZA VACCINE  05/31/2023   Medicare Annual Wellness (AWV)  02/23/2024   Pneumonia Vaccine 73+ Years old  Completed   DEXA SCAN  Completed   HPV VACCINES  Aged Out    Health Maintenance  Health Maintenance Due  Topic Date Due   DTaP/Tdap/Td (2 - Td or Tdap) 07/31/2021   COVID-19 Vaccine (5 - 2023-24 season) 06/30/2022   Zoster Vaccines- Shingrix (2 of 2) 09/11/2022    Colorectal cancer screening: No longer required.   Mammogram status: No longer required due to age.  Bone Density status: Completed 06/28/21. Results reflect: Bone density results: OSTEOPOROSIS. Repeat every 2 years.- ordered 01/04/23  Lung Cancer Screening: (Low Dose CT Chest recommended if Age 2-80 years, 30 pack-year currently smoking OR have quit w/in 15years.) does not qualify.   Additional Screening:  Hepatitis C Screening: does not qualify; Completed no  Vision Screening: Recommended annual ophthalmology exams for early detection of glaucoma and other disorders of the eye. Is the patient up to date with their annual eye exam?  Yes  Who is the provider or what is the name of the office in which the patient attends annual eye exams? MD in [place]  If pt is not established with a provider, would they like to be referred to a provider to establish care? No .   Dental Screening: Recommended annual dental exams for proper oral hygiene  Community Resource Referral / Chronic Care Management: CRR required this visit?  No   CCM required this visit?  No      Plan:     I have personally reviewed and noted the following in the patient's chart:   Medical and social history Use of alcohol, tobacco or illicit drugs  Current medications and supplements including opioid prescriptions. Patient is not currently taking opioid prescriptions. Functional ability and status Nutritional status Physical activity Advanced directives List of other  physicians Hospitalizations, surgeries, and ER visits in previous 12 months Vitals Screenings to include cognitive, depression, and falls Referrals and appointments  In addition, I have reviewed and discussed with patient certain preventive protocols, quality metrics, and best practice recommendations. A written personalized care plan for preventive services as well as general preventive health recommendations were provided to patient.     Hal Hope, LPN   02/05/8118   Nurse Notes: none

## 2023-06-09 ENCOUNTER — Other Ambulatory Visit: Payer: Self-pay | Admitting: *Deleted

## 2023-06-11 ENCOUNTER — Other Ambulatory Visit: Payer: Self-pay | Admitting: Oncology

## 2023-06-11 DIAGNOSIS — Z1231 Encounter for screening mammogram for malignant neoplasm of breast: Secondary | ICD-10-CM

## 2023-07-03 ENCOUNTER — Ambulatory Visit
Admission: RE | Admit: 2023-07-03 | Discharge: 2023-07-03 | Disposition: A | Discharge status: Home / Self Care | Payer: Medicare Other | Source: Ambulatory Visit | Attending: Oncology | Admitting: Oncology

## 2023-07-03 DIAGNOSIS — Z853 Personal history of malignant neoplasm of breast: Secondary | ICD-10-CM | POA: Insufficient documentation

## 2023-07-03 DIAGNOSIS — M810 Age-related osteoporosis without current pathological fracture: Secondary | ICD-10-CM | POA: Insufficient documentation

## 2023-07-06 ENCOUNTER — Encounter: Payer: Self-pay | Admitting: Oncology

## 2023-07-06 ENCOUNTER — Ambulatory Visit
Admission: RE | Admit: 2023-07-06 | Discharge: 2023-07-06 | Disposition: A | Discharge status: Home / Self Care | Payer: Medicare Other | Source: Ambulatory Visit | Attending: Oncology | Admitting: Oncology

## 2023-07-06 ENCOUNTER — Inpatient Hospital Stay: Payer: Medicare Other | Attending: Oncology | Admitting: Oncology

## 2023-07-06 VITALS — BP 148/87 | HR 85 | Temp 96.4°F | Resp 18 | Ht 64.0 in | Wt 190.1 lb

## 2023-07-06 DIAGNOSIS — Z9221 Personal history of antineoplastic chemotherapy: Secondary | ICD-10-CM | POA: Insufficient documentation

## 2023-07-06 DIAGNOSIS — Z8052 Family history of malignant neoplasm of bladder: Secondary | ICD-10-CM | POA: Diagnosis not present

## 2023-07-06 DIAGNOSIS — Z87891 Personal history of nicotine dependence: Secondary | ICD-10-CM | POA: Insufficient documentation

## 2023-07-06 DIAGNOSIS — Z08 Encounter for follow-up examination after completed treatment for malignant neoplasm: Secondary | ICD-10-CM | POA: Diagnosis not present

## 2023-07-06 DIAGNOSIS — Z923 Personal history of irradiation: Secondary | ICD-10-CM | POA: Diagnosis not present

## 2023-07-06 DIAGNOSIS — Z853 Personal history of malignant neoplasm of breast: Secondary | ICD-10-CM | POA: Diagnosis not present

## 2023-07-06 DIAGNOSIS — M549 Dorsalgia, unspecified: Secondary | ICD-10-CM | POA: Insufficient documentation

## 2023-07-06 DIAGNOSIS — G8929 Other chronic pain: Secondary | ICD-10-CM | POA: Insufficient documentation

## 2023-07-06 DIAGNOSIS — Z86711 Personal history of pulmonary embolism: Secondary | ICD-10-CM | POA: Insufficient documentation

## 2023-07-06 DIAGNOSIS — Z1231 Encounter for screening mammogram for malignant neoplasm of breast: Secondary | ICD-10-CM | POA: Insufficient documentation

## 2023-07-06 DIAGNOSIS — M810 Age-related osteoporosis without current pathological fracture: Secondary | ICD-10-CM | POA: Diagnosis not present

## 2023-07-06 DIAGNOSIS — Z9011 Acquired absence of right breast and nipple: Secondary | ICD-10-CM | POA: Insufficient documentation

## 2023-07-06 NOTE — Progress Notes (Signed)
Hematology/Oncology Consult note [facility]  [phone] [phone]  Patient Care Team: Smitty Cords, DO as PCP - General (Family Medicine) Creig Hines, MD as Consulting Physician (Oncology)   Name of the patient: Heidi [name]  [id number]  November 09, 1940   Date of visit: 07/06/23  Diagnosis-  stage II left breast cancer ER/PR positive and HER-2/neu negative   Chief complaint/ Reason for visit-routine follow-up of breast cancer and osteoporosis  Heme/Onc history:  Oncology History Overview Note  # March 2016- LEFT BREAST CANCER STAGE II ER- 90%; PR-90%; Her 2 neu- NEG; NEO-ADJ CHEMO-  AC x4- Taxol x12 w;;s/p LUMPEC & SLNBx [ypT1 [1.5cm]; ypsN 3/6 (2 macro & 1 micro met)]  s/p RT [Nov 1st week;finish] NOV 2016- START FEMARA stop Feb 2017; April 2017- start Arimidex  # Right Breast cancer ? Stage;  s/p Mastec [1990;s/p Chemo; RT ]  # PN G-2 on neurontin; Hx PE on eliquis  # Mediport   Cancer of overlapping sites of left female breast (HCC) (Resolved)  06/09/2016 Initial Diagnosis   Cancer of overlapping sites of left female breast [facility])     Patient completed 5 years of Arimidex in November 2021.  She is on weekly Fosamax for osteoporosis   Interval history-she had a fall this morning and hurt her right knee.  No obvious injuries.  No head injury.  She is otherwise doing well and denies other complaints at this time.  She has some mild chronic back pain which is overall stable  ECOG PS- 2 Pain scale- 3   Review of systems- Review of Systems  Constitutional:  Positive for malaise/fatigue. Negative for chills, fever and weight loss.  HENT:  Negative for congestion, ear discharge and nosebleeds.   Eyes:  Negative for blurred vision.  Respiratory:  Negative for cough, hemoptysis, sputum production, shortness of breath and wheezing.   Cardiovascular:  Negative for chest pain, palpitations, orthopnea and claudication.   Gastrointestinal:  Negative for abdominal pain, blood in stool, constipation, diarrhea, heartburn, melena, nausea and vomiting.  Genitourinary:  Negative for dysuria, flank pain, frequency, hematuria and urgency.  Musculoskeletal:  Positive for back pain. Negative for joint pain and myalgias.  Skin:  Negative for rash.  Neurological:  Negative for dizziness, tingling, focal weakness, seizures, weakness and headaches.  Endo/Heme/Allergies:  Does not bruise/bleed easily.  Psychiatric/Behavioral:  Negative for depression and suicidal ideas. The patient does not have insomnia.       Allergies  Allergen Reactions   Other Anaphylaxis    Uncoded Allergy. Allergen: HYDROCHLORLIC ACID- causes water blisters   Diphenhydramine Other (See Comments)    pt states makes legs very restless and ache   Hydrochloric Acid Hives    Other reaction(s): Blisters Uncoded Allergy. Allergen: HYDROCHLORLIC ACID.     Past Medical History:  Diagnosis Date   Anemia    Anxiety    Breast cancer (HCC) 1990   RT MASTECTOMY   C. difficile diarrhea    see dr Mechele Collin and this is her second course of vancomycin   Cataracts, bilateral    Depression    Dyspnea    with exersion   Hepatitis 1960's   does not know which type   Hyperlipidemia    Hypertension    Insomnia    Neuropathy due to chemotherapeutic drug (HCC)    Osteoarthritis    Osteoporosis    Personal history of radiation therapy 2016   LEFT lumpectomy     Past  Surgical History:  Procedure Laterality Date   APPENDECTOMY  1970's   BREAST BIOPSY Left 05/07/2015   Procedure: BREAST BIOPSY WITH NEEDLE LOCALIZATION;  Surgeon: Tiney Rouge III, MD;  Location: [facility] ORS;  Service: General;  Laterality: Left;   BREAST LUMPECTOMY Left 2016   BREAST LUMPECTOMY WITH SENTINEL LYMPH NODE BIOPSY Left 05/07/2015   Procedure: PARTIAL MASTECTOMY WITH SENTINEL LYMPH NODE BX;  Surgeon: Tiney Rouge III, MD;  Location: [facility] ORS;  Service: General;  Laterality: Left;    BREAST SURGERY Right 1990   mastectomy   EYE SURGERY Bilateral    cataract extractions   MASTECTOMY Right    1990's   PORT-A-CATH REMOVAL Right 03/07/2017   Procedure: REMOVAL PORT-A-CATH;  Surgeon: Ancil Linsey, MD;  Location: [facility] ORS;  Service: General;  Laterality: Right;    Social History   Socioeconomic History   Marital status: Divorced    Spouse name: Not on file   Number of children: Not on file   Years of education: Not on file   Highest education level: Not on file  Occupational History   Not on file  Tobacco Use   Smoking status: Former    Current packs/day: 0.00    Average packs/day: 2.0 packs/day for 25.0 years (50.0 ttl pk-yrs)    Types: Cigarettes    Start date: 04/29/1967    Quit date: 04/28/1992    Years since quitting: 31.2   Smokeless tobacco: Former   Tobacco comments:    Pt quit 1995  Vaping Use   Vaping status: Never Used  Substance and Sexual Activity   Alcohol use: No    Alcohol/week: 0.0 standard drinks of alcohol   Drug use: No   Sexual activity: Never  Other Topics Concern   Not on file  Social History Narrative   Not on file   Social Determinants of Health   Financial Resource Strain: Low Risk  (02/23/2023)   Overall Financial Resource Strain (CARDIA)    Difficulty of Paying Living Expenses: Not hard at all  Food Insecurity: No Food Insecurity (02/23/2023)   Hunger Vital Sign    Worried About Running Out of Food in the Last Year: Never true    Ran Out of Food in the Last Year: Never true  Transportation Needs: No Transportation Needs (02/23/2023)   PRAPARE - Administrator, Civil Service (Medical): No    Lack of Transportation (Non-Medical): No  Physical Activity: Insufficiently Active (02/23/2023)   Exercise Vital Sign    Days of Exercise per Week: 2 days    Minutes of Exercise per Session: 20 min  Stress: No Stress Concern Present (02/23/2023)   [employer] of Occupational Health - Occupational Stress  Questionnaire    Feeling of Stress : Not at all  Social Connections: Socially Isolated (02/23/2023)   Social Connection and Isolation Panel [NHANES]    Frequency of Communication with Friends and Family: Once a week    Frequency of Social Gatherings with Friends and Family: Never    Attends Religious Services: Never    Database administrator or Organizations: No    Attends Banker Meetings: Never    Marital Status: Divorced  Catering manager Violence: Not At Risk (02/23/2023)   Humiliation, Afraid, Rape, and Kick questionnaire    Fear of Current or Ex-Partner: No    Emotionally Abused: No    Physically Abused: No    Sexually Abused: No    Family History  Problem Relation Age  of Onset   Dementia Mother    Bladder Cancer Father    Diabetes Sister    Breast cancer Neg Hx      Current Outpatient Medications:    alendronate (FOSAMAX) 70 MG tablet, Take with a full glass of water on an empty stomach., Disp: 12 tablet, Rfl: 1   b complex vitamins capsule, Take 1 capsule by mouth daily., Disp: , Rfl:    benztropine (COGENTIN) 1 MG tablet, Take 1 tablet (1 mg total) by mouth 2 (two) times daily as needed for tremors., Disp: 60 tablet, Rfl: 5   busPIRone (BUSPAR) 30 MG tablet, Take 2 tablets (60 mg total) by mouth daily., Disp: 180 tablet, Rfl: 3   Calcium Carb-Cholecalciferol (CALCIUM 600 + D PO), Take 1 tablet by mouth 2 (two) times a day., Disp: , Rfl:    diclofenac Sodium (VOLTAREN) 1 % GEL, APPLY TWO GRAMS TOPICALLY THREE TIMES A DAY AS NEEDED FOR ARTHRITIS PAIN, Disp: 100 g, Rfl: 3   ipratropium (ATROVENT) 0.06 % nasal spray, Place 1 spray into both nostrils daily as needed for rhinitis., Disp: 15 mL, Rfl: 3   losartan (COZAAR) 100 MG tablet, Take 1 tablet (100 mg total) by mouth daily., Disp: 90 tablet, Rfl: 3   Multiple Vitamin (MULTIVITAMIN WITH MINERALS) TABS tablet, Take 1 tablet by mouth at bedtime. Senior Multivitamin, Disp: , Rfl:    NON FORMULARY, Take 2 capsules  by mouth daily. Dynamic brain, Disp: , Rfl:    Red Yeast Rice 600 MG CAPS, Take 2 capsules by mouth daily., Disp: , Rfl:    sertraline (ZOLOFT) 100 MG tablet, Take 1.5 tablets (150 mg total) by mouth daily., Disp: 135 tablet, Rfl: 3   tizanidine (ZANAFLEX) 2 MG capsule, TAKE ONE TO TWO CAPSULES BY MOUTH TWICE A DAY AS NEEDED FOR MUSCLE SPASMS, Disp: 360 capsule, Rfl: 1   traZODone (DESYREL) 100 MG tablet, Take 50-100 mg by mouth at bedtime as needed for sleep. Dose adjust according to insomnia symptom, Disp: 90 tablet, Rfl: 1   Turmeric 400 MG CAPS, Take 1 capsule by mouth 2 (two) times daily., Disp: , Rfl:    BucAlfAspKGlucCouchParsUvaUrJu (WATER PILLS PO), Take 1-2 tablets by mouth daily as needed (for fluid retention). (Patient not taking: Reported on 02/23/2023), Disp: , Rfl:   Physical exam:  Vitals:   07/06/23 1349 07/06/23 1353  BP: (!) 146/89 (!) 148/87  Pulse: 62 85  Resp: 18   Temp: (!) 96.4 F (35.8 C)   TempSrc: Tympanic   SpO2: 97%   Weight: 190 lb 1.6 oz (86.2 kg)   Height: 5\' 4"  (1.626 m)    Physical Exam Cardiovascular:     Rate and Rhythm: Normal rate and regular rhythm.     Heart sounds: Normal heart sounds.  Pulmonary:     Effort: Pulmonary effort is normal.     Breath sounds: Normal breath sounds.  Skin:    General: Skin is warm and dry.  Neurological:     Mental Status: She is alert and oriented to person, place, and time.   Breast exam: Patient is s/p right mastectomy without reconstruction.  No evidence of chest wall recurrence.  No palpable bilateral axillary adenopathy.  She is s/p left lumpectomy.  No palpable left breast masses     Latest Ref Rng & Units 06/30/2022   12:40 PM  CMP  Glucose 70 - 99 mg/dL 98   BUN 8 - 23 mg/dL 17   Creatinine 2.95 - 1.00  mg/dL 1.61   Sodium 096 - 045 mmol/L 138   Potassium 3.5 - 5.1 mmol/L 4.5   Chloride 98 - 111 mmol/L 103   CO2 22 - 32 mmol/L 26   Calcium 8.9 - 10.3 mg/dL 9.8   Total Protein 6.5 - 8.1 g/dL 7.2    Total Bilirubin 0.3 - 1.2 mg/dL 0.5   Alkaline Phos 38 - 126 U/L 65   AST 15 - 41 U/L 26   ALT 0 - 44 U/L 24       Latest Ref Rng & Units 06/30/2022   12:40 PM  CBC  WBC 4.0 - 10.5 K/uL 6.1   Hemoglobin 12.0 - 15.0 g/dL 40.9   Hematocrit 81.1 - 46.0 % 38.1   Platelets 150 - 400 K/uL 241     No images are attached to the encounter.  DG Bone Density  Result Date: 07/03/2023 EXAM: DUAL X-RAY ABSORPTIOMETRY (DXA) FOR BONE MINERAL DENSITY IMPRESSION: Your patient Aamia Uptegrove completed a BMD test on 07/03/2023 using the [employer] iDXA DXA System (software version: 14.10) manufactured by [employer]. The following summarizes the results of our evaluation. Technologist: [facility] PATIENT BIOGRAPHICAL: Name: Nkiruka, Samaroo Patient ID: [id number] Birth Date: June 02, 1941 Height: 64.0 in. Gender: Female Exam Date: 07/03/2023 Weight: 187.6 lbs. Indications: Advanced Age, Caucasian, COPD, Height Loss, History of Breast Cancer, History of Chemo, History of Radiation, Postmenopausal Fractures: Treatments: Calcium, Fosamax, Vitamin D DENSITOMETRY RESULTS: Site         Region      Measured Date Measured Age WHO Classification Young Adult T-score BMD         %Change vs. Previous Significant Change (*) DualFemur Total Right 07[age]03/2023 81.2 Osteoporosis -2.8 0.653 g/cm2 -7.5% Yes DualFemur Total Right 06/28/2021 79.2 Osteopenia -2.4 0.706 g/cm2 1.6% - DualFemur Total Right 06/24/2019 77.2 Osteoporosis -2.5 0.695 g/cm2 11.4% Yes DualFemur Total Right 03/01/2017 74.9 Osteoporosis -3.0 0.624 g/cm2 - - DualFemur Total Mean 07/03/2023 81.2 Osteoporosis -2.5 0.697 g/cm2 -2.2% - DualFemur Total Mean 06/28/2021 79.2 Osteopenia -2.3 0.713 g/cm2 0.1% - DualFemur Total Mean 06/24/2019 77.2 Osteopenia -2.4 0.712 g/cm2 10.9% Yes DualFemur Total Mean 03/01/2017 74.9 Osteoporosis -2.9 0.642 g/cm2 - - Left Forearm Radius 33% 07/03/2023 81.2 Osteopenia -1.2 0.772 g/cm2 5.3% Yes Left Forearm Radius 33% 06/28/2021 79.2 Osteopenia -1.6  0.733 g/cm2 -2.0% - Left Forearm Radius 33% 06/24/2019 77.2 Osteopenia -1.5 0.748 g/cm2 -8.1% Yes Left Forearm Radius 33% 03/01/2017 74.9 Normal -0.7 0.814 g/cm2 - - ASSESSMENT: The BMD measured at Femur Total Right is 0.653 g/cm2 with a T-score of -2.8. This patient is considered osteoporotic according to World Health Organization [facility]) criteria. The scan quality is good. Lumbar spine was not utilized due to advanced degenerative changes. Compared with prior study, there has been no significant change in the total hip. World Science writer [facility]) criteria for post-menopausal, Caucasian Women: Normal:                   T-score at or above -1 SD Osteopenia/low bone mass: T-score between -1 and -2.5 SD Osteoporosis:             T-score at or below -2.5 SD RECOMMENDATIONS: 1. All patients should optimize calcium and vitamin D intake. 2. Consider FDA-approved medical therapies in postmenopausal women and men aged 33 years and older, based on the following: a. A hip or vertebral(clinical or morphometric) fracture b. T-score < -2.5 at the femoral neck or spine after appropriate evaluation to exclude secondary causes c. Low  bone mass (T-score between -1.0 and -2.5 at the femoral neck or spine) and a 10-year probability of a hip fracture > 3% or a 10-year probability of a major osteoporosis-related fracture > 20% based on the [place]-adapted WHO algorithm 3. Clinician judgment and/or patient preferences may indicate treatment for people with 10-year fracture probabilities above or below these levels FOLLOW-UP: People with diagnosed cases of osteoporosis or at high risk for fracture should have regular bone mineral density tests. For patients eligible for Medicare, routine testing is allowed once every 2 years. The testing frequency can be increased to one year for patients who have rapidly progressing disease, those who are receiving or discontinuing medical therapy to restore bone mass, or have additional risk factors. I  have reviewed this report, and agree with the above findings. [facility] Electronically Signed   By: Romona Curls M.D.   On: 07/03/2023 10:05     Assessment and plan- Patient is a 82 y.o. female with history of left breast cancer stage I status post lumpectomy, adjuvant radiation therapy and 5 years of Arimidex as well as right mastectomy.  This is a routine follow-up visit  Patient had a mammogram today the results of which are pending.  Clinically she is doing well with no concerning signs and symptoms of recurrence based on today's exam.  I will see her back in 6 months and following that I will see her on a yearly basis.  She has osteoporosis and is on Fosamax which is tolerating it well without any significant side effects.  She has mild chronic back pain which is overall stable   Visit Diagnosis 1. Osteoporosis without current pathological fracture, unspecified osteoporosis type   2. Encounter for follow-up surveillance of breast cancer      Dr. Owens Shark, MD, MPH [facility] [phone] 07/06/2023 2:45 PM

## 2023-07-23 ENCOUNTER — Other Ambulatory Visit: Payer: Self-pay | Admitting: Family Medicine

## 2023-07-23 DIAGNOSIS — M545 Other chronic pain: Secondary | ICD-10-CM

## 2023-07-23 DIAGNOSIS — M47816 Spondylosis without myelopathy or radiculopathy, lumbar region: Secondary | ICD-10-CM

## 2023-07-24 NOTE — Telephone Encounter (Signed)
Requested medication (s) are due for refill today: Yes  Requested medication (s) are on the active medication list: Yes  Last refill:  12/08/22  Future visit scheduled: Yes  Notes to clinic:  Unable to refill per protocol, cannot delegate.      Requested Prescriptions  Pending Prescriptions Disp Refills   tizanidine (ZANAFLEX) 2 MG capsule [Pharmacy Med Name: tiZANidine HCL 2 MG CAPSULE] 360 capsule 1    Sig: TAKE 1 TO 2 CAPSULES BY MOUTH TWO TIMES A DAY AS NEEDED FOR MUSCLE SPASMS     Not Delegated - Cardiovascular:  Alpha-2 Agonists - tizanidine Failed - 07/23/2023 12:37 AM      Failed - This refill cannot be delegated      Passed - Valid encounter within last 6 months    Recent Outpatient Visits           7 months ago Costochondritis   [place] [facility] Smitty Cords, DO   10 months ago Viral URI with cough   [place] [facility] Smitty Cords, DO   12 months ago Annual physical exam   [place] [facility] Smitty Cords, DO   1 year ago GAD (generalized anxiety disorder)   [place] [facility] Smitty Cords, DO   2 years ago Essential hypertension   [place] [facility] [place], Netta Neat, [place]

## 2023-08-03 DIAGNOSIS — Z961 Presence of intraocular lens: Secondary | ICD-10-CM | POA: Diagnosis not present

## 2023-08-27 ENCOUNTER — Other Ambulatory Visit: Payer: Self-pay | Admitting: Family Medicine

## 2023-08-27 DIAGNOSIS — F330 Major depressive disorder, recurrent, mild: Secondary | ICD-10-CM

## 2023-08-27 DIAGNOSIS — F411 Generalized anxiety disorder: Secondary | ICD-10-CM

## 2023-08-28 NOTE — Telephone Encounter (Signed)
Requested Prescriptions  Pending Prescriptions Disp Refills   sertraline (ZOLOFT) 100 MG tablet [Pharmacy Med Name: SERTRALINE HCL 100 MG TABLET] 135 tablet 0    Sig: TAKE 1 AND 1/2 TABLET BY MOUTH DAILY     Psychiatry:  Antidepressants - SSRI - sertraline Failed - 08/27/2023  6:21 AM      Failed - AST in normal range and within 360 days    AST  Date Value Ref Range Status  06/30/2022 26 15 - 41 U/L Final   SGOT(AST)  Date Value Ref Range Status  02/19/2015 21 U/L Final    Comment:    15-41 NOTE: New Reference Range  01/05/15          Failed - ALT in normal range and within 360 days    ALT  Date Value Ref Range Status  06/30/2022 24 0 - 44 U/L Final   SGPT (ALT)  Date Value Ref Range Status  02/19/2015 19 U/L Final    Comment:    14-54 NOTE: New Reference Range  01/05/15          Failed - Valid encounter within last 6 months    Recent Outpatient Visits           8 months ago Costochondritis   [place] [facility] Smitty Cords, DO   12 months ago Viral URI with cough   [place] [facility] Smitty Cords, DO   1 year ago Annual physical exam   [place] [facility] Smitty Cords, DO   1 year ago GAD (generalized anxiety disorder)   [place] [facility] Smitty Cords, DO   2 years ago Essential hypertension   [place] [facility] [place], Netta Neat, DO              Passed - Completed PHQ-2 or PHQ-9 in the last 360 days

## 2023-09-26 ENCOUNTER — Other Ambulatory Visit: Payer: Self-pay | Admitting: Family Medicine

## 2023-09-26 DIAGNOSIS — I10 Essential (primary) hypertension: Secondary | ICD-10-CM

## 2023-09-26 NOTE — Telephone Encounter (Signed)
Requested medication (s) are due for refill today: Yes  Requested medication (s) are on the active medication list: Yes  Last refill:  07/26/22 #90, 3RF  Future visit scheduled: No  Notes to clinic:  Unable to refill per protocol, appointment needed. Patient notified via MyChart appt needed     Requested Prescriptions  Pending Prescriptions Disp Refills   losartan (COZAAR) 100 MG tablet [Pharmacy Med Name: LOSARTAN POTASSIUM 100 MG TAB] 90 tablet 3    Sig: TAKE 1 TABLET BY MOUTH DAILY     Cardiovascular:  Angiotensin Receptor Blockers Failed - 09/26/2023  2:49 PM      Failed - Cr in normal range and within 180 days    Creatinine  Date Value Ref Range Status  02/19/2015 0.46 mg/dL Final    Comment:    7.10-6.26 NOTE: New Reference Range  01/05/15    Creatinine, Ser  Date Value Ref Range Status  06/30/2022 0.70 0.44 - 1.00 mg/dL Final   Creatine, Serum  Date Value Ref Range Status  02/24/2015 0.90  Final         Failed - K in normal range and within 180 days    Potassium  Date Value Ref Range Status  06/30/2022 4.5 3.5 - 5.1 mmol/L Final  02/19/2015 4.0 mmol/L Final    Comment:    3.5-5.1 NOTE: New Reference Range  01/05/15          Failed - Last BP in normal range    BP Readings from Last 1 Encounters:  07/06/23 (!) 148/87         Failed - Valid encounter within last 6 months    Recent Outpatient Visits           9 months ago Costochondritis   [place] [facility] Smitty Cords, DO   1 year ago Viral URI with cough   [place] [facility] Smitty Cords, DO   1 year ago Annual physical exam   [place] [facility] Smitty Cords, DO   1 year ago GAD (generalized anxiety disorder)   [place] [facility] Smitty Cords, DO   2 years ago Essential hypertension   [place] [facility] Smitty Cords, [place] - Patient is not pregnant

## 2023-12-17 ENCOUNTER — Other Ambulatory Visit: Payer: Self-pay | Admitting: Oncology

## 2023-12-21 ENCOUNTER — Other Ambulatory Visit: Payer: Self-pay | Admitting: Family Medicine

## 2023-12-21 DIAGNOSIS — F411 Generalized anxiety disorder: Secondary | ICD-10-CM

## 2023-12-21 DIAGNOSIS — F330 Major depressive disorder, recurrent, mild: Secondary | ICD-10-CM

## 2023-12-21 NOTE — Telephone Encounter (Signed)
 Requested medications are due for refill today.  yes  Requested medications are on the active medications list.  yes  Last refill. Varies  Future visit scheduled.   no  Notes to clinic.  Pt last CPE was 07/26/2022. Labs are expired. Pt is more than 3 months overdue for an ov.    Requested Prescriptions  Pending Prescriptions Disp Refills   busPIRone (BUSPAR) 30 MG tablet [Pharmacy Med Name: busPIRone HCL 30 MG TABLET] 180 tablet 3    Sig: TAKE 2 TABLETS BY MOUTH DAILY     Psychiatry: Anxiolytics/Hypnotics - Non-controlled Passed - 12/21/2023  3:59 PM      Passed - Valid encounter within last 12 months    Recent Outpatient Visits           1 year ago Costochondritis   [place] [facility] [place], Netta Neat, DO   1 year ago Viral URI with cough   [place] [facility] Smitty Cords, DO   1 year ago Annual physical exam   [place] [facility] Smitty Cords, DO   1 year ago GAD (generalized anxiety disorder)   [place] [facility] Smitty Cords, DO   2 years ago Essential hypertension   [place] [facility] [place], Netta Neat, DO               sertraline (ZOLOFT) 100 MG tablet [Pharmacy Med Name: SERTRALINE HCL 100 MG TABLET] 135 tablet 0    Sig: TAKE 1.5 TABLET BY MOUTH DAILY     Psychiatry:  Antidepressants - SSRI - sertraline Failed - 12/21/2023  3:59 PM      Failed - AST in normal range and within 360 days    AST  Date Value Ref Range Status  06/30/2022 26 15 - 41 U/L Final   SGOT(AST)  Date Value Ref Range Status  02/19/2015 21 U/L Final    Comment:    15-41 NOTE: New Reference Range  01/05/15          Failed - ALT in normal range and within 360 days    ALT  Date Value Ref Range Status  06/30/2022 24 0 - 44 U/L Final   SGPT (ALT)  Date Value Ref Range Status  02/19/2015 19 U/L Final    Comment:    14-54 NOTE:  New Reference Range  01/05/15          Failed - Valid encounter within last 6 months    Recent Outpatient Visits           1 year ago Costochondritis   [place] [facility] Smitty Cords, DO   1 year ago Viral URI with cough   [place] [facility] Smitty Cords, DO   1 year ago Annual physical exam   [place] [facility] Smitty Cords, DO   1 year ago GAD (generalized anxiety disorder)   [place] [facility] Smitty Cords, DO   2 years ago Essential hypertension   [place] [facility] [place], Netta Neat, DO              Passed - Completed PHQ-2 or PHQ-9 in the last 360 days

## 2023-12-24 ENCOUNTER — Telehealth: Payer: Self-pay

## 2023-12-24 NOTE — Telephone Encounter (Signed)
 Requested medication (s) are due for refill today - yes  Requested medication (s) are on the active medication list -yes  Future visit scheduled no  Last refill: 08/28/23 #135  Notes to clinic: Attempted to call patient to schedule- left message to call foe appointment- fails visit and lab protocol  Requested Prescriptions  Pending Prescriptions Disp Refills   sertraline (ZOLOFT) 100 MG tablet [Pharmacy Med Name: SERTRALINE HCL 100 MG TABLET] 135 tablet 0    Sig: TAKE 1.5 TABLET BY MOUTH DAILY     Psychiatry:  Antidepressants - SSRI - sertraline Failed - 12/24/2023 12:05 PM      Failed - AST in normal range and within 360 days    AST  Date Value Ref Range Status  06/30/2022 26 15 - 41 U/L Final   SGOT(AST)  Date Value Ref Range Status  02/19/2015 21 U/L Final    Comment:    15-41 NOTE: New Reference Range  01/05/15          Failed - ALT in normal range and within 360 days    ALT  Date Value Ref Range Status  06/30/2022 24 0 - 44 U/L Final   SGPT (ALT)  Date Value Ref Range Status  02/19/2015 19 U/L Final    Comment:    14-54 NOTE: New Reference Range  01/05/15          Failed - Valid encounter within last 6 months    Recent Outpatient Visits           1 year ago Costochondritis   [place] [facility] Smitty Cords, DO   1 year ago Viral URI with cough   [place] [facility] Smitty Cords, DO   1 year ago Annual physical exam   [place] [facility] Smitty Cords, DO   1 year ago GAD (generalized anxiety disorder)   [facility] Smitty Cords, DO   2 years ago Essential hypertension   [place] [facility] [place], Alexander J, DO              Passed - Completed PHQ-2 or PHQ-9 in the last 360 days         Requested Prescriptions  Pending Prescriptions Disp Refills   sertraline (ZOLOFT) 100 MG  tablet [Pharmacy Med Name: SERTRALINE HCL 100 MG TABLET] 135 tablet 0    Sig: TAKE 1.5 TABLET BY MOUTH DAILY     Psychiatry:  Antidepressants - SSRI - sertraline Failed - 12/24/2023 12:05 PM      Failed - AST in normal range and within 360 days    AST  Date Value Ref Range Status  06/30/2022 26 15 - 41 U/L Final   SGOT(AST)  Date Value Ref Range Status  02/19/2015 21 U/L Final    Comment:    15-41 NOTE: New Reference Range  01/05/15          Failed - ALT in normal range and within 360 days    ALT  Date Value Ref Range Status  06/30/2022 24 0 - 44 U/L Final   SGPT (ALT)  Date Value Ref Range Status  02/19/2015 19 U/L Final    Comment:    14-54 NOTE: New Reference Range  01/05/15          Failed - Valid encounter within last 6 months    Recent Outpatient Visits  1 year ago Costochondritis   [facility] Smitty Cords, DO   1 year ago Viral URI with cough   [place] [facility] Smitty Cords, DO   1 year ago Annual physical exam   [facility] Smitty Cords, DO   1 year ago GAD (generalized anxiety disorder)   [place] [facility] Smitty Cords, DO   2 years ago Essential hypertension   [facility] [place], Netta Neat, DO              Passed - Completed PHQ-2 or PHQ-9 in the last 360 days

## 2023-12-24 NOTE — Telephone Encounter (Signed)
 I denied this refill already on 12/21/23. She is overdue 1 year since last appointment.  Saralyn Pilar, DO [facility] 12/24/2023, 10:39 AM

## 2024-01-01 ENCOUNTER — Inpatient Hospital Stay: Payer: Medicare Other | Attending: Oncology | Admitting: Oncology

## 2024-01-01 VITALS — BP 144/80 | HR 100 | Temp 99.1°F | Resp 16 | Wt 185.5 lb

## 2024-01-01 DIAGNOSIS — Z853 Personal history of malignant neoplasm of breast: Secondary | ICD-10-CM | POA: Diagnosis not present

## 2024-01-01 DIAGNOSIS — Z08 Encounter for follow-up examination after completed treatment for malignant neoplasm: Secondary | ICD-10-CM | POA: Diagnosis not present

## 2024-01-01 DIAGNOSIS — Z923 Personal history of irradiation: Secondary | ICD-10-CM | POA: Insufficient documentation

## 2024-01-01 DIAGNOSIS — Z87891 Personal history of nicotine dependence: Secondary | ICD-10-CM | POA: Diagnosis not present

## 2024-01-01 DIAGNOSIS — Z86711 Personal history of pulmonary embolism: Secondary | ICD-10-CM | POA: Diagnosis not present

## 2024-01-01 DIAGNOSIS — Z8052 Family history of malignant neoplasm of bladder: Secondary | ICD-10-CM | POA: Insufficient documentation

## 2024-01-01 DIAGNOSIS — M810 Age-related osteoporosis without current pathological fracture: Secondary | ICD-10-CM | POA: Diagnosis not present

## 2024-01-01 DIAGNOSIS — Z9011 Acquired absence of right breast and nipple: Secondary | ICD-10-CM | POA: Insufficient documentation

## 2024-01-01 DIAGNOSIS — Z7901 Long term (current) use of anticoagulants: Secondary | ICD-10-CM | POA: Insufficient documentation

## 2024-01-01 NOTE — Progress Notes (Unsigned)
 Patient denies new or acute problems/concerns today.

## 2024-01-02 ENCOUNTER — Encounter: Payer: Self-pay | Admitting: Family Medicine

## 2024-01-02 ENCOUNTER — Ambulatory Visit: Payer: Medicare Other | Admitting: Family Medicine

## 2024-01-02 VITALS — BP 120/72 | HR 96 | Ht 64.0 in | Wt 187.0 lb

## 2024-01-02 DIAGNOSIS — L821 Other seborrheic keratosis: Secondary | ICD-10-CM

## 2024-01-02 DIAGNOSIS — L570 Actinic keratosis: Secondary | ICD-10-CM

## 2024-01-02 MED ORDER — TRIAMCINOLONE ACETONIDE 0.1 % EX CREA
1.0000 | TOPICAL_CREAM | Freq: Two times a day (BID) | CUTANEOUS | 0 refills | Status: AC
Start: 2024-01-02 — End: ?

## 2024-01-02 NOTE — Patient Instructions (Addendum)
 Thank you for coming to the office today.   Seborrheic Keratoses of forehead, benign, not harmful. Try the topical triamcinolone cream up to twice a day for 1-2 weeks to see if it helps.  They may need to freeze them with cryotherapy  Referral to [facility] - Dr Curtis Sites  [facility] [address] [place] [address] Tel [phone]   Please schedule a Follow-up Appointment to: Return if symptoms worsen or fail to improve.  If you have any other questions or concerns, please feel free to call the office or send a message through MyChart. You may also schedule an earlier appointment if necessary.  Additionally, you may be receiving a survey about your experience at our office within a few days to 1 week by e-mail or mail. We value your feedback.  Saralyn Pilar, DO [facility], [place]

## 2024-01-02 NOTE — Progress Notes (Addendum)
 Subjective:    Patient ID: Heidi [name], female    DOB: 1941-03-13, 83 y.o.   MRN: [id number]  Heidi [name] is a 83 y.o. female presenting on 01/02/2024 for Rash   HPI  Discussed the use of AI scribe software for clinical note transcription with the patient, who gave verbal consent to proceed.  History of Present Illness   [name] is an 83 year old female who presents with a scalp rash.  She has had a scalp rash for approximately one month, characterized by raised, rough, scaly patches with well-defined edges and borders. Initially, there was only one spot, but additional spots have since appeared. The rash is slightly dry but does not flake or produce dandruff.  She has a history of similar spots on her arms, which were previously treated by freezing. She recalls seeing a dermatologist about nine years ago for other skin issues, including Mohs surgery at [facility] in [place].  No similar patches on the back of her neck, elbows, or knees. She is concerned about the appearance of the spots, stating 'I feel bad' about them being there.           01/02/2024    3:00 PM 02/23/2023    3:45 PM 07/26/2022    2:41 PM  Depression screen PHQ 2/9  Decreased Interest 1 0 1  Down, Depressed, Hopeless 1 0 1  PHQ - 2 Score 2 0 2  Altered sleeping 0 0 0  Tired, decreased energy 0 0 0  Change in appetite 0 0 0  Feeling bad or failure about yourself  0 0 0  Trouble concentrating 0 0 0  Moving slowly or fidgety/restless 0 0 0  Suicidal thoughts 0 0 0  PHQ-9 Score 2 0 2  Difficult doing work/chores Not difficult at all Not difficult at all Not difficult at all       01/02/2024    3:00 PM 07/26/2022    2:41 PM 01/09/2022    3:45 PM 07/06/2021    2:23 PM  GAD 7 : Generalized Anxiety Score  Nervous, Anxious, on Edge 1 1 1 1   Control/stop worrying 0 0 0 0  Worry too much - different things 0 0 0 0  Trouble relaxing 0 0 0 0  Restless 0 0 0 0  Easily annoyed or irritable 0 0 0 0   Afraid - awful might happen 0 0 0 0  Total GAD 7 Score 1 1 1 1   Anxiety Difficulty Not difficult at all Not difficult at all Not difficult at all Not difficult at all    Social History   Tobacco Use   Smoking status: Former    Current packs/day: 0.00    Average packs/day: 2.0 packs/day for 25.0 years (50.0 ttl pk-yrs)    Types: Cigarettes    Start date: 04/29/1967    Quit date: 04/28/1992    Years since quitting: 31.7   Smokeless tobacco: Former   Tobacco comments:    Pt quit 1995  Vaping Use   Vaping status: Never Used  Substance Use Topics   Alcohol use: No    Alcohol/week: 0.0 standard drinks of alcohol   Drug use: No    Review of Systems Per HPI unless specifically indicated above     Objective:    BP 120/72   Pulse 96   Ht 5\' 4"  (1.626 m)   Wt 187 lb (84.8 kg)   SpO2 99%   BMI  32.10 kg/m   Wt Readings from Last 3 Encounters:  01/02/24 187 lb (84.8 kg)  01/01/24 185 lb 8 oz (84.1 kg)  07/06/23 190 lb 1.6 oz (86.2 kg)    Physical Exam Vitals and nursing note reviewed.  Constitutional:      General: She is not in acute distress.    Appearance: Normal appearance. She is well-developed. She is not diaphoretic.     Comments: Well-appearing, comfortable, cooperative  HENT:     Head: Normocephalic and atraumatic.  Eyes:     General:        Right eye: No discharge.        Left eye: No discharge.     Conjunctiva/sclera: Conjunctivae normal.  Cardiovascular:     Rate and Rhythm: Normal rate.  Pulmonary:     Effort: Pulmonary effort is normal.  Skin:    General: Skin is warm and dry.     Findings: Rash (well defined spots on forehead about 1 cm or less each several spots slight raised dry flaky. No pattern of scalp psoriasis seen on exam. Posterior neck has some slight redness but no scale. No seborrheic dermatitis or flaking.) present. No erythema.  Neurological:     Mental Status: She is alert and oriented to person, place, and time.  Psychiatric:         Mood and Affect: Mood normal.        Behavior: Behavior normal.        Thought Content: Thought content normal.     Comments: Well groomed, good eye contact, normal speech and thoughts     Forehead    Results for orders placed or performed in visit on 08/03/22  Vitamin B12   Collection Time: 08/04/22  9:51 AM  Result Value Ref Range   Vitamin B-12 647 200 - 1,100 pg/mL  TSH   Collection Time: 08/04/22  9:51 AM  Result Value Ref Range   TSH 2.88 0.40 - 4.50 mIU/L  Hemoglobin A1c   Collection Time: 08/04/22  9:51 AM  Result Value Ref Range   Hgb A1c MFr Bld 5.1 <5.7 % of total Hgb   Mean Plasma Glucose 100 mg/dL   eAG (mmol/L) 5.5 mmol/L  Lipid panel   Collection Time: 08/04/22  9:51 AM  Result Value Ref Range   Cholesterol 185 <200 mg/dL   HDL 81 > OR = 50 mg/dL   Triglycerides 161 <096 mg/dL   LDL Cholesterol (Calc) 82 mg/dL (calc)   Total CHOL/HDL Ratio 2.3 <5.0 (calc)   Non-HDL Cholesterol (Calc) 104 <130 mg/dL (calc)      Assessment & Plan:   Problem List Items Addressed This Visit   None Visit Diagnoses       Seborrheic keratoses    -  Primary   Relevant Medications   triamcinolone cream (KENALOG) 0.1 %   Other Relevant Orders   Ambulatory referral to Dermatology     Actinic keratoses       Relevant Orders   Ambulatory referral to Dermatology        Scalp / Forehead rash Actinic vs Seborrheic Keratosis Lesions identified as seborrheic keratoses, benign with no significant symptoms.  - Refer to [facility] for evaluation and potential cryotherapy. - Prescribe topical triamcinolone cream twice daily for 1-2 weeks. - Photograph lesions for dermatologist evaluation. - Coordinate referral to Dr. Curtis Sites at [facility], [facility].     [facility] - Dr Curtis Sites  [facility] Office [address] [place] [address] Tel [phone]  Orders Placed This Encounter  Procedures   Ambulatory referral to  Dermatology    Referral Priority:   Routine    Referral Type:   Consultation    Referral Reason:   Specialty Services Required    Requested Specialty:   Dermatology    Number of Visits Requested:   1    Meds ordered this encounter  Medications   triamcinolone cream (KENALOG) 0.1 %    Sig: Apply 1 Application topically 2 (two) times daily. For 1-2 weeks    Dispense:  30 g    Refill:  0    Follow up plan: Return if symptoms worsen or fail to improve.   Saralyn Pilar, DO [facility] 01/02/2024, 2:43 PM

## 2024-01-03 NOTE — Progress Notes (Signed)
 Hematology/Oncology Consult note [facility]  [phone] [phone]  Patient Care Team: Smitty Cords, DO as PCP - General (Family Medicine) Creig Hines, MD as Consulting Physician (Oncology)   Name of the patient: Heidi [name]  [id number]  13-Mar-1941   Date of visit: 01/03/24  Diagnosis- h/o stage II left breast cancer ER/PR positive and HER-2/neu negative     Chief complaint/ Reason for visit- routine f/u of breast cancer  Heme/Onc history:  Oncology History Overview Note  # March 2016- LEFT BREAST CANCER STAGE II ER- 90%; PR-90%; Her 2 neu- NEG; NEO-ADJ CHEMO-  AC x4- Taxol x12 w;;s/p LUMPEC & SLNBx [ypT1 [1.5cm]; ypsN 3/6 (2 macro & 1 micro met)]  s/p RT [Nov 1st week;finish] NOV 2016- START FEMARA stop Feb 2017; April 2017- start Arimidex  # Right Breast cancer ? Stage;  s/p Mastec [1990;s/p Chemo; RT ]  # PN G-2 on neurontin; Hx PE on eliquis  # Mediport   Cancer of overlapping sites of left female breast (HCC) (Resolved)  06/09/2016 Initial Diagnosis   Cancer of overlapping sites of left female breast [facility])     Patient completed 5 years of Arimidex in November 2021.  She is on weekly Fosamax for osteoporosis   Interval history- She is ambulating better. No recent falls. Denies any breast concerns  ECOG PS- 2 Pain scale- 0   Review of systems- Review of Systems  Constitutional:  Positive for malaise/fatigue.      Allergies  Allergen Reactions   Other Anaphylaxis    Uncoded Allergy. Allergen: HYDROCHLORLIC ACID- causes water blisters   Diphenhydramine Other (See Comments)    pt states makes legs very restless and ache   Hydrochloric Acid Hives    Other reaction(s): Blisters Uncoded Allergy. Allergen: HYDROCHLORLIC ACID.     Past Medical History:  Diagnosis Date   Anemia    Anxiety    Breast cancer (HCC) 1990   RT MASTECTOMY   C. difficile diarrhea    see dr Mechele Collin and this is her second  course of vancomycin   Cataracts, bilateral    Depression    Dyspnea    with exersion   Hepatitis 1960's   does not know which type   Hyperlipidemia    Hypertension    Insomnia    Neuropathy due to chemotherapeutic drug (HCC)    Osteoarthritis    Osteoporosis    Personal history of radiation therapy 2016   LEFT lumpectomy     Past Surgical History:  Procedure Laterality Date   APPENDECTOMY  1970's   BREAST BIOPSY Left 05/07/2015   Procedure: BREAST BIOPSY WITH NEEDLE LOCALIZATION;  Surgeon: Tiney Rouge III, MD;  Location: [facility] ORS;  Service: General;  Laterality: Left;   BREAST LUMPECTOMY Left 2016   BREAST LUMPECTOMY WITH SENTINEL LYMPH NODE BIOPSY Left 05/07/2015   Procedure: PARTIAL MASTECTOMY WITH SENTINEL LYMPH NODE BX;  Surgeon: Tiney Rouge III, MD;  Location: [facility] ORS;  Service: General;  Laterality: Left;   BREAST SURGERY Right 1990   mastectomy   EYE SURGERY Bilateral    cataract extractions   MASTECTOMY Right    1990's   PORT-A-CATH REMOVAL Right 03/07/2017   Procedure: REMOVAL PORT-A-CATH;  Surgeon: Ancil Linsey, MD;  Location: [facility] ORS;  Service: General;  Laterality: Right;    Social History   Socioeconomic History   Marital status: Divorced    Spouse name: Not on file   Number of children: Not  on file   Years of education: Not on file   Highest education level: Associate degree: academic program  Occupational History   Not on file  Tobacco Use   Smoking status: Former    Current packs/day: 0.00    Average packs/day: 2.0 packs/day for 25.0 years (50.0 ttl pk-yrs)    Types: Cigarettes    Start date: 04/29/1967    Quit date: 04/28/1992    Years since quitting: 31.7   Smokeless tobacco: Former   Tobacco comments:    Pt quit 1995  Vaping Use   Vaping status: Never Used  Substance and Sexual Activity   Alcohol use: No    Alcohol/week: 0.0 standard drinks of alcohol   Drug use: No   Sexual activity: Never  Other Topics Concern   Not on file  Social  History Narrative   Not on file   Social Drivers of Health   Financial Resource Strain: Low Risk  (01/01/2024)   Overall Financial Resource Strain (CARDIA)    Difficulty of Paying Living Expenses: Not very hard  Food Insecurity: No Food Insecurity (01/01/2024)   Hunger Vital Sign    Worried About Running Out of Food in the Last Year: Never true    Ran Out of Food in the Last Year: Never true  Transportation Needs: No Transportation Needs (01/01/2024)   PRAPARE - Administrator, Civil Service (Medical): No    Lack of Transportation (Non-Medical): No  Physical Activity: Insufficiently Active (01/01/2024)   Exercise Vital Sign    Days of Exercise per Week: 1 day    Minutes of Exercise per Session: 10 min  Stress: No Stress Concern Present (01/01/2024)   [employer] of Occupational Health - Occupational Stress Questionnaire    Feeling of Stress : Only a little  Social Connections: Socially Isolated (01/01/2024)   Social Connection and Isolation Panel [NHANES]    Frequency of Communication with Friends and Family: More than three times a week    Frequency of Social Gatherings with Friends and Family: Once a week    Attends Religious Services: Never    Database administrator or Organizations: No    Attends Banker Meetings: Never    Marital Status: Divorced  Catering manager Violence: Not At Risk (02/23/2023)   Humiliation, Afraid, Rape, and Kick questionnaire    Fear of Current or Ex-Partner: No    Emotionally Abused: No    Physically Abused: No    Sexually Abused: No    Family History  Problem Relation Age of Onset   Dementia Mother    Bladder Cancer Father    Diabetes Sister    Breast cancer Neg Hx      Current Outpatient Medications:    alendronate (FOSAMAX) 70 MG tablet, TAKE 1 TABLET BY MOUTH ONCE WEEKLY ON AN EMPTY STOMACH BEFORE BREAKFAST. REMAIN UPRIGHT FOR 30 MINUTES AND TAKE WITH 8 OUNCES OF WATER (Patient not taking: Reported on 01/02/2024), Disp:  12 tablet, Rfl: 1   benztropine (COGENTIN) 1 MG tablet, Take 1 tablet (1 mg total) by mouth 2 (two) times daily as needed for tremors., Disp: 60 tablet, Rfl: 5   busPIRone (BUSPAR) 30 MG tablet, Take 2 tablets (60 mg total) by mouth daily., Disp: 180 tablet, Rfl: 3   Calcium Carb-Cholecalciferol (CALCIUM 600 + D PO), Take 1 tablet by mouth 2 (two) times a day., Disp: , Rfl:    ipratropium (ATROVENT) 0.06 % nasal spray, Place 1 spray into both  nostrils daily as needed for rhinitis. (Patient not taking: Reported on 01/02/2024), Disp: 15 mL, Rfl: 3   losartan (COZAAR) 100 MG tablet, TAKE 1 TABLET BY MOUTH DAILY, Disp: 90 tablet, Rfl: 0   Multiple Vitamin (MULTIVITAMIN WITH MINERALS) TABS tablet, Take 1 tablet by mouth at bedtime. Senior Multivitamin, Disp: , Rfl:    NON FORMULARY, Take 2 capsules by mouth daily. Dynamic brain, Disp: , Rfl:    Red Yeast Rice 600 MG CAPS, Take 2 capsules by mouth daily., Disp: , Rfl:    sertraline (ZOLOFT) 100 MG tablet, TAKE 1 AND 1/2 TABLET BY MOUTH DAILY, Disp: 135 tablet, Rfl: 0   tizanidine (ZANAFLEX) 2 MG capsule, TAKE 1 TO 2 CAPSULES BY MOUTH TWO TIMES A DAY AS NEEDED FOR MUSCLE SPASMS, Disp: 360 capsule, Rfl: 1   traZODone (DESYREL) 100 MG tablet, Take 50-100 mg by mouth at bedtime as needed for sleep. Dose adjust according to insomnia symptom, Disp: 90 tablet, Rfl: 1   Turmeric 400 MG CAPS, Take 1 capsule by mouth 2 (two) times daily., Disp: , Rfl:    b complex vitamins capsule, Take 1 capsule by mouth daily. (Patient not taking: Reported on 01/02/2024), Disp: , Rfl:    BucAlfAspKGlucCouchParsUvaUrJu (WATER PILLS PO), Take 1-2 tablets by mouth daily as needed (for fluid retention). (Patient not taking: Reported on 02/23/2023), Disp: , Rfl:    diclofenac Sodium (VOLTAREN) 1 % GEL, APPLY TWO GRAMS TOPICALLY THREE TIMES A DAY AS NEEDED FOR ARTHRITIS PAIN (Patient not taking: Reported on 01/02/2024), Disp: 100 g, Rfl: 3   triamcinolone cream (KENALOG) 0.1 %, Apply 1  Application topically 2 (two) times daily. For 1-2 weeks, Disp: 30 g, Rfl: 0  Physical exam:  Vitals:   01/01/24 1300  BP: (!) 144/80  Pulse: 100  Resp: 16  Temp: 99.1 F (37.3 C)  TempSrc: Tympanic  SpO2: 96%  Weight: 185 lb 8 oz (84.1 kg)   Physical Exam Cardiovascular:     Rate and Rhythm: Normal rate and regular rhythm.     Heart sounds: Normal heart sounds.  Pulmonary:     Effort: Pulmonary effort is normal.     Breath sounds: Normal breath sounds.  Skin:    General: Skin is warm and dry.  Neurological:     Mental Status: She is alert and oriented to person, place, and time.   Breast exam: Patient is s/p right mastectomy without reconstruction.  No evidence of chest wall recurrence.  She is status post left lumpectomy.  No palpable left breast masses.  No palpable bilateral axillary adenopathy.     Latest Ref Rng & Units 06/30/2022   12:40 PM  CMP  Glucose 70 - 99 mg/dL 98   BUN 8 - 23 mg/dL 17   Creatinine 9.14 - 1.00 mg/dL 7.82   Sodium 956 - 213 mmol/L 138   Potassium 3.5 - 5.1 mmol/L 4.5   Chloride 98 - 111 mmol/L 103   CO2 22 - 32 mmol/L 26   Calcium 8.9 - 10.3 mg/dL 9.8   Total Protein 6.5 - 8.1 g/dL 7.2   Total Bilirubin 0.3 - 1.2 mg/dL 0.5   Alkaline Phos 38 - 126 U/L 65   AST 15 - 41 U/L 26   ALT 0 - 44 U/L 24       Latest Ref Rng & Units 06/30/2022   12:40 PM  CBC  WBC 4.0 - 10.5 K/uL 6.1   Hemoglobin 12.0 - 15.0 g/dL 12.8  Hematocrit 36.0 - 46.0 % 38.1   Platelets 150 - 400 K/uL 241     No images are attached to the encounter.  No results found.   Assessment and plan- Patient is a 83 y.o. female with history of left breast cancer stage I status post lumpectomy, adjuvant radiation therapy and 5 years of Arimidex as well as right mastectomy. She is here for routine f/u  Clinically patient is doing well with no signs and symptoms of recurrence based on today's exam.  Her mammogram from September 2024 was unremarkable.  She is presently on  Fosamax for her osteoporosis which she will continue.  I will see her back in 1 year no labs and schedule her mammogram for this year as well.   Visit Diagnosis 1. Osteoporosis without current pathological fracture, unspecified osteoporosis type   2. History of breast cancer   3. Encounter for follow-up surveillance of breast cancer      Dr. Owens Shark, MD, MPH [facility] [phone] 01/03/2024 2:21 PM

## 2024-01-04 ENCOUNTER — Ambulatory Visit: Payer: Medicare Other | Admitting: Oncology

## 2024-01-16 DIAGNOSIS — L218 Other seborrheic dermatitis: Secondary | ICD-10-CM | POA: Diagnosis not present

## 2024-01-16 DIAGNOSIS — C44311 Basal cell carcinoma of skin of nose: Secondary | ICD-10-CM | POA: Diagnosis not present

## 2024-01-16 DIAGNOSIS — L820 Inflamed seborrheic keratosis: Secondary | ICD-10-CM | POA: Diagnosis not present

## 2024-01-16 DIAGNOSIS — L570 Actinic keratosis: Secondary | ICD-10-CM | POA: Diagnosis not present

## 2024-01-16 DIAGNOSIS — Z85828 Personal history of other malignant neoplasm of skin: Secondary | ICD-10-CM | POA: Diagnosis not present

## 2024-01-16 DIAGNOSIS — D485 Neoplasm of uncertain behavior of skin: Secondary | ICD-10-CM | POA: Diagnosis not present

## 2024-01-16 DIAGNOSIS — L821 Other seborrheic keratosis: Secondary | ICD-10-CM | POA: Diagnosis not present

## 2024-01-16 DIAGNOSIS — L538 Other specified erythematous conditions: Secondary | ICD-10-CM | POA: Diagnosis not present

## 2024-01-16 DIAGNOSIS — Z08 Encounter for follow-up examination after completed treatment for malignant neoplasm: Secondary | ICD-10-CM | POA: Diagnosis not present

## 2024-01-25 ENCOUNTER — Other Ambulatory Visit: Payer: Self-pay | Admitting: Family Medicine

## 2024-01-25 DIAGNOSIS — M545 Low back pain, unspecified: Secondary | ICD-10-CM

## 2024-01-25 DIAGNOSIS — M47816 Spondylosis without myelopathy or radiculopathy, lumbar region: Secondary | ICD-10-CM

## 2024-01-25 DIAGNOSIS — I10 Essential (primary) hypertension: Secondary | ICD-10-CM

## 2024-01-28 ENCOUNTER — Other Ambulatory Visit: Payer: Self-pay

## 2024-01-28 DIAGNOSIS — I10 Essential (primary) hypertension: Secondary | ICD-10-CM

## 2024-01-28 MED ORDER — LOSARTAN POTASSIUM 100 MG PO TABS: 100.0000 mg | ORAL_TABLET | Freq: Every day | ORAL | 0 refills | Status: DC

## 2024-01-28 NOTE — Telephone Encounter (Signed)
 Requested medication (s) are due for refill today: yes  Requested medication (s) are on the active medication list: yes  Last refill:  07/24/23 #360 1 RF  Future visit scheduled: no  Notes to clinic:  med not delegated to NT to RF   Requested Prescriptions  Pending Prescriptions Disp Refills   tizanidine (ZANAFLEX) 2 MG capsule [Pharmacy Med Name: tiZANidine HCL 2 MG CAPSULE] 360 capsule     Sig: TAKE 1-2 CAPSULES BY MOUTH TWO TIMES A DAY AS NEEDED FOR MUSCLE SPASMS     Not Delegated - Cardiovascular:  Alpha-2 Agonists - tizanidine Failed - 01/28/2024  1:59 PM      Failed - This refill cannot be delegated      Failed - Valid encounter within last 6 months    Recent Outpatient Visits           3 weeks ago Seborrheic keratoses   [facility], Netta Neat, DO              Refused Prescriptions Disp Refills   losartan (COZAAR) 100 MG tablet [Pharmacy Med Name: LOSARTAN POTASSIUM 100 MG TAB] 90 tablet     Sig: TAKE 1 TABLET BY MOUTH DAILY     Cardiovascular:  Angiotensin Receptor Blockers Failed - 01/28/2024  1:59 PM      Failed - Cr in normal range and within 180 days    Creatinine  Date Value Ref Range Status  02/19/2015 0.46 mg/dL Final    Comment:    8.29-5.62 NOTE: New Reference Range  01/05/15    Creatinine, Ser  Date Value Ref Range Status  06/30/2022 0.70 0.44 - 1.00 mg/dL Final   Creatine, Serum  Date Value Ref Range Status  02/24/2015 0.90  Final         Failed - K in normal range and within 180 days    Potassium  Date Value Ref Range Status  06/30/2022 4.5 3.5 - 5.1 mmol/L Final  02/19/2015 4.0 mmol/L Final    Comment:    3.5-5.1 NOTE: New Reference Range  01/05/15          Failed - Valid encounter within last 6 months    Recent Outpatient Visits           3 weeks ago Seborrheic keratoses   [place] [facility] Smitty Cords, [place] - Patient is not  pregnant      Passed - Last BP in normal range    BP Readings from Last 1 Encounters:  01/02/24 120/72

## 2024-01-28 NOTE — Telephone Encounter (Signed)
 Requested Prescriptions  Pending Prescriptions Disp Refills   tizanidine (ZANAFLEX) 2 MG capsule [Pharmacy Med Name: tiZANidine HCL 2 MG CAPSULE] 360 capsule     Sig: TAKE 1-2 CAPSULES BY MOUTH TWO TIMES A DAY AS NEEDED FOR MUSCLE SPASMS     Not Delegated - Cardiovascular:  Alpha-2 Agonists - tizanidine Failed - 01/28/2024  1:58 PM      Failed - This refill cannot be delegated      Failed - Valid encounter within last 6 months    Recent Outpatient Visits           3 weeks ago Seborrheic keratoses   [facility] [place], Netta Neat, DO              Refused Prescriptions Disp Refills   losartan (COZAAR) 100 MG tablet [Pharmacy Med Name: LOSARTAN POTASSIUM 100 MG TAB] 90 tablet     Sig: TAKE 1 TABLET BY MOUTH DAILY     Cardiovascular:  Angiotensin Receptor Blockers Failed - 01/28/2024  1:58 PM      Failed - Cr in normal range and within 180 days    Creatinine  Date Value Ref Range Status  02/19/2015 0.46 mg/dL Final    Comment:    8.11-9.14 NOTE: New Reference Range  01/05/15    Creatinine, Ser  Date Value Ref Range Status  06/30/2022 0.70 0.44 - 1.00 mg/dL Final   Creatine, Serum  Date Value Ref Range Status  02/24/2015 0.90  Final         Failed - K in normal range and within 180 days    Potassium  Date Value Ref Range Status  06/30/2022 4.5 3.5 - 5.1 mmol/L Final  02/19/2015 4.0 mmol/L Final    Comment:    3.5-5.1 NOTE: New Reference Range  01/05/15          Failed - Valid encounter within last 6 months    Recent Outpatient Visits           3 weeks ago Seborrheic keratoses   [facility] [place], [place] - Patient is not pregnant      Passed - Last BP in normal range    BP Readings from Last 1 Encounters:  01/02/24 120/72

## 2024-02-19 DIAGNOSIS — L570 Actinic keratosis: Secondary | ICD-10-CM | POA: Diagnosis not present

## 2024-02-19 DIAGNOSIS — C44311 Basal cell carcinoma of skin of nose: Secondary | ICD-10-CM | POA: Diagnosis not present

## 2024-02-24 ENCOUNTER — Other Ambulatory Visit: Payer: Self-pay | Admitting: Family Medicine

## 2024-02-24 DIAGNOSIS — F411 Generalized anxiety disorder: Secondary | ICD-10-CM

## 2024-02-26 NOTE — Telephone Encounter (Signed)
 Requested Prescriptions  Pending Prescriptions Disp Refills   busPIRone  (BUSPAR ) 30 MG tablet [Pharmacy Med Name: busPIRone  HCL 30 MG TABLET] 180 tablet 0    Sig: TAKE 2 TABLETS BY MOUTH DAILY     Psychiatry: Anxiolytics/Hypnotics - Non-controlled Failed - 02/26/2024 12:40 PM      Failed - Valid encounter within last 12 months    Recent Outpatient Visits           1 month ago Seborrheic keratoses   [facility] [place], Kayleen Party, [place]

## 2024-03-21 DIAGNOSIS — C44311 Basal cell carcinoma of skin of nose: Secondary | ICD-10-CM | POA: Diagnosis not present

## 2024-03-21 DIAGNOSIS — M950 Acquired deformity of nose: Secondary | ICD-10-CM | POA: Diagnosis not present

## 2024-04-02 ENCOUNTER — Other Ambulatory Visit: Payer: Self-pay | Admitting: Family Medicine

## 2024-04-02 DIAGNOSIS — F330 Major depressive disorder, recurrent, mild: Secondary | ICD-10-CM

## 2024-04-02 DIAGNOSIS — F411 Generalized anxiety disorder: Secondary | ICD-10-CM

## 2024-04-03 ENCOUNTER — Other Ambulatory Visit: Payer: Self-pay

## 2024-04-03 DIAGNOSIS — F330 Major depressive disorder, recurrent, mild: Secondary | ICD-10-CM

## 2024-04-03 DIAGNOSIS — F411 Generalized anxiety disorder: Secondary | ICD-10-CM

## 2024-04-03 MED ORDER — SERTRALINE HCL 100 MG PO TABS: 150.0000 mg | ORAL_TABLET | Freq: Every day | ORAL | 0 refills | Status: DC

## 2024-04-03 NOTE — Telephone Encounter (Signed)
 Requested Prescriptions  Refused Prescriptions Disp Refills   sertraline  (ZOLOFT ) 100 MG tablet [Pharmacy Med Name: SERTRALINE  HCL 100 MG TABLET] 135 tablet 0    Sig: TAKE 1.5 TABLET BY MOUTH DAILY     Psychiatry:  Antidepressants - SSRI - sertraline  Failed - 04/03/2024  2:18 PM      Failed - AST in normal range and within 360 days    AST  Date Value Ref Range Status  06/30/2022 26 15 - 41 U/L Final   SGOT(AST)  Date Value Ref Range Status  02/19/2015 21 U/L Final    Comment:    15-41 NOTE: New Reference Range  01/05/15          Failed - ALT in normal range and within 360 days    ALT  Date Value Ref Range Status  06/30/2022 24 0 - 44 U/L Final   SGPT (ALT)  Date Value Ref Range Status  02/19/2015 19 U/L Final    Comment:    14-54 NOTE: New Reference Range  01/05/15          Failed - Valid encounter within last 6 months    Recent Outpatient Visits           3 months ago Seborrheic keratoses   [facility] [place], Kayleen Party, DO              Passed - Completed PHQ-2 or PHQ-9 in the last 360 days

## 2024-04-18 DIAGNOSIS — M950 Acquired deformity of nose: Secondary | ICD-10-CM | POA: Diagnosis not present

## 2024-04-29 ENCOUNTER — Other Ambulatory Visit: Payer: Self-pay | Admitting: Family Medicine

## 2024-04-29 DIAGNOSIS — F330 Major depressive disorder, recurrent, mild: Secondary | ICD-10-CM

## 2024-04-29 DIAGNOSIS — F411 Generalized anxiety disorder: Secondary | ICD-10-CM

## 2024-04-30 ENCOUNTER — Other Ambulatory Visit: Payer: Self-pay

## 2024-04-30 ENCOUNTER — Other Ambulatory Visit: Payer: Self-pay | Admitting: Family Medicine

## 2024-04-30 DIAGNOSIS — I10 Essential (primary) hypertension: Secondary | ICD-10-CM

## 2024-05-01 ENCOUNTER — Other Ambulatory Visit: Payer: Self-pay

## 2024-05-01 DIAGNOSIS — I10 Essential (primary) hypertension: Secondary | ICD-10-CM

## 2024-05-01 MED ORDER — LOSARTAN POTASSIUM 100 MG PO TABS
100.0000 mg | ORAL_TABLET | Freq: Every day | ORAL | 0 refills | Status: DC
Start: 2024-05-01 — End: 2024-08-18

## 2024-05-01 NOTE — Telephone Encounter (Signed)
 Rx 04/03/24 #135- too soon Requested Prescriptions  Pending Prescriptions Disp Refills   sertraline  (ZOLOFT ) 100 MG tablet [Pharmacy Med Name: SERTRALINE  HCL 100 MG TABLET] 135 tablet 0    Sig: TAKE 1.5 TABLET BY MOUTH DAILY     Psychiatry:  Antidepressants - SSRI - sertraline  Failed - 05/01/2024  1:20 PM      Failed - AST in normal range and within 360 days    AST  Date Value Ref Range Status  06/30/2022 26 15 - 41 U/L Final   SGOT(AST)  Date Value Ref Range Status  02/19/2015 21 U/L Final    Comment:    15-41 NOTE: New Reference Range  01/05/15          Failed - ALT in normal range and within 360 days    ALT  Date Value Ref Range Status  06/30/2022 24 0 - 44 U/L Final   SGPT (ALT)  Date Value Ref Range Status  02/19/2015 19 U/L Final    Comment:    14-54 NOTE: New Reference Range  01/05/15          Passed - Completed PHQ-2 or PHQ-9 in the last 360 days      Passed - Valid encounter within last 6 months    Recent Outpatient Visits           4 months ago Seborrheic keratoses   [facility] [place], Marsa PARAS, DO

## 2024-05-02 NOTE — Telephone Encounter (Signed)
 Requested Prescriptions  Refused Prescriptions Disp Refills   losartan  (COZAAR ) 100 MG tablet [Pharmacy Med Name: LOSARTAN  POTASSIUM 100 MG TAB] 90 tablet 0    Sig: TAKE 1 TABLET BY MOUTH DAILY     Cardiovascular:  Angiotensin Receptor Blockers Failed - 05/02/2024  6:12 PM      Failed - Cr in normal range and within 180 days    Creatinine  Date Value Ref Range Status  02/19/2015 0.46 mg/dL Final    Comment:    9.55-8.99 NOTE: New Reference Range  01/05/15    Creatinine, Ser  Date Value Ref Range Status  06/30/2022 0.70 0.44 - 1.00 mg/dL Final   Creatine, Serum  Date Value Ref Range Status  02/24/2015 0.90  Final         Failed - K in normal range and within 180 days    Potassium  Date Value Ref Range Status  06/30/2022 4.5 3.5 - 5.1 mmol/L Final  02/19/2015 4.0 mmol/L Final    Comment:    3.5-5.1 NOTE: New Reference Range  01/05/15          Passed - Patient is not pregnant      Passed - Last BP in normal range    BP Readings from Last 1 Encounters:  01/02/24 120/72         Passed - Valid encounter within last 6 months    Recent Outpatient Visits           4 months ago Seborrheic keratoses   [facility] [place], Marsa PARAS, DO

## 2024-05-09 ENCOUNTER — Ambulatory Visit

## 2024-05-09 DIAGNOSIS — Z0000 Encounter for general adult medical examination without abnormal findings: Secondary | ICD-10-CM

## 2024-05-09 NOTE — Patient Instructions (Addendum)
 Heidi [name] , Thank you for taking time out of your busy schedule to complete your Annual Wellness Visit with me. I enjoyed our conversation and look forward to speaking with you again next year. I, as well as your care team,  appreciate your ongoing commitment to your health goals. Please review the following plan we discussed and let me know if I can assist you in the future.   Follow up Visits: Next Medicare AWV with our clinical staff:  05/22/25 @ 3:20 PM BY PHONE  Have you seen your provider in the last 6 months (3 months if uncontrolled diabetes)? Yes   Clinician Recommendations:  Aim for 30 minutes of exercise or brisk walking, 6-8 glasses of water, and 5 servings of fruits and vegetables each day. TAKE CARE!      This is a list of the screening recommended for you and due dates:  Health Maintenance  Topic Date Due   DTaP/Tdap/Td vaccine (2 - Td or Tdap) 07/31/2021   Zoster (Shingles) Vaccine (2 of 2) 09/11/2022   COVID-19 Vaccine (5 - 2024-25 season) 07/01/2023   Flu Shot  05/30/2024   Mammogram  07/05/2024   Medicare Annual Wellness Visit  05/09/2025   DEXA scan (bone density measurement)  07/02/2025   Pneumococcal Vaccine for age over 68  Completed   Hepatitis B Vaccine  Aged Out   HPV Vaccine  Aged Out   Meningitis B Vaccine  Aged Out    Advanced directives: (ACP Link)Information on Advanced Care Planning can be found at [employer] Advance Health Care Directives Advance Health Care Directives. [email or web address]  Advance Care Planning is important because it:  [x]  Makes sure you receive the medical care that is consistent with your values, goals, and preferences  [x]  It provides guidance to your family and loved ones and reduces their decisional burden about whether or not they are making the right decisions based on your wishes.  Follow the link provided in your after visit summary or read over the paperwork we have mailed to you to help you started getting  your Advance Directives in place. If you need assistance in completing these, please reach out to us  so that we can help you!

## 2024-05-09 NOTE — Progress Notes (Signed)
 Subjective:   Heidi [name] is a 83 y.o. who presents for a Medicare Wellness preventive visit.  As a reminder, Annual Wellness Visits don't include a physical exam, and some assessments may be limited, especially if this visit is performed virtually. We may recommend an in-person follow-up visit with your provider if needed.  Visit Complete: Virtual I connected with  Heidi [name] on 05/09/24 by a audio enabled telemedicine application and verified that I am speaking with the correct person using two identifiers.  Patient Location: Home  Provider Location: Home Office  I discussed the limitations of evaluation and management by telemedicine. The patient expressed understanding and agreed to proceed.  Vital Signs: Because this visit was a virtual/telehealth visit, some criteria may be missing or patient reported. Any vitals not documented were not able to be obtained and vitals that have been documented are patient reported.  VideoDeclined- This patient declined Librarian, academic. Therefore the visit was completed with audio only.  Persons Participating in Visit: Patient.  AWV Questionnaire: No: Patient Medicare AWV questionnaire was not completed prior to this visit.  Cardiac Risk Factors include: advanced age (>45men, >82 women);hypertension;sedentary lifestyle;obesity (BMI >30kg/m2)     Objective:    There were no vitals filed for this visit. There is no height or weight on file to calculate BMI.     05/09/2024    3:32 PM 07/06/2023    1:50 PM 02/23/2023    3:47 PM 11/20/2022    1:17 PM 06/30/2022    1:11 PM 12/09/2021    2:44 PM 06/30/2021    2:04 PM  Advanced Directives  Does Patient Have a Medical Advance Directive? No No No No No No No  Would patient like information on creating a medical advance directive? No - Patient declined No - Patient declined No - Patient declined No - Patient declined Yes (MAU/Ambulatory/Procedural Areas - Information  given) No - Patient declined No - Patient declined    Current Medications (verified) Outpatient Encounter Medications as of 05/09/2024  Medication Sig   busPIRone  (BUSPAR ) 30 MG tablet TAKE 2 TABLETS BY MOUTH DAILY   Calcium Carb-Cholecalciferol (CALCIUM 600 + D PO) Take 1 tablet by mouth 2 (two) times a day.   diclofenac  Sodium (VOLTAREN ) 1 % GEL APPLY TWO GRAMS TOPICALLY THREE TIMES A DAY AS NEEDED FOR ARTHRITIS PAIN   ipratropium (ATROVENT ) 0.06 % nasal spray Place 1 spray into both nostrils daily as needed for rhinitis.   losartan  (COZAAR ) 100 MG tablet Take 1 tablet (100 mg total) by mouth daily.   Multiple Vitamin (MULTIVITAMIN WITH MINERALS) TABS tablet Take 1 tablet by mouth at bedtime. Senior Multivitamin   NON FORMULARY Take 2 capsules by mouth daily. Dynamic brain   Red Yeast Rice 600 MG CAPS Take 2 capsules by mouth daily.   sertraline  (ZOLOFT ) 100 MG tablet Take 1.5 tablets (150 mg total) by mouth daily.   tizanidine  (ZANAFLEX ) 2 MG capsule TAKE 1-2 CAPSULES BY MOUTH TWO TIMES A DAY AS NEEDED FOR MUSCLE SPASMS   traZODone  (DESYREL ) 100 MG tablet Take 50-100 mg by mouth at bedtime as needed for sleep. Dose adjust according to insomnia symptom   triamcinolone  cream (KENALOG ) 0.1 % Apply 1 Application topically 2 (two) times daily. For 1-2 weeks   Turmeric 400 MG CAPS Take 1 capsule by mouth 2 (two) times daily.   alendronate  (FOSAMAX ) 70 MG tablet TAKE 1 TABLET BY MOUTH ONCE WEEKLY ON AN EMPTY STOMACH BEFORE BREAKFAST. REMAIN  UPRIGHT FOR 30 MINUTES AND TAKE WITH 8 OUNCES OF WATER (Patient not taking: Reported on 05/09/2024)   b complex vitamins capsule Take 1 capsule by mouth daily. (Patient not taking: Reported on 05/09/2024)   benztropine  (COGENTIN ) 1 MG tablet Take 1 tablet (1 mg total) by mouth 2 (two) times daily as needed for tremors. (Patient not taking: Reported on 05/09/2024)   BucAlfAspKGlucCouchParsUvaUrJu (WATER PILLS PO) Take 1-2 tablets by mouth daily as needed (for fluid  retention). (Patient not taking: Reported on 05/09/2024)   No facility-administered encounter medications on file as of 05/09/2024.    Allergies (verified) Other, Diphenhydramine , and Hydrochloric acid   History: Past Medical History:  Diagnosis Date   Anemia    Anxiety    Breast cancer (HCC) 1990   RT MASTECTOMY   C. difficile diarrhea    see dr viktoria and this is her second course of vancomycin   Cataracts, bilateral    Depression    Dyspnea    with exersion   Hepatitis 1960's   does not know which type   Hyperlipidemia    Hypertension    Insomnia    Neuropathy due to chemotherapeutic drug (HCC)    Osteoarthritis    Osteoporosis    Personal history of radiation therapy 2016   LEFT lumpectomy   Past Surgical History:  Procedure Laterality Date   APPENDECTOMY  1970's   BREAST BIOPSY Left 05/07/2015   Procedure: BREAST BIOPSY WITH NEEDLE LOCALIZATION;  Surgeon: Elgin Laurence III, MD;  Location: [facility] ORS;  Service: General;  Laterality: Left;   BREAST LUMPECTOMY Left 2016   BREAST LUMPECTOMY WITH SENTINEL LYMPH NODE BIOPSY Left 05/07/2015   Procedure: PARTIAL MASTECTOMY WITH SENTINEL LYMPH NODE BX;  Surgeon: Elgin Laurence III, MD;  Location: [facility] ORS;  Service: General;  Laterality: Left;   BREAST SURGERY Right 1990   mastectomy   EYE SURGERY Bilateral    cataract extractions   MASTECTOMY Right    1990's   PORT-A-CATH REMOVAL Right 03/07/2017   Procedure: REMOVAL PORT-A-CATH;  Surgeon: Nicholaus Selinda Birmingham, MD;  Location: [facility] ORS;  Service: General;  Laterality: Right;   Family History  Problem Relation Age of Onset   Dementia Mother    Bladder Cancer Father    Diabetes Sister    Breast cancer Neg Hx    Social History   Socioeconomic History   Marital status: Divorced    Spouse name: Not on file   Number of children: Not on file   Years of education: Not on file   Highest education level: Associate degree: academic program  Occupational History   Not on file  Tobacco  Use   Smoking status: Former    Current packs/day: 0.00    Average packs/day: 2.0 packs/day for 25.0 years (50.0 ttl pk-yrs)    Types: Cigarettes    Start date: 04/29/1967    Quit date: 04/28/1992    Years since quitting: 32.0   Smokeless tobacco: Former   Tobacco comments:    Pt quit 1995  Vaping Use   Vaping status: Never Used  Substance and Sexual Activity   Alcohol use: No    Alcohol/week: 0.0 standard drinks of alcohol   Drug use: No   Sexual activity: Never  Other Topics Concern   Not on file  Social History Narrative   Not on file   Social Drivers of Health   Financial Resource Strain: Low Risk  (05/09/2024)   Overall Financial Resource Strain (CARDIA)  Difficulty of Paying Living Expenses: Not hard at all  Food Insecurity: No Food Insecurity (05/09/2024)   Hunger Vital Sign    Worried About Running Out of Food in the Last Year: Never true    Ran Out of Food in the Last Year: Never true  Transportation Needs: No Transportation Needs (05/09/2024)   PRAPARE - Administrator, Civil Service (Medical): No    Lack of Transportation (Non-Medical): No  Physical Activity: Insufficiently Active (05/09/2024)   Exercise Vital Sign    Days of Exercise per Week: 2 days    Minutes of Exercise per Session: 20 min  Stress: No Stress Concern Present (05/09/2024)   [employer] of Occupational Health - Occupational Stress Questionnaire    Feeling of Stress: Not at all  Social Connections: Socially Isolated (05/09/2024)   Social Connection and Isolation Panel    Frequency of Communication with Friends and Family: More than three times a week    Frequency of Social Gatherings with Friends and Family: Once a week    Attends Religious Services: Never    Database administrator or Organizations: No    Attends Engineer, structural: Never    Marital Status: Divorced    Tobacco Counseling Counseling given: Not Answered Tobacco comments: Pt quit  1995    Clinical Intake:  Pre-visit preparation completed: Yes  Pain : No/denies pain     BMI - recorded: 32.1 Nutritional Status: BMI > 30  Obese Nutritional Risks: None Diabetes: No  Lab Results  Component Value Date   HGBA1C 5.1 08/04/2022     How often do you need to have someone help you when you read instructions, pamphlets, or other written materials from your doctor or pharmacy?: 1 - Never  Interpreter Needed?: No  Information entered by :: JHONNIE DAS, LPN   Activities of Daily Living     05/09/2024    3:33 PM  In your present state of health, do you have any difficulty performing the following activities:  Hearing? 0  Vision? 0  Difficulty concentrating or making decisions? 0  Walking or climbing stairs? 0  Dressing or bathing? 0  Doing errands, shopping? 0  Preparing Food and eating ? N  Using the Toilet? N  In the past six months, have you accidently leaked urine? Y  Do you have problems with loss of bowel control? N  Managing your Medications? N  Managing your Finances? N  Housekeeping or managing your Housekeeping? N    Patient Care Team: Edman Marsa PARAS, DO as PCP - General (Family Medicine) Melanee Annah BROCKS, MD as Consulting Physician (Oncology) [facility], [facility] (Optometry)  I have updated your Care Teams any recent Medical Services you may have received from other providers in the past year.     Assessment:   This is a routine wellness examination for Heidi [name].  Hearing/Vision screen Hearing Screening - Comments:: NO AIDS Vision Screening - Comments:: HAS LENS IMPLANTS- [facility]   Goals Addressed             This Visit's Progress    Cut out extra servings         Depression Screen     05/09/2024    3:30 PM 01/02/2024    3:00 PM 02/23/2023    3:45 PM 07/26/2022    2:41 PM 01/09/2022    3:45 PM 12/09/2021    2:42 PM 07/06/2021    2:23 PM  PHQ 2/9 Scores  PHQ -  2 Score 0 2 0 2 2 0 0  PHQ- 9 Score 0 2 0 2 2  0     Fall Risk     05/09/2024    3:33 PM 01/02/2024    3:00 PM 02/23/2023    3:48 PM 07/26/2022    2:41 PM 01/09/2022    3:44 PM  Fall Risk   Falls in the past year? 1 1 0 0 0  Number falls in past yr: 0 1 0 0 0  Injury with Fall? 0 0 0 0 0  Risk for fall due to :   No Fall Risks Impaired balance/gait;Impaired mobility No Fall Risks  Follow up Falls evaluation completed;Falls prevention discussed  Falls prevention discussed;Falls evaluation completed Falls evaluation completed  Falls evaluation completed      Data saved with a previous flowsheet row definition    MEDICARE RISK AT HOME:  Medicare Risk at Home Any stairs in or around the home?: No If so, are there any without handrails?: No Home free of loose throw rugs in walkways, pet beds, electrical cords, etc?: Yes Adequate lighting in your home to reduce risk of falls?: Yes Life alert?: Yes Use of a cane, walker or w/c?: Yes (CANE SOMETIMES) Grab bars in the bathroom?: Yes Shower chair or bench in shower?: Yes Elevated toilet seat or a handicapped toilet?: No  TIMED UP AND GO:  Was the test performed?  No  Cognitive Function: 6CIT completed        05/09/2024    3:35 PM 02/23/2023    3:52 PM  6CIT Screen  What Year? 0 points 0 points  What month? 0 points 3 points  What time? 0 points 0 points  Count back from 20 0 points 0 points  Months in reverse 0 points 0 points  Repeat phrase 0 points 0 points  Total Score 0 points 3 points    Immunizations Immunization History  Administered Date(s) Administered   Fluad Quad(high Dose 65+) 09/21/2020   Influenza Inj Mdck Quad Pf 07/26/2018   Influenza, High Dose Seasonal PF 08/23/2017, 09/23/2019, 07/17/2022   Influenza,inj,Quad PF,6+ Mos 07/23/2014, 08/04/2015   Influenza-Unspecified 07/14/2010, 07/30/2012, 07/23/2014, 08/22/2014, 08/04/2015, 07/30/2016, 08/31/2019   PFIZER Comirnaty(Gray Top)Covid-19 Tri-Sucrose Vaccine 03/11/2021   PFIZER(Purple Top)SARS-COV-2 Vaccination  11/20/2019, 12/11/2019, 06/22/2020   Pneumococcal Conjugate-13 02/07/2018   Pneumococcal Polysaccharide-23 08/01/2011, 04/21/2019   Tdap 08/01/2011   Zoster Recombinant(Shingrix ) 07/17/2022   Zoster, Live 08/03/2010    Screening Tests Health Maintenance  Topic Date Due   DTaP/Tdap/Td (2 - Td or Tdap) 07/31/2021   Zoster Vaccines- Shingrix  (2 of 2) 09/11/2022   COVID-19 Vaccine (5 - 2024-25 season) 07/01/2023   INFLUENZA VACCINE  05/30/2024   MAMMOGRAM  07/05/2024   Medicare Annual Wellness (AWV)  05/09/2025   DEXA SCAN  07/02/2025   Pneumococcal Vaccine: 50+ Years  Completed   Hepatitis B Vaccines  Aged Out   HPV VACCINES  Aged Out   Meningococcal B Vaccine  Aged Out    Health Maintenance  Health Maintenance Due  Topic Date Due   DTaP/Tdap/Td (2 - Td or Tdap) 07/31/2021   Zoster Vaccines- Shingrix  (2 of 2) 09/11/2022   COVID-19 Vaccine (5 - 2024-25 season) 07/01/2023   Health Maintenance Items Addressed: UP TO DATE W/ MAMMOGRAM & BDS; AGED OUT OF COLONOSCOPY; NEEDS COVID, TDAP & PNA IN FALL  Additional Screening:  Vision Screening: Recommended annual ophthalmology exams for early detection of glaucoma and other disorders of the eye. Would you  like a referral to an eye doctor? No    Dental Screening: Recommended annual dental exams for proper oral hygiene  Community Resource Referral / Chronic Care Management: CRR required this visit?  No   CCM required this visit?  No   Plan:    I have personally reviewed and noted the following in the patient's chart:   Medical and social history Use of alcohol, tobacco or illicit drugs  Current medications and supplements including opioid prescriptions. Patient is not currently taking opioid prescriptions. Functional ability and status Nutritional status Physical activity Advanced directives List of other physicians Hospitalizations, surgeries, and ER visits in previous 12 months Vitals Screenings to include cognitive,  depression, and falls Referrals and appointments  In addition, I have reviewed and discussed with patient certain preventive protocols, quality metrics, and best practice recommendations. A written personalized care plan for preventive services as well as general preventive health recommendations were provided to patient.   Jhonnie GORMAN Das, LPN   [id number]/88/[id number]   After Visit Summary: (MyChart) Due to this being a telephonic visit, the after visit summary with patients personalized plan was offered to patient via MyChart   Notes: Nothing significant to report at this time.

## 2024-06-06 ENCOUNTER — Other Ambulatory Visit: Payer: Self-pay | Admitting: Family Medicine

## 2024-06-06 DIAGNOSIS — F411 Generalized anxiety disorder: Secondary | ICD-10-CM

## 2024-06-09 ENCOUNTER — Other Ambulatory Visit: Payer: Self-pay

## 2024-06-09 DIAGNOSIS — F411 Generalized anxiety disorder: Secondary | ICD-10-CM

## 2024-06-09 MED ORDER — BUSPIRONE HCL 30 MG PO TABS: 60.0000 mg | ORAL_TABLET | Freq: Every day | ORAL | 0 refills | Status: DC

## 2024-06-10 NOTE — Telephone Encounter (Signed)
 Duplicate request, [facility] 06/09/24. Requested Prescriptions  Pending Prescriptions Disp Refills   busPIRone  (BUSPAR ) 30 MG tablet [Pharmacy Med Name: busPIRone  HCL 30 MG TABLET] 180 tablet 0    Sig: TAKE 2 TABLETS BY MOUTH DAILY     Psychiatry: Anxiolytics/Hypnotics - Non-controlled Passed - 06/10/2024  8:52 AM      Passed - Valid encounter within last 12 months    Recent Outpatient Visits           5 months ago Seborrheic keratoses   [facility] [place], Marsa PARAS, [place]

## 2024-06-21 ENCOUNTER — Other Ambulatory Visit: Payer: Self-pay | Admitting: Family Medicine

## 2024-06-21 DIAGNOSIS — F411 Generalized anxiety disorder: Secondary | ICD-10-CM

## 2024-06-21 DIAGNOSIS — F330 Major depressive disorder, recurrent, mild: Secondary | ICD-10-CM

## 2024-06-23 ENCOUNTER — Other Ambulatory Visit: Payer: Self-pay

## 2024-06-23 DIAGNOSIS — F411 Generalized anxiety disorder: Secondary | ICD-10-CM

## 2024-06-23 DIAGNOSIS — F330 Major depressive disorder, recurrent, mild: Secondary | ICD-10-CM

## 2024-06-23 MED ORDER — SERTRALINE HCL 100 MG PO TABS: 150.0000 mg | ORAL_TABLET | Freq: Every day | ORAL | 0 refills | Status: DC

## 2024-06-23 NOTE — Telephone Encounter (Signed)
 Requested Prescriptions  Refused Prescriptions Disp Refills   sertraline  (ZOLOFT ) 100 MG tablet [Pharmacy Med Name: SERTRALINE  HCL 100 MG TABLET] 135 tablet 0    Sig: TAKE 1.5 TABLET BY MOUTH DAILY     Psychiatry:  Antidepressants - SSRI - sertraline  Failed - 06/23/2024  3:07 PM      Failed - AST in normal range and within 360 days    AST  Date Value Ref Range Status  06/30/2022 26 15 - 41 U/L Final   SGOT(AST)  Date Value Ref Range Status  02/19/2015 21 U/L Final    Comment:    15-41 NOTE: New Reference Range  01/05/15          Failed - ALT in normal range and within 360 days    ALT  Date Value Ref Range Status  06/30/2022 24 0 - 44 U/L Final   SGPT (ALT)  Date Value Ref Range Status  02/19/2015 19 U/L Final    Comment:    14-54 NOTE: New Reference Range  01/05/15          Passed - Completed PHQ-2 or PHQ-9 in the last 360 days      Passed - Valid encounter within last 6 months    Recent Outpatient Visits           5 months ago Seborrheic keratoses   [facility] [place], Marsa PARAS, DO

## 2024-06-30 ENCOUNTER — Other Ambulatory Visit: Payer: Self-pay | Admitting: Family Medicine

## 2024-06-30 DIAGNOSIS — F330 Major depressive disorder, recurrent, mild: Secondary | ICD-10-CM

## 2024-06-30 DIAGNOSIS — F411 Generalized anxiety disorder: Secondary | ICD-10-CM

## 2024-07-01 NOTE — Telephone Encounter (Signed)
 Requested Prescriptions  Refused Prescriptions Disp Refills   sertraline  (ZOLOFT ) 100 MG tablet [Pharmacy Med Name: SERTRALINE  HCL 100 MG TABLET] 135 tablet 0    Sig: TAKE 1.5 TABLET BY MOUTH DAILY     Psychiatry:  Antidepressants - SSRI - sertraline  Failed - 07/01/2024  2:10 PM      Failed - AST in normal range and within 360 days    AST  Date Value Ref Range Status  06/30/2022 26 15 - 41 U/L Final   SGOT(AST)  Date Value Ref Range Status  02/19/2015 21 U/L Final    Comment:    15-41 NOTE: New Reference Range  01/05/15          Failed - ALT in normal range and within 360 days    ALT  Date Value Ref Range Status  06/30/2022 24 0 - 44 U/L Final   SGPT (ALT)  Date Value Ref Range Status  02/19/2015 19 U/L Final    Comment:    14-54 NOTE: New Reference Range  01/05/15          Passed - Completed PHQ-2 or PHQ-9 in the last 360 days      Passed - Valid encounter within last 6 months    Recent Outpatient Visits           6 months ago Seborrheic keratoses   [place] [facility] [place], Marsa PARAS, DO

## 2024-07-08 ENCOUNTER — Encounter

## 2024-07-19 ENCOUNTER — Other Ambulatory Visit: Payer: Self-pay | Admitting: Family Medicine

## 2024-07-19 DIAGNOSIS — M545 Low back pain, unspecified: Secondary | ICD-10-CM

## 2024-07-19 DIAGNOSIS — M47816 Spondylosis without myelopathy or radiculopathy, lumbar region: Secondary | ICD-10-CM

## 2024-07-21 NOTE — Telephone Encounter (Signed)
 Requested medication (s) are due for refill today:   Provider to review  Requested medication (s) are on the active medication list:   Yes  Future visit scheduled:   No.   LOV 01/02/2024   Last ordered: 01/28/2024 #360, 1 refill  Non delegated refill     Requested Prescriptions  Pending Prescriptions Disp Refills   tizanidine  (ZANAFLEX ) 2 MG capsule [Pharmacy Med Name: tiZANidine  HCL 2 MG CAPSULE] 360 capsule 1    Sig: TAKE 1 TO 2 CAPSULES BY MOUTH 2 TIMES A DAY AS NEEDED FOR MUSCLE SPASMS     Not Delegated - Cardiovascular:  Alpha-2 Agonists - tizanidine  Failed - 07/21/2024 11:38 AM      Failed - This refill cannot be delegated      Passed - Valid encounter within last 6 months    Recent Outpatient Visits           6 months ago Seborrheic keratoses   [place] [facility] [place], Marsa PARAS, DO

## 2024-07-25 ENCOUNTER — Encounter

## 2024-08-14 ENCOUNTER — Other Ambulatory Visit: Payer: Self-pay | Admitting: Family Medicine

## 2024-08-14 DIAGNOSIS — I10 Essential (primary) hypertension: Secondary | ICD-10-CM

## 2024-08-18 ENCOUNTER — Other Ambulatory Visit: Payer: Self-pay

## 2024-08-18 NOTE — Telephone Encounter (Signed)
 Requested medications are due for refill today.  yes  Requested medications are on the active medications list.  yes  Last refill. 05/01/2024 #90 0 rf  Future visit scheduled.   Next year  Notes to clinic.  Labs are expired.    Requested Prescriptions  Pending Prescriptions Disp Refills   losartan  (COZAAR ) 100 MG tablet [Pharmacy Med Name: LOSARTAN  POTASSIUM 100 MG TAB] 90 tablet 0    Sig: TAKE 1 TABLET BY MOUTH DAILY     Cardiovascular:  Angiotensin Receptor Blockers Failed - 08/18/2024  1:52 PM      Failed - Cr in normal range and within 180 days    Creatinine  Date Value Ref Range Status  02/19/2015 0.46 mg/dL Final    Comment:    9.55-8.99 NOTE: New Reference Range  01/05/15    Creatinine, Ser  Date Value Ref Range Status  06/30/2022 0.70 0.44 - 1.00 mg/dL Final   Creatine, Serum  Date Value Ref Range Status  02/24/2015 0.90  Final         Failed - K in normal range and within 180 days    Potassium  Date Value Ref Range Status  06/30/2022 4.5 3.5 - 5.1 mmol/L Final  02/19/2015 4.0 mmol/L Final    Comment:    3.5-5.1 NOTE: New Reference Range  01/05/15          Passed - Patient is not pregnant      Passed - Last BP in normal range    BP Readings from Last 1 Encounters:  01/02/24 120/72         Passed - Valid encounter within last 6 months    Recent Outpatient Visits           7 months ago Seborrheic keratoses   [place] [facility] [place], Marsa PARAS, DO

## 2024-08-25 ENCOUNTER — Encounter: Payer: Self-pay | Admitting: Pharmacist

## 2024-08-25 NOTE — Progress Notes (Signed)
   08/25/2024  Patient ID: Heidi [name], female   DOB: 1941/05/09, 83 y.o.   MRN: [id number]  This patient is appearing on a report for the adherence measure for hypertension (ACEi/ARB) medications this calendar year.   Medication: losartan  100 mg Last fill date: 08/18/2024 for 90 day supply  Insurance report was not up to date. No action needed at this time.   Sharyle Sia, PharmD, [facility] [phone]

## 2024-09-01 ENCOUNTER — Ambulatory Visit
Admission: RE | Admit: 2024-09-01 | Discharge: 2024-09-01 | Disposition: A | Discharge status: Home / Self Care | Source: Ambulatory Visit | Attending: Oncology | Admitting: Oncology

## 2024-09-01 DIAGNOSIS — Z853 Personal history of malignant neoplasm of breast: Secondary | ICD-10-CM | POA: Diagnosis present

## 2024-09-01 DIAGNOSIS — Z1231 Encounter for screening mammogram for malignant neoplasm of breast: Secondary | ICD-10-CM | POA: Insufficient documentation

## 2024-09-01 DIAGNOSIS — M810 Age-related osteoporosis without current pathological fracture: Secondary | ICD-10-CM | POA: Diagnosis present

## 2024-09-15 ENCOUNTER — Other Ambulatory Visit: Payer: Self-pay | Admitting: Family Medicine

## 2024-09-15 DIAGNOSIS — F330 Major depressive disorder, recurrent, mild: Secondary | ICD-10-CM

## 2024-09-15 DIAGNOSIS — F411 Generalized anxiety disorder: Secondary | ICD-10-CM

## 2024-09-16 NOTE — Telephone Encounter (Signed)
 Requested medications are due for refill today.  yes  Requested medications are on the active medications list.  yes  Last refill. 05/2024  Future visit scheduled.   no  Notes to clinic.  Pt is over due for follow up on chronic conditions. Labs are expired.    Requested Prescriptions  Pending Prescriptions Disp Refills   busPIRone  (BUSPAR ) 30 MG tablet [Pharmacy Med Name: busPIRone  HCL 30 MG TABLET] 180 tablet 0    Sig: TAKE 2 TABLETS BY MOUTH DAILY     Psychiatry: Anxiolytics/Hypnotics - Non-controlled Passed - 09/16/2024  4:57 PM      Passed - Valid encounter within last 12 months    Recent Outpatient Visits           8 months ago Seborrheic keratoses   [facility] [place], Marsa PARAS, DO               sertraline  (ZOLOFT ) 100 MG tablet [Pharmacy Med Name: SERTRALINE  HCL 100 MG TABLET] 135 tablet 0    Sig: TAKE 1.5 TABLET BY MOUTH DAILY     Psychiatry:  Antidepressants - SSRI - sertraline  Failed - 09/16/2024  4:57 PM      Failed - AST in normal range and within 360 days    AST  Date Value Ref Range Status  06/30/2022 26 15 - 41 U/L Final   SGOT(AST)  Date Value Ref Range Status  02/19/2015 21 U/L Final    Comment:    15-41 NOTE: New Reference Range  01/05/15          Failed - ALT in normal range and within 360 days    ALT  Date Value Ref Range Status  06/30/2022 24 0 - 44 U/L Final   SGPT (ALT)  Date Value Ref Range Status  02/19/2015 19 U/L Final    Comment:    14-54 NOTE: New Reference Range  01/05/15          Passed - Completed PHQ-2 or PHQ-9 in the last 360 days      Passed - Valid encounter within last 6 months    Recent Outpatient Visits           8 months ago Seborrheic keratoses    [facility] [place], Marsa PARAS, DO

## 2024-11-11 ENCOUNTER — Ambulatory Visit

## 2024-11-11 DIAGNOSIS — Z23 Encounter for immunization: Secondary | ICD-10-CM

## 2024-11-22 ENCOUNTER — Other Ambulatory Visit: Payer: Self-pay | Admitting: Family Medicine

## 2024-11-22 DIAGNOSIS — I10 Essential (primary) hypertension: Secondary | ICD-10-CM

## 2024-11-24 NOTE — Telephone Encounter (Signed)
 Requested medication (s) are due for refill today - yes  Requested medication (s) are on the active medication list -yes  Future visit scheduled -no  Last refill: 08/18/24 #90   Notes to clinic: fails lab protocol- over 1 year 06/30/22, visit protocol  Requested Prescriptions  Pending Prescriptions Disp Refills   losartan  (COZAAR ) 100 MG tablet [Pharmacy Med Name: LOSARTAN  POTASSIUM 100 MG TAB] 90 tablet 0    Sig: TAKE 1 TABLET BY MOUTH DAILY     Cardiovascular:  Angiotensin Receptor Blockers Failed - 11/24/2024  1:41 PM      Failed - Cr in normal range and within 180 days    Creatinine  Date Value Ref Range Status  02/19/2015 0.46 mg/dL Final    Comment:    9.55-8.99 NOTE: New Reference Range  01/05/15    Creatinine, Ser  Date Value Ref Range Status  06/30/2022 0.70 0.44 - 1.00 mg/dL Final   Creatine, Serum  Date Value Ref Range Status  02/24/2015 0.90  Final         Failed - K in normal range and within 180 days    Potassium  Date Value Ref Range Status  06/30/2022 4.5 3.5 - 5.1 mmol/L Final  02/19/2015 4.0 mmol/L Final    Comment:    3.5-5.1 NOTE: New Reference Range  01/05/15          Failed - Valid encounter within last 6 months    Recent Outpatient Visits           10 months ago Seborrheic keratoses   [place] [facility] [place] Marsa PARAS, [place]              Passed - Patient is not pregnant      Passed - Last BP in normal range    BP Readings from Last 1 Encounters:  01/02/24 120/72            Requested Prescriptions  Pending Prescriptions Disp Refills   losartan  (COZAAR ) 100 MG tablet [Pharmacy Med Name: LOSARTAN  POTASSIUM 100 MG TAB] 90 tablet 0    Sig: TAKE 1 TABLET BY MOUTH DAILY     Cardiovascular:  Angiotensin Receptor Blockers Failed - 11/24/2024  1:41 PM      Failed - Cr in normal range and within 180 days    Creatinine  Date Value Ref Range Status  02/19/2015 0.46 mg/dL Final    Comment:     9.55-8.99 NOTE: New Reference Range  01/05/15    Creatinine, Ser  Date Value Ref Range Status  06/30/2022 0.70 0.44 - 1.00 mg/dL Final   Creatine, Serum  Date Value Ref Range Status  02/24/2015 0.90  Final         Failed - K in normal range and within 180 days    Potassium  Date Value Ref Range Status  06/30/2022 4.5 3.5 - 5.1 mmol/L Final  02/19/2015 4.0 mmol/L Final    Comment:    3.5-5.1 NOTE: New Reference Range  01/05/15          Failed - Valid encounter within last 6 months    Recent Outpatient Visits           10 months ago Seborrheic keratoses   [place] [facility] [place], [place]              Passed - Patient is not pregnant      Passed - Last BP in normal range  BP Readings from Last 1 Encounters:  01/02/24 120/72

## 2024-11-24 NOTE — Telephone Encounter (Signed)
 Needs appointment

## 2024-12-31 ENCOUNTER — Ambulatory Visit: Admitting: Oncology

## 2025-05-22 ENCOUNTER — Ambulatory Visit

## 2025-05-27 ENCOUNTER — Ambulatory Visit
